# Patient Record
Sex: Female | Born: 1980 | Race: Black or African American | Hispanic: No | Marital: Single | Smoking: Former smoker
Health system: Southern US, Community
[De-identification: ages and names within clinical notes are randomized; demographics above are authoritative.]

## PROBLEM LIST (undated history)

## (undated) DIAGNOSIS — E11610 Type 2 diabetes mellitus with diabetic neuropathic arthropathy: Secondary | ICD-10-CM

## (undated) DIAGNOSIS — I10 Essential (primary) hypertension: Secondary | ICD-10-CM

## (undated) DIAGNOSIS — E785 Hyperlipidemia, unspecified: Secondary | ICD-10-CM

## (undated) DIAGNOSIS — F319 Bipolar disorder, unspecified: Secondary | ICD-10-CM

## (undated) DIAGNOSIS — F1210 Cannabis abuse, uncomplicated: Secondary | ICD-10-CM

## (undated) DIAGNOSIS — E872 Acidosis: Secondary | ICD-10-CM

## (undated) DIAGNOSIS — R112 Nausea with vomiting, unspecified: Secondary | ICD-10-CM

## (undated) DIAGNOSIS — E271 Primary adrenocortical insufficiency: Secondary | ICD-10-CM

## (undated) DIAGNOSIS — Z9889 Other specified postprocedural states: Secondary | ICD-10-CM

## (undated) DIAGNOSIS — E119 Type 2 diabetes mellitus without complications: Secondary | ICD-10-CM

## (undated) DIAGNOSIS — K3184 Gastroparesis: Secondary | ICD-10-CM

## (undated) DIAGNOSIS — E8729 Other acidosis: Secondary | ICD-10-CM

## (undated) HISTORY — DX: Essential (primary) hypertension: I10

## (undated) HISTORY — DX: Type 2 diabetes mellitus with diabetic neuropathic arthropathy: E11.610

## (undated) HISTORY — PX: ABDOMINAL HYSTERECTOMY: SHX81

## (undated) HISTORY — PX: ABCESS DRAINAGE: SHX399

## (undated) HISTORY — DX: Hyperlipidemia, unspecified: E78.5

---
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
## 2005-07-19 ENCOUNTER — Emergency Department: Payer: Self-pay | Admitting: Emergency Medicine

## 2009-08-20 DIAGNOSIS — F319 Bipolar disorder, unspecified: Secondary | ICD-10-CM

## 2009-08-20 DIAGNOSIS — E271 Primary adrenocortical insufficiency: Secondary | ICD-10-CM

## 2009-08-20 HISTORY — DX: Primary adrenocortical insufficiency: E27.1

## 2009-08-20 HISTORY — DX: Bipolar disorder, unspecified: F31.9

## 2010-12-01 ENCOUNTER — Inpatient Hospital Stay: Payer: Self-pay | Admitting: Internal Medicine

## 2010-12-06 LAB — PATHOLOGY REPORT

## 2012-08-18 ENCOUNTER — Ambulatory Visit: Payer: Self-pay | Admitting: Emergency Medicine

## 2012-08-18 LAB — URINALYSIS, COMPLETE
Bilirubin,UR: NEGATIVE
Blood: NEGATIVE
Leukocyte Esterase: NEGATIVE
Nitrite: NEGATIVE
Specific Gravity: 1.02 (ref 1.003–1.030)

## 2013-02-22 ENCOUNTER — Ambulatory Visit: Payer: Self-pay

## 2015-01-07 ENCOUNTER — Other Ambulatory Visit: Payer: Self-pay | Admitting: Family Medicine

## 2015-01-31 ENCOUNTER — Other Ambulatory Visit: Payer: Self-pay | Admitting: Family Medicine

## 2015-01-31 ENCOUNTER — Ambulatory Visit
Admission: RE | Admit: 2015-01-31 | Discharge: 2015-01-31 | Disposition: A | Discharge status: Home / Self Care | Payer: Medicare Other | Source: Ambulatory Visit | Attending: Family Medicine | Admitting: Family Medicine

## 2015-01-31 DIAGNOSIS — M7989 Other specified soft tissue disorders: Secondary | ICD-10-CM | POA: Diagnosis present

## 2015-01-31 DIAGNOSIS — R600 Localized edema: Secondary | ICD-10-CM | POA: Insufficient documentation

## 2015-01-31 DIAGNOSIS — R609 Edema, unspecified: Secondary | ICD-10-CM

## 2015-01-31 DIAGNOSIS — R52 Pain, unspecified: Secondary | ICD-10-CM

## 2015-01-31 DIAGNOSIS — M79604 Pain in right leg: Secondary | ICD-10-CM | POA: Diagnosis present

## 2015-02-04 ENCOUNTER — Encounter: Payer: Medicare Other | Admitting: *Deleted

## 2015-03-15 ENCOUNTER — Inpatient Hospital Stay: Payer: Medicare Other

## 2015-03-15 ENCOUNTER — Inpatient Hospital Stay
Admission: EM | Admit: 2015-03-15 | Discharge: 2015-03-18 | DRG: 638 | Disposition: A | Discharge status: Home / Self Care | Payer: Medicare Other | Attending: Internal Medicine | Admitting: Internal Medicine

## 2015-03-15 DIAGNOSIS — F319 Bipolar disorder, unspecified: Secondary | ICD-10-CM | POA: Diagnosis present

## 2015-03-15 DIAGNOSIS — F1210 Cannabis abuse, uncomplicated: Secondary | ICD-10-CM | POA: Diagnosis present

## 2015-03-15 DIAGNOSIS — K3184 Gastroparesis: Secondary | ICD-10-CM | POA: Diagnosis present

## 2015-03-15 DIAGNOSIS — Z79899 Other long term (current) drug therapy: Secondary | ICD-10-CM | POA: Diagnosis not present

## 2015-03-15 DIAGNOSIS — B9620 Unspecified Escherichia coli [E. coli] as the cause of diseases classified elsewhere: Secondary | ICD-10-CM | POA: Diagnosis present

## 2015-03-15 DIAGNOSIS — Z452 Encounter for adjustment and management of vascular access device: Secondary | ICD-10-CM

## 2015-03-15 DIAGNOSIS — E1169 Type 2 diabetes mellitus with other specified complication: Secondary | ICD-10-CM

## 2015-03-15 DIAGNOSIS — E1310 Other specified diabetes mellitus with ketoacidosis without coma: Secondary | ICD-10-CM | POA: Diagnosis present

## 2015-03-15 DIAGNOSIS — N390 Urinary tract infection, site not specified: Secondary | ICD-10-CM | POA: Diagnosis present

## 2015-03-15 DIAGNOSIS — E1110 Type 2 diabetes mellitus with ketoacidosis without coma: Secondary | ICD-10-CM | POA: Diagnosis present

## 2015-03-15 HISTORY — DX: Bipolar disorder, unspecified: F31.9

## 2015-03-15 HISTORY — DX: Gastroparesis: K31.84

## 2015-03-15 HISTORY — DX: Type 2 diabetes mellitus without complications: E11.9

## 2015-03-15 HISTORY — DX: Cannabis abuse, uncomplicated: F12.10

## 2015-03-15 LAB — COMPREHENSIVE METABOLIC PANEL
ALT: 15 U/L (ref 14–54)
AST: 19 U/L (ref 15–41)
Albumin: 3.6 g/dL (ref 3.5–5.0)
Alkaline Phosphatase: 82 U/L (ref 38–126)
Anion gap: 17 — ABNORMAL HIGH (ref 5–15)
BUN: 12 mg/dL (ref 6–20)
CO2: 20 mmol/L — ABNORMAL LOW (ref 22–32)
CREATININE: 0.88 mg/dL (ref 0.44–1.00)
Calcium: 9 mg/dL (ref 8.9–10.3)
Chloride: 98 mmol/L — ABNORMAL LOW (ref 101–111)
GFR calc Af Amer: >60 mL/min (ref >60)
GFR calc non Af Amer: >60 mL/min (ref >60)
GLUCOSE: 377 mg/dL — AB (ref 65–99)
Potassium: 4.1 mmol/L (ref 3.5–5.1)
Sodium: 135 mmol/L (ref 135–145)
Total Bilirubin: 0.8 mg/dL (ref 0.3–1.2)
Total Protein: 7.9 g/dL (ref 6.5–8.1)

## 2015-03-15 LAB — BASIC METABOLIC PANEL
ANION GAP: 19 — AB (ref 5–15)
ANION GAP: 11 (ref 5–15)
BUN: 12 mg/dL (ref 6–20)
BUN: 12 mg/dL (ref 6–20)
CHLORIDE: 100 mmol/L — AB (ref 101–111)
CO2: 17 mmol/L — AB (ref 22–32)
CO2: 21 mmol/L — ABNORMAL LOW (ref 22–32)
Calcium: 9 mg/dL (ref 8.9–10.3)
Calcium: 8.8 mg/dL — ABNORMAL LOW (ref 8.9–10.3)
Chloride: 108 mmol/L (ref 101–111)
Creatinine, Ser: 0.87 mg/dL (ref 0.44–1.00)
Creatinine, Ser: 0.89 mg/dL (ref 0.44–1.00)
GFR calc Af Amer: >60 mL/min (ref >60)
GFR calc non Af Amer: >60 mL/min (ref >60)
GLUCOSE: 386 mg/dL — AB (ref 65–99)
Glucose, Bld: 242 mg/dL — ABNORMAL HIGH (ref 65–99)
POTASSIUM: 4.2 mmol/L (ref 3.5–5.1)
Potassium: 4.3 mmol/L (ref 3.5–5.1)
SODIUM: 136 mmol/L (ref 135–145)
Sodium: 140 mmol/L (ref 135–145)

## 2015-03-15 LAB — BLOOD GAS, VENOUS
Acid-base deficit: 6.2 mmol/L — ABNORMAL HIGH (ref 0.0–2.0)
Bicarbonate: 19 mEq/L — ABNORMAL LOW (ref 21.0–28.0)
FIO2: 0.21
Patient temperature: 37
pCO2, Ven: 36 mmHg — ABNORMAL LOW (ref 44.0–60.0)
pH, Ven: 7.33 (ref 7.320–7.430)

## 2015-03-15 LAB — URINALYSIS COMPLETE WITH MICROSCOPIC (ARMC ONLY)
Bacteria, UA: NONE SEEN
Bilirubin Urine: NEGATIVE
Glucose, UA: >500 mg/dL — AB
Nitrite: NEGATIVE
Protein, ur: 30 mg/dL — AB
Specific Gravity, Urine: 1.017 (ref 1.005–1.030)
pH: 7 (ref 5.0–8.0)

## 2015-03-15 LAB — CBC
HEMATOCRIT: 38.1 % (ref 35.0–47.0)
Hemoglobin: 12.5 g/dL (ref 12.0–16.0)
MCH: 28.9 pg (ref 26.0–34.0)
MCHC: 32.8 g/dL (ref 32.0–36.0)
MCV: 88.1 fL (ref 80.0–100.0)
Platelets: 293 10*3/uL (ref 150–440)
RBC: 4.33 MIL/uL (ref 3.80–5.20)
RDW: 12.9 % (ref 11.5–14.5)
WBC: 18.9 10*3/uL — ABNORMAL HIGH (ref 3.6–11.0)

## 2015-03-15 LAB — URINE DRUG SCREEN, QUALITATIVE (ARMC ONLY)
AMPHETAMINES, UR SCREEN: NOT DETECTED
BARBITURATES, UR SCREEN: NOT DETECTED
Benzodiazepine, Ur Scrn: NOT DETECTED
Cannabinoid 50 Ng, Ur ~~LOC~~: POSITIVE — AB
Cocaine Metabolite,Ur ~~LOC~~: NOT DETECTED
MDMA (Ecstasy)Ur Screen: NOT DETECTED
METHADONE SCREEN, URINE: NOT DETECTED
Opiate, Ur Screen: NOT DETECTED
PHENCYCLIDINE (PCP) UR S: NOT DETECTED
Tricyclic, Ur Screen: NOT DETECTED

## 2015-03-15 LAB — GLUCOSE, CAPILLARY
GLUCOSE-CAPILLARY: 197 mg/dL — AB (ref 65–99)
GLUCOSE-CAPILLARY: 320 mg/dL — AB (ref 65–99)
Glucose-Capillary: 348 mg/dL — ABNORMAL HIGH (ref 65–99)
Glucose-Capillary: 373 mg/dL — ABNORMAL HIGH (ref 65–99)
Glucose-Capillary: 340 mg/dL — ABNORMAL HIGH (ref 65–99)
Glucose-Capillary: 261 mg/dL — ABNORMAL HIGH (ref 65–99)
Glucose-Capillary: 236 mg/dL — ABNORMAL HIGH (ref 65–99)
Glucose-Capillary: 215 mg/dL — ABNORMAL HIGH (ref 65–99)

## 2015-03-15 LAB — LACTIC ACID, PLASMA
Lactic Acid, Venous: 1.6 mmol/L (ref 0.5–2.0)
Lactic Acid, Venous: 2.1 mmol/L (ref 0.5–2.0)

## 2015-03-15 LAB — LIPASE, BLOOD: Lipase: 15 U/L — ABNORMAL LOW (ref 22–51)

## 2015-03-15 LAB — PREGNANCY, URINE: Preg Test, Ur: NEGATIVE

## 2015-03-15 MED ORDER — SODIUM CHLORIDE 0.9 % IJ SOLN
3.0000 mL | Freq: Two times a day (BID) | INTRAMUSCULAR | Status: DC
  Administered 2015-03-15 – 2015-03-17 (×6): 3 mL via INTRAVENOUS

## 2015-03-15 MED ORDER — SODIUM CHLORIDE 0.9 % IV SOLN
1000.0000 mL | Freq: Once | INTRAVENOUS | Status: AC
  Administered 2015-03-15: 1000 mL via INTRAVENOUS

## 2015-03-15 MED ORDER — LISINOPRIL 5 MG PO TABS
5.0000 mg | ORAL_TABLET | Freq: Every day | ORAL | Status: DC
Start: 2015-03-16 — End: 2015-03-18
  Administered 2015-03-17: 5 mg via ORAL
  Filled 2015-03-15: qty 1

## 2015-03-15 MED ORDER — DEXTROSE 5 % IV SOLN
1.0000 g | INTRAVENOUS | Status: DC
  Administered 2015-03-16 – 2015-03-18 (×3): 1 g via INTRAVENOUS
  Filled 2015-03-15 (×5): qty 10

## 2015-03-15 MED ORDER — CIPROFLOXACIN HCL 500 MG PO TABS: 500.0000 mg | ORAL_TABLET | Freq: Two times a day (BID) | ORAL | Status: DC

## 2015-03-15 MED ORDER — OXYCODONE HCL 5 MG PO TABS
5.0000 mg | ORAL_TABLET | ORAL | Status: DC | PRN
  Administered 2015-03-16: 5 mg via ORAL
  Filled 2015-03-15: qty 1

## 2015-03-15 MED ORDER — SODIUM CHLORIDE 0.9 % IV BOLUS (SEPSIS)
1000.0000 mL | INTRAVENOUS | Status: AC
  Administered 2015-03-15: 1000 mL via INTRAVENOUS

## 2015-03-15 MED ORDER — SODIUM CHLORIDE 0.9 % IV BOLUS (SEPSIS)
1000.0000 mL | Freq: Once | INTRAVENOUS | Status: AC
  Administered 2015-03-15: 1000 mL via INTRAVENOUS

## 2015-03-15 MED ORDER — DEXTROSE-NACL 5-0.45 % IV SOLN
INTRAVENOUS | Status: DC
  Administered 2015-03-15: 22:00:00 via INTRAVENOUS

## 2015-03-15 MED ORDER — ACETAMINOPHEN 325 MG PO TABS
650.0000 mg | ORAL_TABLET | Freq: Four times a day (QID) | ORAL | Status: DC | PRN
Start: 2015-03-15 — End: 2015-03-18

## 2015-03-15 MED ORDER — LORAZEPAM 2 MG/ML IJ SOLN
0.5000 mg | Freq: Once | INTRAMUSCULAR | Status: AC
  Administered 2015-03-15: 0.5 mg via INTRAVENOUS
  Filled 2015-03-15: qty 1

## 2015-03-15 MED ORDER — METOCLOPRAMIDE HCL 5 MG/ML IJ SOLN
10.0000 mg | Freq: Once | INTRAMUSCULAR | Status: AC
  Administered 2015-03-15 (×2): 10 mg via INTRAVENOUS
  Filled 2015-03-15: qty 2

## 2015-03-15 MED ORDER — METOCLOPRAMIDE HCL 5 MG/ML IJ SOLN
INTRAMUSCULAR | Status: AC
  Administered 2015-03-15: 10 mg via INTRAVENOUS
  Filled 2015-03-15: qty 2

## 2015-03-15 MED ORDER — ACETAMINOPHEN 650 MG RE SUPP
650.0000 mg | Freq: Four times a day (QID) | RECTAL | Status: DC | PRN
Start: 2015-03-15 — End: 2015-03-18

## 2015-03-15 MED ORDER — METOCLOPRAMIDE HCL 5 MG/ML IJ SOLN: 10.0000 mg | Freq: Once | INTRAMUSCULAR | Status: AC

## 2015-03-15 MED ORDER — MORPHINE SULFATE 2 MG/ML IJ SOLN
2.0000 mg | INTRAMUSCULAR | Status: DC | PRN
  Administered 2015-03-15 – 2015-03-17 (×9): 2 mg via INTRAVENOUS
  Filled 2015-03-15 (×9): qty 1

## 2015-03-15 MED ORDER — METOCLOPRAMIDE HCL 5 MG/ML IJ SOLN
10.0000 mg | Freq: Four times a day (QID) | INTRAMUSCULAR | Status: DC | PRN
  Administered 2015-03-15 – 2015-03-17 (×4): 10 mg via INTRAVENOUS
  Filled 2015-03-15 (×4): qty 2

## 2015-03-15 MED ORDER — CEFTRIAXONE SODIUM 1 G IJ SOLR
1.0000 g | Freq: Once | INTRAMUSCULAR | Status: AC
  Administered 2015-03-15: 1 g via INTRAVENOUS

## 2015-03-15 MED ORDER — SODIUM CHLORIDE 0.9 % IV SOLN
INTRAVENOUS | Status: DC
  Administered 2015-03-15: 17:00:00 via INTRAVENOUS

## 2015-03-15 MED ORDER — SODIUM CHLORIDE 0.9 % IV SOLN
INTRAVENOUS | Status: DC
  Administered 2015-03-15: 18:00:00 via INTRAVENOUS

## 2015-03-15 MED ORDER — SODIUM CHLORIDE 0.9 % IV SOLN: INTRAVENOUS | Status: DC

## 2015-03-15 MED ORDER — ENOXAPARIN SODIUM 40 MG/0.4ML ~~LOC~~ SOLN
40.0000 mg | Freq: Two times a day (BID) | SUBCUTANEOUS | Status: DC
  Administered 2015-03-15 – 2015-03-18 (×5): 40 mg via SUBCUTANEOUS
  Filled 2015-03-15 (×5): qty 0.4

## 2015-03-15 MED ORDER — NORELGESTROMIN-ETH ESTRADIOL 150-35 MCG/24HR TD PTWK: 1.0000 | MEDICATED_PATCH | TRANSDERMAL | Status: DC

## 2015-03-15 MED ORDER — POTASSIUM CHLORIDE 10 MEQ/100ML IV SOLN
10.0000 meq | INTRAVENOUS | Status: AC
  Administered 2015-03-15 (×3): 10 meq via INTRAVENOUS
  Filled 2015-03-15 (×4): qty 100

## 2015-03-15 MED ORDER — SODIUM CHLORIDE 0.9 % IV SOLN
INTRAVENOUS | Status: DC
  Administered 2015-03-15: 17:00:00 via INTRAVENOUS
  Administered 2015-03-16: 5.1 [IU]/h via INTRAVENOUS
  Filled 2015-03-15: qty 2.5

## 2015-03-15 MED ORDER — ONDANSETRON 4 MG PO TBDP: 4.0000 mg | ORAL_TABLET | Freq: Three times a day (TID) | ORAL | Status: DC | PRN

## 2015-03-15 MED ORDER — DEXTROSE 5 % IV SOLN
INTRAVENOUS | Status: AC
  Administered 2015-03-15: 1 g via INTRAVENOUS
  Filled 2015-03-15: qty 10

## 2015-03-15 NOTE — Discharge Instructions (Signed)

## 2015-03-15 NOTE — Progress Notes (Signed)
Central line cleared for use at per Dr. Juliann Pulse.

## 2015-03-15 NOTE — Progress Notes (Signed)
Patient with limited IV access.  Dr. Elpidio Anis notified.

## 2015-03-15 NOTE — H&P (Signed)
[facility] - [place] at [facility]   PATIENT NAME: Laura Escobar    MR#:  [id number]  DATE OF BIRTH:  [birth year]  DATE OF ADMISSION:  03/15/2015  PRIMARY CARE PHYSICIAN: Berneice Gandy, MD   REQUESTING/REFERRING PHYSICIAN: Dr. Cyril Loosen  CHIEF COMPLAINT:   Chief Complaint  Patient presents with  . Abdominal Pain    Nausea/vomiting/cramps. Patient states she "started metformin on friday and it set off my gastroparesis"    HISTORY OF PRESENT ILLNESS:  Melainie Krinsky  is a 34 y.o. female with a known history of diabetes mellitus type 2 requiring insulin, gastroparesis, bipolar disorder presents to the emergency room with complaints of nausea, vomiting, abdominal pain since Thursday. Patient mentions that this feels very similar to an year prior when she had gastroparesis attack and was admitted to [facility]. She has done well since then. She stopped taking her insulin since Thursday as she has been unable to eat. Blood sugars have been running high. In the emergency room she has been found to have a UTI, DKA and is being admitted to the hospital. She normally takes 25 units of NPH 2 times a day. And 6 units of regular insulin with meals.  PAST MEDICAL HISTORY:   Past Medical History  Diagnosis Date  . Gastroparesis   . Diabetes mellitus without complication   . Bipolar 1 disorder   . Marijuana abuse     PAST SURGICAL HISTORY:  History reviewed. No pertinent past surgical history.  SOCIAL HISTORY:   History  Substance Use Topics  . Smoking status: Not on file  . Smokeless tobacco: Not on file  . Alcohol Use: Not on file    FAMILY HISTORY:   Family History  Problem Relation Age of Onset  . Diabetes Mother     DRUG ALLERGIES:  No Known Allergies  REVIEW OF SYSTEMS:   Review of Systems  Constitutional: Positive for malaise/fatigue. Negative for fever, chills and weight loss.  HENT: Negative for hearing loss and nosebleeds.    Eyes: Negative for blurred vision, double vision and pain.  Respiratory: Negative for cough, hemoptysis, sputum production, shortness of breath and wheezing.   Cardiovascular: Negative for chest pain, palpitations, orthopnea and leg swelling.  Gastrointestinal: Positive for nausea, vomiting and abdominal pain. Negative for diarrhea and constipation.  Genitourinary: Negative for dysuria and hematuria.  Musculoskeletal: Negative for myalgias, back pain and falls.  Skin: Negative for rash.  Neurological: Positive for weakness. Negative for dizziness, tremors, sensory change, speech change, focal weakness, seizures and headaches.  Endo/Heme/Allergies: Does not bruise/bleed easily.  Psychiatric/Behavioral: Positive for depression. Negative for memory loss. The patient is nervous/anxious.     MEDICATIONS AT HOME:   Prior to Admission medications   Medication Sig Start Date End Date Taking? Authorizing Provider  canagliflozin (INVOKANA) 100 MG TABS tablet Take 100 mg by mouth daily.   Yes Historical Provider, MD  furosemide (LASIX) 40 MG tablet Take 60 mg by mouth daily.   Yes Historical Provider, MD  glimepiride (AMARYL) 1 MG tablet Take 1 mg by mouth daily with breakfast.   Yes Historical Provider, MD  insulin NPH Human (HUMULIN N,NOVOLIN N) 100 UNIT/ML injection Inject 27 Units into the skin daily before breakfast.   Yes Historical Provider, MD  insulin NPH Human (HUMULIN N,NOVOLIN N) 100 UNIT/ML injection Inject 25 Units into the skin every evening.   Yes Historical Provider, MD  insulin regular (NOVOLIN R,HUMULIN R) 100 units/mL injection Inject 6 Units into the  skin 2 (two) times daily before a meal. Inject 6 units 30 minutes before lunch and dinner. May adjust as needed depending on blood sugar.   Yes Historical Provider, MD  lisinopril (PRINIVIL,ZESTRIL) 5 MG tablet Take 5 mg by mouth daily.   Yes Historical Provider, MD  norelgestromin-ethinyl estradiol (ORTHO EVRA) 150-35 MCG/24HR  transdermal patch Place 1 patch onto the skin once a week.   Yes Historical Provider, MD  ciprofloxacin (CIPRO) 500 MG tablet Take 1 tablet (500 mg total) by mouth 2 (two) times daily. 03/15/15   Jene Every, MD  ondansetron (ZOFRAN ODT) 4 MG disintegrating tablet Take 1 tablet (4 mg total) by mouth every 8 (eight) hours as needed for nausea or vomiting. 03/15/15   Jene Every, MD      VITAL SIGNS:  Blood pressure 183/97, pulse 98, temperature 97.9 F (36.6 C), temperature source Oral, resp. rate 26, height 5\' 7"  (1.702 m), weight 139.708 kg (308 lb), last menstrual period 02/28/2015, SpO2 98 %.  PHYSICAL EXAMINATION:  Physical Exam  GENERAL:  33 y.o.-year-old patient lying in the bed with no acute distress.  EYES: Pupils equal, round, reactive to light and accommodation. No scleral icterus. Extraocular muscles intact.  HEENT: Head atraumatic, normocephalic. Oropharynx and nasopharynx clear. No oropharyngeal erythema, Dry oral mucosa  NECK:  Supple, no jugular venous distention. No thyroid enlargement, no tenderness.  LUNGS: Normal breath sounds bilaterally, no wheezing, rales, rhonchi. No use of accessory muscles of respiration.  CARDIOVASCULAR: S1, S2 normal. No murmurs, rubs, or gallops. Tachycardia. ABDOMEN: Soft, nondistended. Bowel sounds present. No organomegaly or mass. Diffuse tenderness. EXTREMITIES: No pedal edema, cyanosis, or clubbing. + 2 pedal & radial pulses b/l.   NEUROLOGIC: Cranial nerves II through XII are intact. No focal Motor or sensory deficits appreciated b/l PSYCHIATRIC: The patient is alert and oriented x 3. Good affect.  SKIN: No obvious rash, lesion, or ulcer.   LABORATORY PANEL:   CBC  Recent Labs Lab 03/15/15 1230  WBC 18.9*  HGB 12.5  HCT 38.1  PLT 293   ------------------------------------------------------------------------------------------------------------------  Chemistries   Recent Labs Lab 03/15/15 1230  NA 135  K 4.1  CL 98*   CO2 20*  GLUCOSE 377*  BUN 12  CREATININE 0.88  CALCIUM 9.0  AST 19  ALT 15  ALKPHOS 82  BILITOT 0.8   ------------------------------------------------------------------------------------------------------------------  Cardiac Enzymes No results for input(s): TROPONINI in the last 168 hours. ------------------------------------------------------------------------------------------------------------------  RADIOLOGY:  No results found.   IMPRESSION AND PLAN:   * Diabetic ketoacidosis Due to patient missing her insulin for 4 days now. Bolus normal saline. Continue maintenance normal saline. Start on IV insulin with every hour Accu-Cheks. Admit to stepdown unit. We'll transition to D5 normal saline once blood sugars are less than 250. Check BMP every 4 hours. We will transition to her home dose of insulin once her DKA has resolved. Nothing by mouth. Counseled to not stop her insulin in the future to prevent DKA.  * UTI with sepsis Start IV ceftriaxone. We wait for urine cultures. Likely cause of onset of the DKA. Patient receiving IV fluid boluses. Check lactic acid.  * Gastroparesis Start IV Reglan.  * DVT prophylaxis with Lovenox.  All the records are reviewed and case discussed with ED provider. Management plans discussed with the patient, family and they are in agreement.  CODE STATUS: FULL  TOTAL CC TIME TAKING CARE OF THIS PATIENT: 40 minutes.    Milagros Loll R M.D on 03/15/2015 at 3:29 PM  Between 7am to 6pm - Pager - [phone]  After 6pm go to [email or web address] - password [email or web address] [facility]  [facility]  Office  [phone]  CC: Primary care physician; Berneice Gandy, MD

## 2015-03-15 NOTE — Op Note (Signed)
Preoperative diagnosis: DKA Postoperative diagnosis: DKA  Procedure performed: Ultrasound guided right internal jugular vein central line placement, posterior approach Anesthesia: none Complications: None  Indication for surgery:  Ms. Grieshop is a pleasant 34 yo F who presented with DKA.  I was consulted for central line placement.   Details of procedure:  Informed consent was obtained.  Right neck was prepped and draped.  Timeout was performed.  An ultrasound was used to visualize the right IJ vein.  It was easily collapsable due to patient volume status and I was able to isolate it using a posterior approach with the access needle.  The wire was then passed through the needle without difficulty.  The tract was dilated up and the central venous catheter was placed over the wire.  The wire was withdrawn.  All 3 ports drew and flushed easily.  The IV was sutured in place and a sterile dressing was placed over it.  There were no immediate complications.  Needle, sponge and instrument count was correct at the end of the procedure.  Awaiting chest xray to confirm placement without complication.

## 2015-03-15 NOTE — ED Provider Notes (Signed)
[facility] Emergency Department Provider Note  ____________________________________________  Time seen: On arrival, via EMS  I have reviewed the triage vital signs and the nursing notes.   HISTORY  Chief Complaint Abdominal Pain    HPI Laura Escobar is a 34 y.o. female who presents with crampy abdominal pain diffusely and nausea and vomiting. Patient was sent from her PCPs office via EMS for evaluation. Patient wears a history of gastroparesis and she says this feels similar. She complains of pain is moderate and the nausea and vomiting as the most severe. She denies diarrhea. No rigors or chills, no fever. As episode feels similar to past episodes. She reports Reglan helps with her nausea typically.     Past Medical History  Diagnosis Date  . Gastroparesis   . Diabetes mellitus without complication   . Bipolar 1 disorder     There are no active problems to display for this patient.   No past surgical history on file.  Current Outpatient Rx  Name  Route  Sig  Dispense  Refill  . canagliflozin (INVOKANA) 100 MG TABS tablet   Oral   Take 100 mg by mouth daily.         . furosemide (LASIX) 40 MG tablet   Oral   Take 60 mg by mouth daily.         Marland Kitchen glimepiride (AMARYL) 1 MG tablet   Oral   Take 1 mg by mouth daily with breakfast.         . insulin NPH Human (HUMULIN N,NOVOLIN N) 100 UNIT/ML injection   Subcutaneous   Inject 27 Units into the skin daily before breakfast.         . insulin NPH Human (HUMULIN N,NOVOLIN N) 100 UNIT/ML injection   Subcutaneous   Inject 25 Units into the skin every evening.         . insulin regular (NOVOLIN R,HUMULIN R) 100 units/mL injection   Subcutaneous   Inject 6 Units into the skin 2 (two) times daily before a meal. Inject 6 units 30 minutes before lunch and dinner. May adjust as needed depending on blood sugar.         . lisinopril (PRINIVIL,ZESTRIL) 5 MG tablet   Oral   Take 5 mg by  mouth daily.         . norelgestromin-ethinyl estradiol (ORTHO EVRA) 150-35 MCG/24HR transdermal patch   Transdermal   Place 1 patch onto the skin once a week.         . ciprofloxacin (CIPRO) 500 MG tablet   Oral   Take 1 tablet (500 mg total) by mouth 2 (two) times daily.   14 tablet   0   . ondansetron (ZOFRAN ODT) 4 MG disintegrating tablet   Oral   Take 1 tablet (4 mg total) by mouth every 8 (eight) hours as needed for nausea or vomiting.   20 tablet   0     Allergies Review of patient's allergies indicates no known allergies.  No family history on file.  Social History History  Substance Use Topics  . Smoking status: Not on file  . Smokeless tobacco: Not on file  . Alcohol Use: Not on file    Review of Systems  Constitutional: Negative for fever. Eyes: Negative for visual changes. ENT: Negative for sore throat Cardiovascular: Negative for chest pain. Respiratory: Negative for shortness of breath. Gastrointestinal: As above Genitourinary: Negative for dysuria. Musculoskeletal: Negative for back pain. Skin: Negative for  rash. Neurological: Negative for headaches or focal weakness Psychiatric: Mild anxiety  10-point ROS otherwise negative.  ____________________________________________   PHYSICAL EXAM:  VITAL SIGNS: ED Triage Vitals  Enc Vitals Group     BP 03/15/15 0941 149/90 mmHg     Pulse Rate 03/15/15 0941 90     Resp --      Temp 03/15/15 0941 97.9 F (36.6 C)     Temp Source 03/15/15 0941 Oral     SpO2 03/15/15 0941 98 %     Weight 03/15/15 0941 308 lb (139.708 kg)     Height 03/15/15 0941 5\' 7"  (1.702 m)     Head Cir --      Peak Flow --      Pain Score 03/15/15 0946 10     Pain Loc --      Pain Edu? --      Excl. in GC? --      Constitutional: Alert and oriented. Well appearing and in no distress. Eyes: Conjunctivae are normal.  ENT   Head: Normocephalic and atraumatic.   Mouth/Throat: Mucous membranes are  moist. Cardiovascular: Normal rate, regular rhythm. Normal and symmetric distal pulses are present in all extremities. No murmurs, rubs, or gallops. Respiratory: Normal respiratory effort without tachypnea nor retractions. Breath sounds are clear and equal bilaterally.  Gastrointestinal: Soft and non-tender in all quadrants. No distention. There is no CVA tenderness. Genitourinary: deferred Musculoskeletal: Nontender with normal range of motion in all extremities. No lower extremity tenderness nor edema. Neurologic:  Normal speech and language. No gross focal neurologic deficits are appreciated. Skin:  Skin is warm, dry and intact. No rash noted.   ____________________________________________    LABS (pertinent positives/negatives)  Labs Reviewed  CBC - Abnormal; Notable for the following:    WBC 18.9 (*)    All other components within normal limits  COMPREHENSIVE METABOLIC PANEL - Abnormal; Notable for the following:    Chloride 98 (*)    CO2 20 (*)    Glucose, Bld 377 (*)    Anion gap 17 (*)    All other components within normal limits  LIPASE, BLOOD - Abnormal; Notable for the following:    Lipase 15 (*)    All other components within normal limits  URINALYSIS COMPLETEWITH MICROSCOPIC ([facility] ONLY) - Abnormal; Notable for the following:    Color, Urine YELLOW (*)    APPearance CLOUDY (*)    Glucose, UA >500 (*)    Ketones, ur 1+ (*)    Hgb urine dipstick 2+ (*)    Protein, ur 30 (*)    Leukocytes, UA 3+ (*)    Squamous Epithelial / LPF 6-30 (*)    All other components within normal limits  PREGNANCY, URINE    ____________________________________________   EKG  None  ____________________________________________    RADIOLOGY I have personally reviewed any xrays that were ordered on this patient: None  ____________________________________________   PROCEDURES  Procedure(s) performed: none  Critical Care performed: yes CRITICAL CARE Performed by: Jene Every   Total critical care time: 30  Critical care time was exclusive of separately billable procedures and treating other patients.  Critical care was necessary to treat or prevent imminent or life-threatening deterioration.  Critical care was time spent personally by me on the following activities: development of treatment plan with patient and/or surrogate as well as nursing, discussions with consultants, evaluation of patient's response to treatment, examination of patient, obtaining history from patient or surrogate, ordering and performing treatments  and interventions, ordering and review of laboratory studies, ordering and review of radiographic studies, pulse oximetry and re-evaluation of patient's condition.   ____________________________________________   INITIAL IMPRESSION / ASSESSMENT AND PLAN / ED COURSE  Pertinent labs & imaging results that were available during my care of the patient were reviewed by me and considered in my medical decision making (see chart for details).  Patient sent from primary care office with some lethargy, nausea and vomiting with elevated blood glucose. He has ketones in her urine as well as evidence of urinary tract infection and elevated anion gap and turning for DKA. We will give normal saline 2 L in the emergency department IV Reglan and admit the patient for further workup.   ____________________________________________   FINAL CLINICAL IMPRESSION(S) / ED DIAGNOSES  Final diagnoses:  UTI (lower urinary tract infection)   diabetic ketoacidosis   Jene Every, MD 03/15/15 1457

## 2015-03-15 NOTE — ED Notes (Signed)
Went to [facility] and they called EMS. Blood sugar at office 390.

## 2015-03-15 NOTE — Progress Notes (Signed)
Dr. Juliann Pulse at bedside to place central line.

## 2015-03-16 LAB — BASIC METABOLIC PANEL
ANION GAP: 7 (ref 5–15)
Anion gap: 9 (ref 5–15)
BUN: 12 mg/dL (ref 6–20)
BUN: 13 mg/dL (ref 6–20)
CALCIUM: 8.4 mg/dL — AB (ref 8.9–10.3)
CHLORIDE: 106 mmol/L (ref 101–111)
CO2: 25 mmol/L (ref 22–32)
CO2: 24 mmol/L (ref 22–32)
CREATININE: 0.71 mg/dL (ref 0.44–1.00)
CREATININE: 0.73 mg/dL (ref 0.44–1.00)
Calcium: 8.2 mg/dL — ABNORMAL LOW (ref 8.9–10.3)
Chloride: 106 mmol/L (ref 101–111)
GFR calc Af Amer: >60 mL/min (ref >60)
GFR calc non Af Amer: >60 mL/min (ref >60)
Glucose, Bld: 175 mg/dL — ABNORMAL HIGH (ref 65–99)
Glucose, Bld: 104 mg/dL — ABNORMAL HIGH (ref 65–99)
POTASSIUM: 3.5 mmol/L (ref 3.5–5.1)
Potassium: 3.9 mmol/L (ref 3.5–5.1)
SODIUM: 138 mmol/L (ref 135–145)
Sodium: 139 mmol/L (ref 135–145)

## 2015-03-16 LAB — GLUCOSE, CAPILLARY
GLUCOSE-CAPILLARY: 150 mg/dL — AB (ref 65–99)
GLUCOSE-CAPILLARY: 153 mg/dL — AB (ref 65–99)
GLUCOSE-CAPILLARY: 157 mg/dL — AB (ref 65–99)
GLUCOSE-CAPILLARY: 168 mg/dL — AB (ref 65–99)
GLUCOSE-CAPILLARY: 181 mg/dL — AB (ref 65–99)
Glucose-Capillary: 95 mg/dL (ref 65–99)
Glucose-Capillary: 102 mg/dL — ABNORMAL HIGH (ref 65–99)
Glucose-Capillary: 157 mg/dL — ABNORMAL HIGH (ref 65–99)
Glucose-Capillary: 160 mg/dL — ABNORMAL HIGH (ref 65–99)
Glucose-Capillary: 178 mg/dL — ABNORMAL HIGH (ref 65–99)
Glucose-Capillary: 229 mg/dL — ABNORMAL HIGH (ref 65–99)

## 2015-03-16 LAB — MRSA PCR SCREENING: MRSA by PCR: POSITIVE — AB

## 2015-03-16 LAB — LIPASE, BLOOD: Lipase: 17 U/L — ABNORMAL LOW (ref 22–51)

## 2015-03-16 MED ORDER — CHLORHEXIDINE GLUCONATE CLOTH 2 % EX PADS
6.0000 | MEDICATED_PAD | Freq: Every day | CUTANEOUS | Status: DC
  Administered 2015-03-16 – 2015-03-18 (×3): 6 via TOPICAL

## 2015-03-16 MED ORDER — INSULIN GLARGINE 100 UNIT/ML ~~LOC~~ SOLN
52.0000 [IU] | Freq: Every day | SUBCUTANEOUS | Status: DC
  Administered 2015-03-16 – 2015-03-17 (×2): 52 [IU] via SUBCUTANEOUS
  Filled 2015-03-16 (×3): qty 0.52

## 2015-03-16 MED ORDER — MUPIROCIN 2 % EX OINT
1.0000 "application " | TOPICAL_OINTMENT | Freq: Two times a day (BID) | CUTANEOUS | Status: DC
  Administered 2015-03-16 – 2015-03-18 (×4): 1 via NASAL
  Filled 2015-03-16: qty 22

## 2015-03-16 MED ORDER — ONDANSETRON HCL 4 MG/2ML IJ SOLN
4.0000 mg | Freq: Four times a day (QID) | INTRAMUSCULAR | Status: DC | PRN
  Administered 2015-03-16 – 2015-03-18 (×3): 4 mg via INTRAVENOUS
  Filled 2015-03-16 (×3): qty 2

## 2015-03-16 MED ORDER — ERYTHROMYCIN BASE 250 MG PO TABS
250.0000 mg | ORAL_TABLET | Freq: Two times a day (BID) | ORAL | Status: DC
  Administered 2015-03-16 – 2015-03-18 (×4): 250 mg via ORAL
  Filled 2015-03-16 (×5): qty 1

## 2015-03-16 MED ORDER — SODIUM CHLORIDE 0.9 % IV SOLN
INTRAVENOUS | Status: DC
  Administered 2015-03-16 – 2015-03-17 (×2): via INTRAVENOUS

## 2015-03-16 MED ORDER — INSULIN ASPART 100 UNIT/ML ~~LOC~~ SOLN
0.0000 [IU] | Freq: Every day | SUBCUTANEOUS | Status: DC
  Administered 2015-03-16: 2 [IU] via SUBCUTANEOUS

## 2015-03-16 MED ORDER — INSULIN ASPART 100 UNIT/ML ~~LOC~~ SOLN
0.0000 [IU] | Freq: Three times a day (TID) | SUBCUTANEOUS | Status: DC
  Administered 2015-03-16: 3 [IU] via SUBCUTANEOUS
  Administered 2015-03-16: 5 [IU] via SUBCUTANEOUS
  Administered 2015-03-17: 17:00:00 3 [IU] via SUBCUTANEOUS
  Administered 2015-03-17 (×2): 5 [IU] via SUBCUTANEOUS
  Administered 2015-03-18: 3 [IU] via SUBCUTANEOUS
  Filled 2015-03-16: qty 5
  Filled 2015-03-16: qty 3
  Filled 2015-03-16: qty 5
  Filled 2015-03-16: qty 3
  Filled 2015-03-16: qty 5
  Filled 2015-03-16: qty 3
  Filled 2015-03-16: qty 5

## 2015-03-16 MED ORDER — PROMETHAZINE HCL 25 MG/ML IJ SOLN
25.0000 mg | Freq: Four times a day (QID) | INTRAMUSCULAR | Status: DC | PRN
  Administered 2015-03-16 – 2015-03-17 (×5): 25 mg via INTRAVENOUS
  Filled 2015-03-16 (×5): qty 1

## 2015-03-16 MED ORDER — LIVING WELL WITH DIABETES BOOK
Freq: Once | Status: AC
  Administered 2015-03-16: 10:00:00
  Filled 2015-03-16: qty 1

## 2015-03-16 NOTE — Progress Notes (Signed)
Patient is arousable to voice, oriented, sleeping between care. Converted to SQ insulin this morning. Reporting abdominal pain improved with PRN medication. Nausea addressed with PRN medication improved for short intervals throughout shift, MD aware. Patient resting quietly between care. Orders to transfer out of ICU, awaiting bed assignment.

## 2015-03-16 NOTE — Care Management (Signed)
Potential transfer to acute level of care. Changed insulin to SQ today.

## 2015-03-16 NOTE — Progress Notes (Signed)
[facility] - [place] at [facility]   PATIENT NAME: Laura Escobar    MR#:  [id number]  DATE OF BIRTH:  [birth year]  SUBJECTIVE:  CHIEF COMPLAINT:   Chief Complaint  Patient presents with  . Abdominal Pain    Nausea/vomiting/cramps. Patient states she "started metformin on friday and it set off my gastroparesis"   Still have nausea and abdominal pain.  REVIEW OF SYSTEMS:  CONSTITUTIONAL: No fever, fatigue or weakness.  EYES: No blurred or double vision.  EARS, NOSE, AND THROAT: No tinnitus or ear pain.  RESPIRATORY: No cough, shortness of breath, wheezing or hemoptysis.  CARDIOVASCULAR: No chest pain, orthopnea, edema.  GASTROINTESTINAL: positive for nausea, No vomiting, diarrhea , positive for abdominal pain.  GENITOURINARY: No dysuria, hematuria.  ENDOCRINE: No polyuria, nocturia,  HEMATOLOGY: No anemia, easy bruising or bleeding SKIN: No rash or lesion. MUSCULOSKELETAL: No joint pain or arthritis.   NEUROLOGIC: No tingling, numbness, weakness.  PSYCHIATRY: No anxiety or depression.   ROS  DRUG ALLERGIES:  No Known Allergies  VITALS:  Blood pressure 150/91, pulse 110, temperature 98.2 F (36.8 C), temperature source Oral, resp. rate 12, height  (1.702 m), weight 61.372 kg (135 lb 4.8 oz), last menstrual period 02/28/2015, SpO2 100 %.  PHYSICAL EXAMINATION:  GENERAL:  34 y.o.-year-old patient lying in the bed with no acute distress.  EYES: Pupils equal, round, reactive to light and accommodation. No scleral icterus. Extraocular muscles intact.  HEENT: Head atraumatic, normocephalic. Oropharynx and nasopharynx clear.  NECK:  Supple, no jugular venous distention. No thyroid enlargement, no tenderness.  LUNGS: Normal breath sounds bilaterally, no wheezing, rales,rhonchi or crepitation. No use of accessory muscles of respiration.  CARDIOVASCULAR: S1, S2 normal. No murmurs, rubs, or gallops.  ABDOMEN: Soft, nontender, nondistended. Bowel sounds  present. No organomegaly or mass.  EXTREMITIES: No pedal edema, cyanosis, or clubbing.  NEUROLOGIC: Cranial nerves II through XII are intact. Muscle strength 5/5 in all extremities. Sensation intact. Gait not checked.  PSYCHIATRIC: The patient is alert and oriented x 3.  SKIN: No obvious rash, lesion, or ulcer.   Physical Exam LABORATORY PANEL:   CBC  Recent Labs Lab 03/15/15 1230  WBC 18.9*  HGB 12.5  HCT 38.1  PLT 293   ------------------------------------------------------------------------------------------------------------------  Chemistries   Recent Labs Lab 03/15/15 1230  03/16/15 0431  NA 135  < > 139  K 4.1  < > 3.5  CL 98*  < > 106  CO2 20*  < > 24  GLUCOSE 377*  < > 104*  BUN 12  < > 13  CREATININE 0.88  < > 0.73  CALCIUM 9.0  < > 8.4*  AST 19  --   --   ALT 15  --   --   ALKPHOS 82  --   --   BILITOT 0.8  --   --   < > = values in this interval not displayed. ------------------------------------------------------------------------------------------------------------------  Cardiac Enzymes No results for input(s): TROPONINI in the last 168 hours. ------------------------------------------------------------------------------------------------------------------  RADIOLOGY:  Dg Chest Port 1 View  03/15/2015   CLINICAL DATA:  Central venous catheter placement.  EXAM: PORTABLE CHEST - 1 VIEW  COMPARISON:  None.  FINDINGS: The cardiomediastinal silhouette is unremarkable.  A right IJ central venous catheter is noted with tip overlying the superior cavoatrial junction.  There is no evidence of focal airspace disease, pulmonary edema, suspicious pulmonary nodule/mass, pleural effusion, or pneumothorax. No acute bony abnormalities are identified.  IMPRESSION: Right IJ central  venous catheter with tip overlying the superior cavoatrial junction. No evidence of pneumothorax.  No evidence of active cardiopulmonary disease.   Electronically Signed   By: Harmon Pier M.D.    On: 03/15/2015 18:15    ASSESSMENT AND PLAN:   * DKA   Resolved now with Insulin drip, given [place] insulin.   She still can not eat due to pain and nausea.  * Gastroperesis   IV reglan, Phenergan as needed.  Started on Erythromycin for it.   Check Lipase level also.  * UTI  rocephin IV.     All the records are reviewed and case discussed with Care Management/Social Workerr. Management plans discussed with the patient, family and they are in agreement.  CODE STATUS: full  TOTAL TIME TAKING CARE OF THIS PATIENT: 35 minutes.     POSSIBLE D/C IN 1-2 DAYS, DEPENDING ON CLINICAL CONDITION.   Altamese Dilling M.D on 03/16/2015   Between 7am to 6pm - Pager - [phone]  After 6pm go to [email or web address] - password [email or web address] [facility]  [facility]  Office  [phone]  CC: Primary care physician; Berneice Gandy, MD

## 2015-03-16 NOTE — Progress Notes (Signed)
Anion Gap and CO2 WNL.  FSBS stable.  Informed MD.  Rcvd orders for Pharm consult.

## 2015-03-16 NOTE — Progress Notes (Signed)
Noted consult for diabetes coordinator. Went to speak with patient regarding diabetes and glycemic control. Patient is lying in bed completely covered up over her head. Patient did not wake to verbal cue and had to be lightly touched on the shoulder to get her awake at which time she verbally responded but did not make an effort to uncover her head. Asked patient if I could uncover her face and she stated "Yes". Patient kept eyes closed during most of the conversation and she verbally responded minimally to questions asked.   In reviewing the chart, noted in Care Everywhere tab that patient had an initial consult on 03/10/15 with Dr. Kathlene November (Endocrinologist) as she was referred by Dr. Ether Griffins. According to Dr. Ollen Gross note dated 03/10/15 patient has had Type 2 diabetes for at least 16 years. A1C was 10.6% on 03/10/15. Dr. Renae Fickle made the following diabetic medication changes:  -Start Metformin  (1 pill) at bedtime. After 3 days, start taking 1 pill in the morning and 1 pill at night. 7 days after that, continue 1 pill in the morning and increase to 2 pills at night. After 7 more days, increase to 2 pills in the morning and 2 pills at night. Stay at this dose (  twice a day).  -Do not take metformin if you get severely dehydrated and are unable to keep any fluids down. Take it precaution to avoid lactic acidosis or acid buildup in your blood which can be lifethreatening. You may resume metformin once your are able to hydrate normally -Start invokana  pill before breakfast every day. This medication helps lower blood glucose by limiting the kidney to transfer glucose from the urine back into the blood. Common side effects are increased urination, lowering in blood pressure, yeast infection and modest rise in LDL cholesterol. Please drink at least 6-8 cups of noncaffeinated, non sugary beverage preferably water to avoid dehydration. Please call me if you have burning, pain while urination or  develop a rash or itching at the site of urination since they are all signs of yeast infection. I will send prescription of anti-yeast medication namely diflucan to your pharmacy.  -Stop Humulin N and instead: I am starting you on disposable 24 hour insulin pump called the vgo pump. It gives you 1.2units of insulin per hour automatically for 24 hours. You need to change it every 24 hours even if there is left over insulin since it automatically stops delivering insulin after 24 hours In addition, I want you to do 3 for small meals, 4 meals for medium meals, and 5clicks for large meals. Add an additional click for a blood sugar over 200. Remember this is not an automatic feature. 1 click delivers 2 units of insulin. To get the most out of this pump, I highly recommend you check you sugars before each meal. This will help you decide if you need any additional insulin if your sugar is above over which is over 150. You goal glucose before meals should be 80-140 and no more than 150  Your goal glucose 2 hours after meals should be less than 180 You bedtime sugar should be 100-150 Please notify me if you have any sugars less than 80.  Once you get this VGO pump, call our office and we will train you on how to use this. -Check your blood sugar before each meal   Until insulin pump is started: -Continue NPH 25u at night -Do NOT take NPH in the morning -Take 6u of  Novolin R for small meals, 8u for medium meals,and 10u for large meals -Check your blood sugar as much as possible. Check especially at lunch, dinner, and before bed so we can see what your blood sugar does throughout the day. Return to the clinic on August 4th at 11:00 to see Dr. Renae Fickle. Please bring your meter and your VGO  Patient states that she took both the Metformin and Invokana as instructed on Thursday and then she developed nausea and vomiting on Friday. Patient reports that she only took one dose of both the Metformin and Invokana and  has not taken any since becoming sick on Friday. Patient reports that she continued to take her evening dose of NPH 25 units as instructed and she reports that she did not take any NPH in the mornings as instructed by Dr. Renae Fickle. Patient states that she did not take any regular insulin since she was not able to eat. Patient verified plan to start on V-Go insulin pump but she has not receive the pump yet. Patient verified that she has a follow up appointment on August 4th to see Dr. Renae Fickle. Patient reported that she has not checked her glucose at home since Friday. Will plan to follow back up with patient on Thursday when she is more alert and engaged in conversation.  At this time glucose is stable since being transitioned off IV insulin to Levemir 52 units daily, Novolog 0-15 units TID with meals, and Novolog 0-5 units HS. Would recommend changing CBGs and Novolog correction to Q4H since patient states that she is still nauseated and not able to eat. Will continue to follow and make further recommendations as more data is collected.  Thanks, Orlando Penner, RN, MSN, CCRN, CDE Diabetes Coordinator Inpatient Diabetes Program [phone] (Team Pager from 8am to 5pm) [phone] ([place] office) [phone] [facility] office) [phone] [facility] office)

## 2015-03-16 NOTE — Progress Notes (Signed)
Pharmacy consult Insulin drip transition to subcutaneous. Last insulin drip rate ~7 units/hr. Home dose of basal insulin 52 units NPH daily. Patient had stopped taking her home insulin due to decreased PO intake prior to this admission. Per Uptodate for DKA, will start back on home dose basal insulin of 52 units with Lantus(formulary) instead of NPH. Will start on moderate sliding scale while in house.

## 2015-03-16 NOTE — Progress Notes (Signed)
Spoke with Dr Elisabeth Pigeon concerning diabetes consult recommendation to switch patient to q4hour sliding scale coverage. No new orders at this time.

## 2015-03-17 LAB — BASIC METABOLIC PANEL
Anion gap: 8 (ref 5–15)
BUN: 8 mg/dL (ref 6–20)
CO2: 25 mmol/L (ref 22–32)
Calcium: 8.2 mg/dL — ABNORMAL LOW (ref 8.9–10.3)
Chloride: 104 mmol/L (ref 101–111)
Creatinine, Ser: 0.67 mg/dL (ref 0.44–1.00)
GFR calc Af Amer: >60 mL/min (ref >60)
GFR calc non Af Amer: >60 mL/min (ref >60)
Glucose, Bld: 245 mg/dL — ABNORMAL HIGH (ref 65–99)
POTASSIUM: 3.9 mmol/L (ref 3.5–5.1)
SODIUM: 137 mmol/L (ref 135–145)

## 2015-03-17 LAB — CBC
HCT: 33.9 % — ABNORMAL LOW (ref 35.0–47.0)
Hemoglobin: 11 g/dL — ABNORMAL LOW (ref 12.0–16.0)
MCH: 28.3 pg (ref 26.0–34.0)
MCHC: 32.5 g/dL (ref 32.0–36.0)
MCV: 87.2 fL (ref 80.0–100.0)
PLATELETS: 267 10*3/uL (ref 150–440)
RBC: 3.89 MIL/uL (ref 3.80–5.20)
RDW: 12.9 % (ref 11.5–14.5)
WBC: 11.1 10*3/uL — ABNORMAL HIGH (ref 3.6–11.0)

## 2015-03-17 LAB — GLUCOSE, CAPILLARY
GLUCOSE-CAPILLARY: 244 mg/dL — AB (ref 65–99)
GLUCOSE-CAPILLARY: 211 mg/dL — AB (ref 65–99)
GLUCOSE-CAPILLARY: 152 mg/dL — AB (ref 65–99)
Glucose-Capillary: 224 mg/dL — ABNORMAL HIGH (ref 65–99)
Glucose-Capillary: 157 mg/dL — ABNORMAL HIGH (ref 65–99)

## 2015-03-17 MED ORDER — INSULIN GLARGINE 100 UNIT/ML ~~LOC~~ SOLN
8.0000 [IU] | Freq: Once | SUBCUTANEOUS | Status: AC
  Administered 2015-03-17: 14:00:00 8 [IU] via SUBCUTANEOUS
  Filled 2015-03-17: qty 0.08

## 2015-03-17 MED ORDER — INSULIN GLARGINE 100 UNIT/ML ~~LOC~~ SOLN
60.0000 [IU] | Freq: Every day | SUBCUTANEOUS | Status: DC
  Administered 2015-03-18: 60 [IU] via SUBCUTANEOUS
  Filled 2015-03-17 (×2): qty 0.6

## 2015-03-17 NOTE — Progress Notes (Signed)
Report called to nurse on oncology. Prepare for transfer.

## 2015-03-17 NOTE — Progress Notes (Signed)
[facility] [place] [facility]   PATIENT NAME: Laura Escobar    MR#:  [id number]  DATE OF BIRTH:  [birth year]  SUBJECTIVE:  CHIEF COMPLAINT:   Chief Complaint  Patient presents with  . Abdominal Pain    Nausea/vomiting/cramps. Patient states she "started metformin on friday and it set off my gastroparesis"   Still have nausea and abdominal pain. Little better than yesterday.  REVIEW OF SYSTEMS:  CONSTITUTIONAL: No fever, fatigue or weakness.  EYES: No blurred or double vision.  EARS, NOSE, AND THROAT: No tinnitus or ear pain.  RESPIRATORY: No cough, shortness of breath, wheezing or hemoptysis.  CARDIOVASCULAR: No chest pain, orthopnea, edema.  GASTROINTESTINAL: positive for nausea, No vomiting, diarrhea , positive for abdominal pain.  GENITOURINARY: No dysuria, hematuria.  ENDOCRINE: No polyuria, nocturia,  HEMATOLOGY: No anemia, easy bruising or bleeding SKIN: No rash or lesion. MUSCULOSKELETAL: No joint pain or arthritis.   NEUROLOGIC: No tingling, numbness, weakness.  PSYCHIATRY: No anxiety or depression.   ROS  DRUG ALLERGIES:  No Known Allergies  VITALS:  Blood pressure 147/73, pulse 86, temperature 99.1 F (37.3 C), temperature source Oral, resp. rate 20, height  (1.702 m), weight 135.1 kg (297 lb 13.5 oz), last menstrual period 02/28/2015, SpO2 100 %.  PHYSICAL EXAMINATION:  GENERAL:  34 y.o.-year-old patient lying in the bed with no acute distress.  EYES: Pupils equal, round, reactive to light and accommodation. No scleral icterus. Extraocular muscles intact.  HEENT: Head atraumatic, normocephalic. Oropharynx and nasopharynx clear.  NECK:  Supple, no jugular venous distention. No thyroid enlargement, no tenderness.  LUNGS: Normal breath sounds bilaterally, no wheezing, rales,rhonchi or crepitation. No use of accessory muscles of respiration.  CARDIOVASCULAR: S1, S2 normal. No murmurs, rubs, or gallops.  ABDOMEN: Soft,  nontender, nondistended. Bowel sounds present. No organomegaly or mass.  EXTREMITIES: No pedal edema, cyanosis, or clubbing.  NEUROLOGIC: Cranial nerves II through XII are intact. Muscle strength 5/5 in all extremities. Sensation intact. Gait not checked.  PSYCHIATRIC: The patient is alert and oriented x 3.  SKIN: No obvious rash, lesion, or ulcer.   Physical Exam LABORATORY PANEL:   CBC  Recent Labs Lab 03/17/15 0406  WBC 11.1*  HGB 11.0*  HCT 33.9*  PLT 267   ------------------------------------------------------------------------------------------------------------------  Chemistries   Recent Labs Lab 03/15/15 1230  03/17/15 0406  NA 135  < > 137  K 4.1  < > 3.9  CL 98*  < > 104  CO2 20*  < > 25  GLUCOSE 377*  < > 245*  BUN 12  < > 8  CREATININE 0.88  < > 0.67  CALCIUM 9.0  < > 8.2*  AST 19  --   --   ALT 15  --   --   ALKPHOS 82  --   --   BILITOT 0.8  --   --   < > = values in this interval not displayed. ------------------------------------------------------------------------------------------------------------------  Cardiac Enzymes No results for input(s): TROPONINI in the last 168 hours. ------------------------------------------------------------------------------------------------------------------  RADIOLOGY:  No results found.  ASSESSMENT AND PLAN:   * DKA   Resolved now with Insulin drip, given [place] insulin.   Restarted on basal insulin.  * Diabetic Gastroperesis   IV reglan, Phenergan as needed.  Started on Erythromycin for it.   Checked Lipase level normal.  * UTI  rocephin IV.   All the records are reviewed and case discussed with Care Management/Social Workerr. Management plans discussed with the  patient, family and they are in agreement.  CODE STATUS: full  TOTAL TIME TAKING CARE OF THIS PATIENT: 35 minutes.    POSSIBLE D/C IN 1-2 DAYS, DEPENDING ON CLINICAL CONDITION.   Altamese Dilling M.D on 03/17/2015   Between 7am  to 6pm - Pager - [phone]  After 6pm go to [email or web address] - password [email or web address] [facility]  [facility]  Office  [phone]  CC: Primary care physician; Berneice Gandy, MD

## 2015-03-17 NOTE — Plan of Care (Signed)
Problem: Phase I Progression Outcomes Goal: CBGs steadily decreasing on IV insulin drip Outcome: Completed/Met Date Met:  03/17/15 Off insulin gtt yesterday Goal: Nausea/vomiting controlled with antiemetics Outcome: Progressing Nausea continues. zofran given. Abdominal pain "better today" Goal: Pain controlled with appropriate interventions Outcome: Progressing Abdominal pain improved.  Mid back ache this morning. Goal: OOB as tolerated unless otherwise ordered Outcome: Progressing Up in room. Tolerates well. Steady gait Goal: Initial discharge plan identified Outcome: Progressing To home with husband and son Goal: Voiding-avoid urinary catheter unless indicated Outcome: Completed/Met Date Met:  03/17/15 Voids without problem  Problem: Phase II Progression Outcomes Goal: Tolerating PO clear liquid diet Outcome: Not Progressing Still poor appetite with [place]/o nausea. Abdominal pain has improved

## 2015-03-17 NOTE — Progress Notes (Addendum)
Inpatient Diabetes Program Recommendations  AACE/ADA: New Consensus Statement on Inpatient Glycemic Control (2013)  Target Ranges:  Prepandial:   less than 140 mg/dL      Peak postprandial:   less than 180 mg/dL (1-2 hours)      Critically ill patients:  140 - 180 mg/dL   Results for Laura Escobar, Laura Escobar (MRN [id number]) as of 03/17/2015 08:02  Ref. Range 03/16/2015 08:01 03/16/2015 09:11 03/16/2015 11:34 03/16/2015 17:04 03/16/2015 21:54  Glucose-Capillary Latest Ref Range: 65-99 mg/dL 409 (H) 811 (H) 914 (H) 229 (H) 224 (H)   Current orders for Inpatient glycemic control: Lantus 52 units daily, Novolog 0-15 units TID with meals, Novolog 0-5 units HS  Inpatient Diabetes Program Recommendations Insulin - Basal: Please consider increasing Lantus to 60 units daily (based on 135 kg x 0.45 units). Insulin - Meal Coverage: Once patient begins eating she will likely need Novolog meal coverage (in addition to Novolog correction). Insulin-Correction: If patient continues not to eat, please change CBGs and Novolog correction to Q4H.  03/17/15 @ 13:08: Spoke with patient and her mother about diabetes and home regimen for diabetes control. Patient is much more alert and engaged in conversation today. However she states that she still feels nauseated. Patient states that she took Metformin Thursday night (July 21) and Friday morning (July 22) and the Invokana Friday morning (July 22) as instructed by Dr. Renae Fickle at [facility] but none since Friday (July 22). Patient reports that she began getting sick on Friday and has continued to have nausea since then. Patient states that she did not take any insulin since Thursday. She reports that she "did not feel good and she did not even stick myself to check my sugar." Discussed need for continuing insulin (especially basal insulin) even during sickness and inability to eat. Explained impact of sickness on glycemic control and encouraged patient to check her glucose more often (about  every 4 hours) when she is sick. Encouraged patient to talk with her Endocrinologist in more detail about sick day rules. Patient reports that she took Metformin when she was younger but had to stop because it kept her feeling nauseated. Discussed Lantus and NPH insulin in more detail. Patient is agreeable to being discharged on Lantus if her glucose can be maintained on it as an inpatient. Informed patient that the discharging doctor will instruct her on which medication to stop and/or start at time of discharge. Patient states that she plans to still start on the V-Go insulin pump in the near future. Patient verbalized understanding of information discussed and she states that she has no further questions at this time related to diabetes.   MD- If patient's glucose is controlled with Lantus and Novolog as an inpatient, would recommend discharging patient on Lantus and Regular (patient has Regular insulin at home) instead of restarting NPH at time of discharge. Would also recommend discontinuing all oral DM medications as an outpatient and just have her use insulin for glycemic control until she can follow up with Dr. Renae Fickle at [facility]. Her next appointment with Dr. Renae Fickle is scheduled for August 4th.    Thanks, Orlando Penner, RN, MSN, CCRN, CDE Diabetes Coordinator Inpatient Diabetes Program [phone] (Team Pager from 8am to 5pm) [phone] ([place] office) [phone] [facility] office) [phone] [facility] office)

## 2015-03-17 NOTE — Plan of Care (Signed)
Problem: Discharge Progression Outcomes Goal: Other Discharge Outcomes/Goals Outcome: Progressing Plan of care progress: -tx from [facility] today -continues fingersticks and SSI coverage -PRN pain meds for abdominal and back pain with improvement -PRN nausea med with relief

## 2015-03-17 NOTE — Care Management Important Message (Signed)
Important Message  Patient Details  Name: LAFREDA CASEBEER MRN: [id number] Date of Birth: [birth year]   Medicare Important Message Given:  Yes-second notification given    Collie Siad, RN 03/17/2015, 10:27 AM

## 2015-03-18 LAB — GLUCOSE, CAPILLARY: Glucose-Capillary: 155 mg/dL — ABNORMAL HIGH (ref 65–99)

## 2015-03-18 MED ORDER — CIPROFLOXACIN HCL 500 MG PO TABS
500.0000 mg | ORAL_TABLET | Freq: Two times a day (BID) | ORAL | Status: DC
Start: 2015-03-18 — End: 2015-04-15

## 2015-03-18 MED ORDER — ERYTHROMYCIN BASE 250 MG PO TABS: 250.0000 mg | ORAL_TABLET | Freq: Two times a day (BID) | ORAL | Status: DC

## 2015-03-18 MED ORDER — OMEPRAZOLE 20 MG PO CPDR: 20.0000 mg | DELAYED_RELEASE_CAPSULE | Freq: Every day | ORAL | Status: DC

## 2015-03-18 MED ORDER — LISINOPRIL 20 MG PO TABS: 20.0000 mg | ORAL_TABLET | Freq: Every day | ORAL | Status: DC

## 2015-03-18 MED ORDER — LISINOPRIL 20 MG PO TABS
20.0000 mg | ORAL_TABLET | Freq: Every day | ORAL | Status: DC
  Administered 2015-03-18: 20 mg via ORAL
  Filled 2015-03-18: qty 1

## 2015-03-18 NOTE — Progress Notes (Signed)
Discharge instructions given and went over with patient at bedside. 3 prescriptions given and in hand. All questions answered. Awaiting family member to transport patient home. Bo Mcclintock, RN

## 2015-03-18 NOTE — Discharge Summary (Signed)
[facility] - [place] at [facility]   PATIENT NAME: Laura Escobar    MR#:  [id number]  DATE OF BIRTH:  1981-06-21  DATE OF ADMISSION:  03/15/2015 ADMITTING PHYSICIAN: Milagros Loll, MD  DATE OF DISCHARGE: 03/18/2015  PRIMARY CARE PHYSICIAN: Berneice Gandy, MD    ADMISSION DIAGNOSIS:  UTI (lower urinary tract infection) [N39.0] Diabetic ketoacidosis without coma associated with type 2 diabetes mellitus [E13.10]  DISCHARGE DIAGNOSIS:  Active Problems:   DKA (diabetic ketoacidoses)   UTI (lower urinary tract infection)   Gastroparesis   SECONDARY DIAGNOSIS:   Past Medical History  Diagnosis Date  . Gastroparesis   . Diabetes mellitus without complication   . Bipolar 1 disorder   . Marijuana abuse     HOSPITAL COURSE:   * DKA  Resolved now with Insulin drip, given [place] insulin.  Restarted on basal insulin.  * Diabetic Gastroperesis  IV reglan, Phenergan as needed. Started on Erythromycin for it.  Checked Lipase level normal.  * UTI rocephin IV.  give cipro on d/c. DISCHARGE CONDITIONS:   Stable.  CONSULTS OBTAINED:     DRUG ALLERGIES:  No Known Allergies  DISCHARGE MEDICATIONS:   Current Discharge Medication List    START taking these medications   Details  ciprofloxacin (CIPRO) 500 MG tablet Take 1 tablet (500 mg total) by mouth 2 (two) times daily. Qty: 14 tablet, Refills: 0    erythromycin (E-MYCIN) 250 MG tablet Take 1 tablet (250 mg total) by mouth 2 (two) times daily with a meal. Qty: 10 tablet, Refills: 0    omeprazole (PRILOSEC) 20 MG capsule Take 1 capsule (20 mg total) by mouth daily. Qty: 30 capsule, Refills: 0    ondansetron (ZOFRAN ODT) 4 MG disintegrating tablet Take 1 tablet (4 mg total) by mouth every 8 (eight) hours as needed for nausea or vomiting. Qty: 20 tablet, Refills: 0      CONTINUE these medications which have CHANGED   Details  lisinopril (PRINIVIL,ZESTRIL) 20 MG tablet Take 1 tablet  (20 mg total) by mouth daily. Qty: 30 tablet, Refills: 0      CONTINUE these medications which have NOT CHANGED   Details  canagliflozin (INVOKANA) 100 MG TABS tablet Take 100 mg by mouth daily.    furosemide (LASIX) 40 MG tablet Take 60 mg by mouth daily.    glimepiride (AMARYL) 1 MG tablet Take 1 mg by mouth daily with breakfast.    !! insulin NPH Human (HUMULIN N,NOVOLIN N) 100 UNIT/ML injection Inject 27 Units into the skin daily before breakfast.    !! insulin NPH Human (HUMULIN N,NOVOLIN N) 100 UNIT/ML injection Inject 25 Units into the skin every evening.    insulin regular (NOVOLIN R,HUMULIN R) 100 units/mL injection Inject 6 Units into the skin 2 (two) times daily before a meal. Inject 6 units 30 minutes before lunch and dinner. May adjust as needed depending on blood sugar.    norelgestromin-ethinyl estradiol (ORTHO EVRA) 150-35 MCG/24HR transdermal patch Place 1 patch onto the skin once a week.     !! - Potential duplicate medications found. Please discuss with provider.       DISCHARGE INSTRUCTIONS:    Follow with PMD in 1 week.  If you experience worsening of your admission symptoms, develop shortness of breath, life threatening emergency, suicidal or homicidal thoughts you must seek medical attention immediately by calling 911 or calling your MD immediately  if symptoms less severe.  You Must read complete instructions/literature along with all  the possible adverse reactions/side effects for all the Medicines you take and that have been prescribed to you. Take any new Medicines after you have completely understood and accept all the possible adverse reactions/side effects.   Please note  You were cared for by a hospitalist during your hospital stay. If you have any questions about your discharge medications or the care you received while you were in the hospital after you are discharged, you can call the unit and asked to speak with the hospitalist on call if the  hospitalist that took care of you is not available. Once you are discharged, your primary care physician will handle any further medical issues. Please note that NO REFILLS for any discharge medications will be authorized once you are discharged, as it is imperative that you return to your primary care physician (or establish a relationship with a primary care physician if you do not have one) for your aftercare needs so that they can reassess your need for medications and monitor your lab values.    Today   CHIEF COMPLAINT:   Chief Complaint  Patient presents with  . Abdominal Pain    Nausea/vomiting/cramps. Patient states she "started metformin on friday and it set off my gastroparesis"    HISTORY OF PRESENT ILLNESS:  Laura Escobar  is a 34 y.o. female with a known history of diabetes mellitus type 2 requiring insulin, gastroparesis, bipolar disorder presents to the emergency room with complaints of nausea, vomiting, abdominal pain since Thursday. Patient mentions that this feels very similar to an year prior when she had gastroparesis attack and was admitted to [facility]. She has done well since then. She stopped taking her insulin since Thursday as she has been unable to eat. Blood sugars have been running high. In the emergency room she has been found to have a UTI, DKA and is being admitted to the hospital. She normally takes 25 units of NPH 2 times a day. And 6 units of regular insulin with meals   VITAL SIGNS:  Blood pressure 166/88, pulse 86, temperature 98.7 F (37.1 C), temperature source Oral, resp. rate 20, height 5\' 7"  (1.702 m), weight 138.574 kg (305 lb 8 oz), last menstrual period 02/28/2015, SpO2 98 %.  I/O:   Intake/Output Summary (Last 24 hours) at 03/18/15 0943 Last data filed at 03/17/15 1000  Gross per 24 hour  Intake    120 ml  Output    300 ml  Net   -180 ml    PHYSICAL EXAMINATION:   GENERAL: 34 y.o.-year-old patient lying in the bed with no  acute distress.  EYES: Pupils equal, round, reactive to light and accommodation. No scleral icterus. Extraocular muscles intact.  HEENT: Head atraumatic, normocephalic. Oropharynx and nasopharynx clear.  NECK: Supple, no jugular venous distention. No thyroid enlargement, no tenderness.  LUNGS: Normal breath sounds bilaterally, no wheezing, rales,rhonchi or crepitation. No use of accessory muscles of respiration.  CARDIOVASCULAR: S1, S2 normal. No murmurs, rubs, or gallops.  ABDOMEN: Soft, nontender, nondistended. Bowel sounds present. No organomegaly or mass.  EXTREMITIES: No pedal edema, cyanosis, or clubbing.  NEUROLOGIC: Cranial nerves II through XII are intact. Muscle strength 5/5 in all extremities. Sensation intact. Gait not checked.  PSYCHIATRIC: The patient is alert and oriented x 3.  SKIN: No obvious rash, lesion, or ulcer.   DATA REVIEW:   CBC  Recent Labs Lab 03/17/15 0406  WBC 11.1*  HGB 11.0*  HCT 33.9*  PLT 267    Chemistries  Recent Labs Lab 03/15/15 1230  03/17/15 0406  NA 135  < > 137  K 4.1  < > 3.9  CL 98*  < > 104  CO2 20*  < > 25  GLUCOSE 377*  < > 245*  BUN 12  < > 8  CREATININE 0.88  < > 0.67  CALCIUM 9.0  < > 8.2*  AST 19  --   --   ALT 15  --   --   ALKPHOS 82  --   --   BILITOT 0.8  --   --   < > = values in this interval not displayed.  Cardiac Enzymes No results for input(s): TROPONINI in the last 168 hours.  Microbiology Results  Results for orders placed or performed during the hospital encounter of 03/15/15  Urine culture     Status: None (Preliminary result)   Collection Time: 03/15/15 12:33 PM  Result Value Ref Range Status   Specimen Description URINE, CLEAN CATCH  Final   Special Requests NONE  Final   Culture >=100,000 COLONIES/mL ESCHERICHIA COLI  Final   Report Status PENDING  Incomplete   Organism ID, Bacteria ESCHERICHIA COLI  Final      Susceptibility   Escherichia coli - MIC*    AMPICILLIN >=32 RESISTANT  Resistant     CEFAZOLIN <=4 SENSITIVE Sensitive     CEFTRIAXONE <=1 SENSITIVE Sensitive     CIPROFLOXACIN <=0.25 SENSITIVE Sensitive     GENTAMICIN <=1 SENSITIVE Sensitive     IMIPENEM <=0.25 SENSITIVE Sensitive     NITROFURANTOIN <=16 SENSITIVE Sensitive     TRIMETH/SULFA <=20 SENSITIVE Sensitive     PIP/TAZO Value in next row Sensitive      SENSITIVE<=4    AMPICILLIN/SULBACTAM Value in next row Resistant      RESISTANT>=32    * >=100,000 COLONIES/mL ESCHERICHIA COLI  MRSA PCR Screening     Status: Abnormal   Collection Time: 03/15/15  4:44 PM  Result Value Ref Range Status   MRSA by PCR POSITIVE (A) NEGATIVE Final    Comment:        The GeneXpert MRSA Assay (FDA approved for NASAL specimens only), is one component of a comprehensive MRSA colonization surveillance program. It is not intended to diagnose MRSA infection nor to guide or monitor treatment for MRSA infections. CRITICAL RESULT CALLED TO, READ BACK BY AND VERIFIED WITH: DALE HOPKINS @ 0506 7.27.16 MPG     RADIOLOGY:  No results found.  EKG:  No orders found for this or any previous visit.    Management plans discussed with the patient, family and they are in agreement.  CODE STATUS:     Code Status Orders        Start     Ordered   03/15/15 1506  Full code   Continuous     03/15/15 1507      TOTAL TIME TAKING CARE OF THIS PATIENT: 35 minutes.    Altamese Dilling M.D on 03/18/2015 at 9:43 AM  Between 7am to 6pm - Pager - [phone]  After 6pm go to [email or web address] - password [email or web address] [facility]  [facility]  Office  [phone]  CC: Primary care physician; Berneice Gandy, MD

## 2015-03-18 NOTE — Plan of Care (Signed)
Problem: Discharge Progression Outcomes Goal: Discharge plan in place and appropriate Individualism Outcome: Progressing Pt likes to be called Laura Escobar Came from CCU Goal: Other Discharge Outcomes/Goals Outcome: Progressing Plan of care progress to goal: Pain - abdominal pain relieved with morphine as needed Hemodynamically stable - VSS Complications - nausea.Marland KitchenMarland Kitchenphenergan requested Diet - fair Activity - up ad lib

## 2015-03-18 NOTE — Progress Notes (Signed)
Patient discharged home with mother via wheelchair by nursing staff. Bo Mcclintock, RN

## 2015-03-19 LAB — URINE CULTURE

## 2015-04-15 ENCOUNTER — Inpatient Hospital Stay
Admission: EM | Admit: 2015-04-15 | Discharge: 2015-04-18 | DRG: 623 | Disposition: A | Discharge status: Home Health | Payer: Medicare Other | Attending: Internal Medicine | Admitting: Internal Medicine

## 2015-04-15 ENCOUNTER — Emergency Department: Payer: Medicare Other

## 2015-04-15 ENCOUNTER — Encounter: Payer: Self-pay | Admitting: Emergency Medicine

## 2015-04-15 DIAGNOSIS — E1165 Type 2 diabetes mellitus with hyperglycemia: Secondary | ICD-10-CM | POA: Diagnosis present

## 2015-04-15 DIAGNOSIS — L8990 Pressure ulcer of unspecified site, unspecified stage: Secondary | ICD-10-CM | POA: Insufficient documentation

## 2015-04-15 DIAGNOSIS — Z6841 Body Mass Index (BMI) 40.0 and over, adult: Secondary | ICD-10-CM | POA: Diagnosis not present

## 2015-04-15 DIAGNOSIS — B9561 Methicillin susceptible Staphylococcus aureus infection as the cause of diseases classified elsewhere: Secondary | ICD-10-CM | POA: Diagnosis present

## 2015-04-15 DIAGNOSIS — F17210 Nicotine dependence, cigarettes, uncomplicated: Secondary | ICD-10-CM | POA: Diagnosis present

## 2015-04-15 DIAGNOSIS — E1140 Type 2 diabetes mellitus with diabetic neuropathy, unspecified: Secondary | ICD-10-CM | POA: Diagnosis present

## 2015-04-15 DIAGNOSIS — I10 Essential (primary) hypertension: Secondary | ICD-10-CM | POA: Diagnosis present

## 2015-04-15 DIAGNOSIS — Z794 Long term (current) use of insulin: Secondary | ICD-10-CM

## 2015-04-15 DIAGNOSIS — L0390 Cellulitis, unspecified: Secondary | ICD-10-CM | POA: Diagnosis present

## 2015-04-15 DIAGNOSIS — L97509 Non-pressure chronic ulcer of other part of unspecified foot with unspecified severity: Secondary | ICD-10-CM

## 2015-04-15 DIAGNOSIS — L03115 Cellulitis of right lower limb: Secondary | ICD-10-CM | POA: Diagnosis present

## 2015-04-15 DIAGNOSIS — E10621 Type 1 diabetes mellitus with foot ulcer: Secondary | ICD-10-CM | POA: Diagnosis present

## 2015-04-15 DIAGNOSIS — M869 Osteomyelitis, unspecified: Secondary | ICD-10-CM | POA: Diagnosis present

## 2015-04-15 DIAGNOSIS — Z9641 Presence of insulin pump (external) (internal): Secondary | ICD-10-CM | POA: Diagnosis present

## 2015-04-15 DIAGNOSIS — L97519 Non-pressure chronic ulcer of other part of right foot with unspecified severity: Secondary | ICD-10-CM | POA: Diagnosis present

## 2015-04-15 DIAGNOSIS — E11621 Type 2 diabetes mellitus with foot ulcer: Secondary | ICD-10-CM | POA: Diagnosis present

## 2015-04-15 DIAGNOSIS — E669 Obesity, unspecified: Secondary | ICD-10-CM | POA: Diagnosis present

## 2015-04-15 DIAGNOSIS — K219 Gastro-esophageal reflux disease without esophagitis: Secondary | ICD-10-CM | POA: Diagnosis present

## 2015-04-15 DIAGNOSIS — M25474 Effusion, right foot: Secondary | ICD-10-CM

## 2015-04-15 DIAGNOSIS — Z833 Family history of diabetes mellitus: Secondary | ICD-10-CM

## 2015-04-15 DIAGNOSIS — I96 Gangrene, not elsewhere classified: Secondary | ICD-10-CM

## 2015-04-15 LAB — BASIC METABOLIC PANEL
ANION GAP: 9 (ref 5–15)
BUN: 9 mg/dL (ref 6–20)
CO2: 25 mmol/L (ref 22–32)
Calcium: 9.1 mg/dL (ref 8.9–10.3)
Chloride: 101 mmol/L (ref 101–111)
Creatinine, Ser: 0.84 mg/dL (ref 0.44–1.00)
GFR calc non Af Amer: >60 mL/min (ref >60)
Glucose, Bld: 365 mg/dL — ABNORMAL HIGH (ref 65–99)
POTASSIUM: 4.1 mmol/L (ref 3.5–5.1)
Sodium: 135 mmol/L (ref 135–145)

## 2015-04-15 LAB — CBC WITH DIFFERENTIAL/PLATELET
BASOS ABS: 0.2 10*3/uL — AB (ref 0–0.1)
BASOS PCT: 1 %
EOS ABS: 0.1 10*3/uL (ref 0–0.7)
EOS PCT: 1 %
HCT: 35.7 % (ref 35.0–47.0)
Hemoglobin: 11.6 g/dL — ABNORMAL LOW (ref 12.0–16.0)
Lymphocytes Relative: 23 %
Lymphs Abs: 3.2 10*3/uL (ref 1.0–3.6)
MCH: 28.5 pg (ref 26.0–34.0)
MCHC: 32.5 g/dL (ref 32.0–36.0)
MCV: 87.4 fL (ref 80.0–100.0)
Monocytes Absolute: 0.7 10*3/uL (ref 0.2–0.9)
Monocytes Relative: 5 %
Neutro Abs: 9.6 10*3/uL — ABNORMAL HIGH (ref 1.4–6.5)
Neutrophils Relative %: 70 %
PLATELETS: 341 10*3/uL (ref 150–440)
RBC: 4.08 MIL/uL (ref 3.80–5.20)
RDW: 13.1 % (ref 11.5–14.5)
WBC: 13.8 10*3/uL — AB (ref 3.6–11.0)

## 2015-04-15 LAB — HCG, QUANTITATIVE, PREGNANCY: HCG, BETA CHAIN, QUANT, S: 1 m[IU]/mL (ref <5)

## 2015-04-15 MED ORDER — INSULIN NPH (HUMAN) (ISOPHANE) 100 UNIT/ML ~~LOC~~ SUSP
25.0000 [IU] | Freq: Two times a day (BID) | SUBCUTANEOUS | Status: DC
Start: 2015-04-15 — End: 2015-04-15

## 2015-04-15 MED ORDER — ACETAMINOPHEN 325 MG PO TABS
650.0000 mg | ORAL_TABLET | Freq: Four times a day (QID) | ORAL | Status: DC | PRN
  Administered 2015-04-16 – 2015-04-17 (×2): 650 mg via ORAL
  Filled 2015-04-15 (×2): qty 2

## 2015-04-15 MED ORDER — ONDANSETRON HCL 4 MG/2ML IJ SOLN: 4.0000 mg | Freq: Four times a day (QID) | INTRAMUSCULAR | Status: DC | PRN

## 2015-04-15 MED ORDER — INSULIN REGULAR HUMAN 100 UNIT/ML IJ SOLN: 6.0000 [IU] | Freq: Two times a day (BID) | INTRAMUSCULAR | Status: DC

## 2015-04-15 MED ORDER — LISINOPRIL 20 MG PO TABS
20.0000 mg | ORAL_TABLET | Freq: Every day | ORAL | Status: DC
  Administered 2015-04-15 – 2015-04-18 (×4): 20 mg via ORAL
  Filled 2015-04-15 (×4): qty 1

## 2015-04-15 MED ORDER — VANCOMYCIN HCL IN DEXTROSE 1-5 GM/200ML-% IV SOLN
1000.0000 mg | Freq: Once | INTRAVENOUS | Status: AC
Start: 2015-04-15 — End: 2015-04-16
  Administered 2015-04-15: 1000 mg via INTRAVENOUS
  Filled 2015-04-15: qty 200

## 2015-04-15 MED ORDER — ENOXAPARIN SODIUM 40 MG/0.4ML ~~LOC~~ SOLN
40.0000 mg | SUBCUTANEOUS | Status: DC
  Administered 2015-04-15 – 2015-04-17 (×3): 40 mg via SUBCUTANEOUS
  Filled 2015-04-15 (×3): qty 0.4

## 2015-04-15 MED ORDER — FUROSEMIDE 20 MG PO TABS
60.0000 mg | ORAL_TABLET | Freq: Every day | ORAL | Status: DC
  Administered 2015-04-16 – 2015-04-18 (×3): 60 mg via ORAL
  Filled 2015-04-15 (×3): qty 1

## 2015-04-15 MED ORDER — ONDANSETRON 4 MG PO TBDP
4.0000 mg | ORAL_TABLET | Freq: Three times a day (TID) | ORAL | Status: DC | PRN
  Filled 2015-04-15: qty 1

## 2015-04-15 MED ORDER — INSULIN DETEMIR 100 UNIT/ML ~~LOC~~ SOLN
27.0000 [IU] | Freq: Every day | SUBCUTANEOUS | Status: DC
  Administered 2015-04-16 – 2015-04-18 (×3): 27 [IU] via SUBCUTANEOUS
  Filled 2015-04-15 (×4): qty 0.27

## 2015-04-15 MED ORDER — INSULIN DETEMIR 100 UNIT/ML ~~LOC~~ SOLN
25.0000 [IU] | Freq: Every day | SUBCUTANEOUS | Status: DC
  Administered 2015-04-16 – 2015-04-17 (×2): 25 [IU] via SUBCUTANEOUS
  Filled 2015-04-15 (×3): qty 0.25

## 2015-04-15 MED ORDER — ACETAMINOPHEN 650 MG RE SUPP: 650.0000 mg | Freq: Four times a day (QID) | RECTAL | Status: DC | PRN

## 2015-04-15 MED ORDER — HYDROCODONE-ACETAMINOPHEN 5-325 MG PO TABS: 1.0000 | ORAL_TABLET | ORAL | Status: DC | PRN

## 2015-04-15 MED ORDER — ONDANSETRON HCL 4 MG PO TABS: 4.0000 mg | ORAL_TABLET | Freq: Four times a day (QID) | ORAL | Status: DC | PRN

## 2015-04-15 MED ORDER — INSULIN ASPART 100 UNIT/ML ~~LOC~~ SOLN
6.0000 [IU] | Freq: Two times a day (BID) | SUBCUTANEOUS | Status: DC
  Administered 2015-04-16 – 2015-04-18 (×5): 6 [IU] via SUBCUTANEOUS
  Filled 2015-04-15 (×5): qty 6

## 2015-04-15 MED ORDER — PANTOPRAZOLE SODIUM 40 MG PO TBEC
40.0000 mg | DELAYED_RELEASE_TABLET | Freq: Every day | ORAL | Status: DC
  Administered 2015-04-16 – 2015-04-18 (×3): 40 mg via ORAL
  Filled 2015-04-15 (×3): qty 1

## 2015-04-15 MED ORDER — VANCOMYCIN HCL IN DEXTROSE 1-5 GM/200ML-% IV SOLN
1000.0000 mg | Freq: Three times a day (TID) | INTRAVENOUS | Status: DC
  Administered 2015-04-16 – 2015-04-18 (×8): 1000 mg via INTRAVENOUS
  Filled 2015-04-15 (×11): qty 200

## 2015-04-15 NOTE — ED Notes (Signed)
Patient to ED with c/o infection to right great toe, has been there about 2 weeks, sent here by Dr. Ether Griffins due to probable need for IVAB.

## 2015-04-15 NOTE — ED Provider Notes (Signed)
[facility] Emergency Department Provider Note  ____________________________________________  Time seen: Approximately 6:01 PM  I have reviewed the triage vital signs and the nursing notes.   HISTORY  Chief Complaint Toe Pain and Wound Infection    HPI Laura Escobar is a 34 y.o. female with history of diabetes, uncontrolled followed by Dr. Renae Fickle, and bipolar disorder who presents with worsening redness, and swelling to the right foot, including a ulcer to the right great toe. She was hospitalized last month for a UTI, DKA and a staph infection to the left leg. She reports complete resolution of her symptoms and went to the [facility] for [place]. She did walk barefoot frequently at the beach. Her right great toenail came off and she continued to monitor her toe. On return from the beach, 2 weeks ago she noted an ulcer on the bottom of her right great toe. She has had very little pain. She also has noticed a blue discoloration to the mid foot, plantar surface. She has had occasional chills but no fever. She denies headaches or nausea and vomiting. He denies shortness of breath, chest pain   Past Medical History  Diagnosis Date  . Gastroparesis   . Diabetes mellitus without complication   . Bipolar 1 disorder   . Marijuana abuse     Patient Active Problem List   Diagnosis Date Noted  . DKA (diabetic ketoacidoses) 03/15/2015  . UTI (lower urinary tract infection) 03/15/2015  . Gastroparesis 03/15/2015    History reviewed. No pertinent past surgical history.  Current Outpatient Rx  Name  Route  Sig  Dispense  Refill  . furosemide (LASIX) 40 MG tablet   Oral   Take 60 mg by mouth daily.         . insulin NPH Human (HUMULIN N,NOVOLIN N) 100 UNIT/ML injection   Subcutaneous   Inject 25-27 Units into the skin 2 (two) times daily. Pt uses 27 units in the morning and 25 units in the evening.         . insulin regular (NOVOLIN R,HUMULIN R) 100 units/mL  injection   Subcutaneous   Inject 6 Units into the skin 2 (two) times daily before a meal.          . lisinopril (PRINIVIL,ZESTRIL) 20 MG tablet   Oral   Take 1 tablet (20 mg total) by mouth daily.   30 tablet   0   . omeprazole (PRILOSEC) 20 MG capsule   Oral   Take 1 capsule (20 mg total) by mouth daily.   30 capsule   0   . ondansetron (ZOFRAN ODT) 4 MG disintegrating tablet   Oral   Take 1 tablet (4 mg total) by mouth every 8 (eight) hours as needed for nausea or vomiting.   20 tablet   0   . ciprofloxacin (CIPRO) 500 MG tablet   Oral   Take 1 tablet (500 mg total) by mouth 2 (two) times daily. Patient not taking: Reported on 04/15/2015   14 tablet   0   . erythromycin (E-MYCIN) 250 MG tablet   Oral   Take 1 tablet (250 mg total) by mouth 2 (two) times daily with a meal. Patient not taking: Reported on 04/15/2015   10 tablet   0     Allergies Review of patient's allergies indicates no known allergies.  Family History  Problem Relation Age of Onset  . Diabetes Mother     Social History Social History  Substance Use  Topics  . Smoking status: Current Every Day Smoker  . Smokeless tobacco: None  . Alcohol Use: No    Review of Systems Constitutional: chills Cardiovascular: Denies chest pain. Respiratory: Denies shortness of breath. Gastrointestinal: No abdominal pain.  No nausea, no vomiting.  No diarrhea. Hx of gastroparesis Genitourinary: Negative for dysuria. Musculoskeletal: Negative for back pain. Skin: Negative for rash. Toe ulcer as above Neurological: Negative for headaches, focal weakness or numbness.  10-point ROS otherwise negative.  ____________________________________________   PHYSICAL EXAM:  VITAL SIGNS: ED Triage Vitals  Enc Vitals Group     BP 04/15/15 1609 140/80 mmHg     Pulse Rate 04/15/15 1609 100     Resp 04/15/15 1609 20     Temp 04/15/15 1609 97.6 F (36.4 C)     Temp Source 04/15/15 1609 Oral     SpO2 04/15/15  1609 96 %     Weight 04/15/15 1609 295 lb (133.811 kg)     Height 04/15/15 1609  (1.702 m)     Head Cir --      Peak Flow --      Pain Score --      Pain Loc --      Pain Edu? --      Excl. in GC? --     Constitutional: Alert and oriented. Well appearing and in no acute distress. Eyes: Conjunctivae are normal. PERRL. EOMI. Head: Atraumatic. Nose: No congestion/rhinnorhea. Mouth/Throat: Mucous membranes are moist.  Oropharynx non-erythematous. Neck: No stridor.   Cardiovascular: Normal rate, regular rhythm. Grossly normal heart sounds.  Good peripheral circulation. Respiratory: Normal respiratory effort.  No retractions. Lungs CTAB. Gastrointestinal: Soft and nontender. No distention. No abdominal bruits. No CVA tenderness. Musculoskeletal: No lower extremity tenderness nor edema.  No joint effusions. Neurologic:  Normal speech and language. Sensation deficit to the right great toe, but also suspicious of deficits to the entire foot, as she isn't sensing pain as I would expect Skin:  Skin is warm, dry and intact. She has a circular ulcer to the right great toe, plantar surface with a white discoloration proximally, as well as swelling of the toe and right foot. Also erythema noted with some blanching. Very minimal tenderness. She has a focal area of swelling on the mid foot, plantar surface possible bulla. Psychiatric: Mood and affect are normal. Speech and behavior are normal.  ____________________________________________   LABS (all labs ordered are listed, but only abnormal results are displayed)  Labs Reviewed  CBC WITH DIFFERENTIAL/PLATELET - Abnormal; Notable for the following:    WBC 13.8 (*)    Hemoglobin 11.6 (*)    Neutro Abs 9.6 (*)    Basophils Absolute 0.2 (*)    All other components within normal limits  BASIC METABOLIC PANEL - Abnormal; Notable for the following:    Glucose, Bld 365 (*)    All other components within normal limits    ____________________________________________  EKG   ____________________________________________  RADIOLOGY  CLINICAL DATA: Right big toe ulcer and infection for 2 weeks.  EXAM: RIGHT GREAT TOE  COMPARISON: None.  FINDINGS: Great toe soft tissue swelling is noted. Gas in the distal soft tissues is present compatible with ulceration/infection.  No radiographic evidence of osteomyelitis is identified.  There is no evidence of fracture or subluxation.  IMPRESSION: Distal great toe soft tissue swelling with gas compatible with ulcerations/infection. No radiographic evidence of osteomyelitis.   Electronically Signed  By: Harmon Pier M.D. ____________________________________________   PROCEDURES  Procedure(s) performed:  None  Critical Care performed: No  ____________________________________________   INITIAL IMPRESSION / ASSESSMENT AND PLAN / ED COURSE  Pertinent labs & imaging results that were available during my care of the patient were reviewed by me and considered in my medical decision making (see chart for details).  34 year old female with diabetes who presents with worsening right great toe ulcer, plantar surface. She has blanching distally, with proximal redness and swelling.  Xray as above.  Also area of concern on the mid foot, plantar surface, for possible abscess. She has a recent hospitalization 03/15/15 for DKA, UTI and MRSA skin infection.  She will be admitted for further evaluation and treatment of her diabetic foot infection. ____________________________________________   FINAL CLINICAL IMPRESSION(S) / ED DIAGNOSES  Final diagnoses:  Gangrene of toe  Cellulitis of foot, right      Ignacia Bayley, PA-C 04/15/15 1854  Loleta Rose, MD 04/15/15 [phone]

## 2015-04-15 NOTE — ED Provider Notes (Signed)
Medical screening examination/treatment/procedure(s) were conducted as a shared visit with non-physician practitioner(s) and myself.  I personally evaluated the patient during the encounter including history and physical exam. I discussed the case with the nonphysician practitioner and reviewed their notes and agree with the following comments:  The patient has hypertension and diabetes, and complains of a worsening wound on the right great toe distal aspect over the past 2 weeks. She also notes that the distal area has become numb over the past week. She's been keeping it wrapped but notes that it just continues to get worse. She saw her primary care doctor who sent her to the ED for IV antibiotics.  Vital signs are stable, but the patient does have a leukocytosis. On exam she does have diffuse swelling of the great toe and foot consistent with a cellulitis of the toe and foot. Additionally she has a large ulceration of the right great toe 3-4 cm with gangrene at the Center. The distal great toe is blanched and insensate. She does have intact sensation around the base of the toe.  Evaluation is consistent with toe gangrene and cellulitis, requiring IV antibiotics and podiatry evaluation for debridement and possible toe amputation.  The patient also reports a concern that she could be pregnant. If her unable to find the results from her primary care visit today, we will recheck a pregnancy test answer that question for her.  Clinical impression is toe gangrene and foot cellulitis.    Sharman Cheek, MD 04/15/15 [phone]

## 2015-04-15 NOTE — H&P (Signed)
[facility] - [place] at [facility]   PATIENT NAME: Laura Escobar    MR#:  [id number]  DATE OF BIRTH:  12/10/1980  DATE OF ADMISSION:  04/15/2015  PRIMARY CARE PHYSICIAN: Berneice Gandy, MD   REQUESTING/REFERRING PHYSICIAN: Dr. Alfonse Flavors  CHIEF COMPLAINT:   Chief Complaint  Patient presents with  . Toe Pain  . Wound Infection    HISTORY OF PRESENT ILLNESS:  Laura Escobar  is a 34 y.o. female with a known history of diabetes, hypertension, gastroparesis, history of bipolar disorder, who presents to the hospital due to right great toe ulcer/cellulitis. Patient says she noticed that her right toe had an open sore after she recently came back from the beach couple days back. He didn't notice a foul smelling discharge from the ulcer and some swelling of her foot and went to see her primary care physician who then referred her to the ER for further evaluation. Patient was suspected to have a right foot/great toe cellulitis and hospitalist services were contacted further treatment and evaluation. Patient denies any fevers, chills, nausea, vomiting or any other associated symptoms presently.  PAST MEDICAL HISTORY:   Past Medical History  Diagnosis Date  . Gastroparesis   . Diabetes mellitus without complication   . Bipolar 1 disorder   . Marijuana abuse     PAST SURGICAL HISTORY:  History reviewed. No pertinent past surgical history.  SOCIAL HISTORY:   Social History  Substance Use Topics  . Smoking status: Current Every Day Smoker  . Smokeless tobacco: Not on file  . Alcohol Use: No    FAMILY HISTORY:   Family History  Problem Relation Age of Onset  . Diabetes Mother     DRUG ALLERGIES:  No Known Allergies  REVIEW OF SYSTEMS:   Review of Systems  Constitutional: Negative for fever and weight loss.  HENT: Negative for congestion, nosebleeds and tinnitus.   Eyes: Negative for blurred vision, double vision and redness.  Respiratory:  Negative for cough, hemoptysis and shortness of breath.   Cardiovascular: Negative for chest pain, orthopnea, leg swelling and PND.  Gastrointestinal: Negative for nausea, vomiting, abdominal pain, diarrhea and melena.  Genitourinary: Negative for dysuria, urgency and hematuria.  Musculoskeletal: Negative for joint pain and falls.  Neurological: Positive for headaches. Negative for dizziness, tingling, sensory change, focal weakness, seizures and weakness.  Endo/Heme/Allergies: Negative for polydipsia. Does not bruise/bleed easily.  Psychiatric/Behavioral: Negative for depression and memory loss. The patient is not nervous/anxious.     MEDICATIONS AT HOME:   Prior to Admission medications   Medication Sig Start Date End Date Taking? Authorizing Provider  furosemide (LASIX) 40 MG tablet Take 60 mg by mouth daily.   Yes Historical Provider, MD  insulin NPH Human (HUMULIN N,NOVOLIN N) 100 UNIT/ML injection Inject 25-27 Units into the skin 2 (two) times daily. Pt uses 27 units in the morning and 25 units in the evening.   Yes Historical Provider, MD  insulin regular (NOVOLIN R,HUMULIN R) 100 units/mL injection Inject 6 Units into the skin 2 (two) times daily before a meal.    Yes Historical Provider, MD  lisinopril (PRINIVIL,ZESTRIL) 20 MG tablet Take 1 tablet (20 mg total) by mouth daily. 03/18/15  Yes Altamese Dilling, MD  omeprazole (PRILOSEC) 20 MG capsule Take 1 capsule (20 mg total) by mouth daily. 03/18/15  Yes Altamese Dilling, MD  ondansetron (ZOFRAN ODT) 4 MG disintegrating tablet Take 1 tablet (4 mg total) by mouth every 8 (eight) hours as needed  for nausea or vomiting. 03/15/15  Yes Jene Every, MD  ciprofloxacin (CIPRO) 500 MG tablet Take 1 tablet (500 mg total) by mouth 2 (two) times daily. Patient not taking: Reported on 04/15/2015 03/15/15   Jene Every, MD  erythromycin (E-MYCIN) 250 MG tablet Take 1 tablet (250 mg total) by mouth 2 (two) times daily with a meal. Patient  not taking: Reported on 04/15/2015 03/18/15   Altamese Dilling, MD      VITAL SIGNS:  Blood pressure 158/97, pulse 93, temperature 98.3 F (36.8 C), temperature source Oral, resp. rate 20, height 5\' 7"  (1.702 m), weight 133.811 kg (295 lb), last menstrual period 03/21/2015, SpO2 100 %.  PHYSICAL EXAMINATION:  Physical Exam  GENERAL:  34 y.o.-year-old patient obese lying in the bed with no acute distress.  EYES: Pupils equal, round, reactive to light and accommodation. No scleral icterus. Extraocular muscles intact.  HEENT: Head atraumatic, normocephalic. Oropharynx and nasopharynx clear. No oropharyngeal erythema, moist oral mucosa  NECK:  Supple, no jugular venous distention. No thyroid enlargement, no tenderness.  LUNGS: Normal breath sounds bilaterally, no wheezing, rales, rhonchi. No use of accessory muscles of respiration.  CARDIOVASCULAR: S1, S2 RRR. No murmurs, rubs, gallops, clicks.  ABDOMEN: Soft, nontender, nondistended. Bowel sounds present. No organomegaly or mass.  EXTREMITIES: Right great toe ulcer with some foul-smelling drainage. Right foot swelling compared to the left and warm to touch. +2 pedal and radial pulses bilaterally. No cyanosis, clubbing. +1-2 pitting edema bilaterally.   NEUROLOGIC: Cranial nerves II through XII are intact. No focal Motor or sensory deficits appreciated b/l PSYCHIATRIC: The patient is alert and oriented x 3. Good affect.  SKIN: No obvious rash, lesion, right great toe ulcer about 3 x 3 cm with foul-smelling drainage.  LABORATORY PANEL:   CBC  Recent Labs Lab 04/15/15 1608  WBC 13.8*  HGB 11.6*  HCT 35.7  PLT 341   ------------------------------------------------------------------------------------------------------------------  Chemistries   Recent Labs Lab 04/15/15 1608  NA 135  K 4.1  CL 101  CO2 25  GLUCOSE 365*  BUN 9  CREATININE 0.84  CALCIUM 9.1    ------------------------------------------------------------------------------------------------------------------  Cardiac Enzymes No results for input(s): TROPONINI in the last 168 hours. ------------------------------------------------------------------------------------------------------------------  RADIOLOGY:  Dg Toe Great Right  04/15/2015   CLINICAL DATA:  Right big toe ulcer and infection for 2 weeks.  EXAM: RIGHT GREAT TOE  COMPARISON:  None.  FINDINGS: Great toe soft tissue swelling is noted. Gas in the distal soft tissues is present compatible with ulceration/infection.  No radiographic evidence of osteomyelitis is identified.  There is no evidence of fracture or subluxation.  IMPRESSION: Distal great toe soft tissue swelling with gas compatible with ulcerations/infection. No radiographic evidence of osteomyelitis.   Electronically Signed   By: Harmon Pier M.D.   On: 04/15/2015 16:51     IMPRESSION AND PLAN:   34 year old female with past medical history of diabetes, hypertension, diabetic neuropathy, GERD who presents to the hospital due to a right great toe ulcer and foul-smelling discharge and noted to have cellulitis.  #1 right foot/great toe cellulitis-x-ray shows no evidence of osteomyelitis. -We'll start patient on IV antibiotics with vancomycin, Zosyn. -Get a podiatry consult. Patient likely will need debridement/incision and drainage, But will defer to podiatry. Clinically patient is afebrile and hemodynamically stable.  #2 diabetes type 2 without complication-continue NPH, regular insulin.  #3 GERD-continue Protonix.  #4 hypertension-continue lisinopril.   All the records are reviewed and case discussed with ED provider. Management plans discussed with  the patient, family and they are in agreement.  CODE STATUS: Full  TOTAL TIME TAKING CARE OF THIS PATIENT: 45 minutes.    Houston Siren M.D on 04/15/2015 at 8:10 PM  Between 7am to 6pm - Pager -  [phone]  After 6pm go to [email or web address] - password [email or web address] [facility]  [place]  Office  [phone]  CC: Primary care physician; Berneice Gandy, MD

## 2015-04-15 NOTE — Progress Notes (Signed)
ANTIBIOTIC CONSULT NOTE - INITIAL  Pharmacy Consult for vancomycin dosing Indication: cellulitis  No Known Allergies  Patient Measurements: Height:  (170.2 cm) Weight: 299 lb 1.6 oz (135.671 kg) IBW/kg (Calculated) : 61.6 Adjusted Body Weight: 91 kg  Vital Signs: Temp: 97.3 F (36.3 C) (08/26 2100) Temp Source: Oral (08/26 2100) BP: 130/80 mmHg (08/26 2100) Pulse Rate: 102 (08/26 2100) Intake/Output from previous day:   Intake/Output from this shift:    Labs:  Recent Labs  04/15/15 1608  WBC 13.8*  HGB 11.6*  PLT 341  CREATININE 0.84   Estimated Creatinine Clearance: 134.6 mL/min (by C-G formula based on Cr of 0.84). No results for input(Escobar): VANCOTROUGH, VANCOPEAK, VANCORANDOM, GENTTROUGH, GENTPEAK, GENTRANDOM, TOBRATROUGH, TOBRAPEAK, TOBRARND, AMIKACINPEAK, AMIKACINTROU, AMIKACIN in the last 72 hours.   Microbiology: No results found for this or any previous visit (from the past 720 hour(Escobar)).  Medical History: Past Medical History  Diagnosis Date  . Gastroparesis   . Diabetes mellitus without complication   . Bipolar 1 disorder   . Marijuana abuse     Medications:   Assessment: No osteomyelitis on inaging  Goal of Therapy:  Vancomycin trough level 10-15 mcg/ml  Plan:  TBW 135.7kg  IBW 61.6kg  DW 91kg Vd 64L kei 0.96 hr-1  t1/2 7 hours 1 gram q 8 hours ordered with stacked dosing. Level before 5th dose.  Laura Escobar 04/15/2015,10:43 PM

## 2015-04-16 ENCOUNTER — Inpatient Hospital Stay: Payer: Medicare Other

## 2015-04-16 DIAGNOSIS — L8990 Pressure ulcer of unspecified site, unspecified stage: Secondary | ICD-10-CM | POA: Insufficient documentation

## 2015-04-16 LAB — BASIC METABOLIC PANEL
ANION GAP: 8 (ref 5–15)
BUN: 9 mg/dL (ref 6–20)
CALCIUM: 8.7 mg/dL — AB (ref 8.9–10.3)
CO2: 25 mmol/L (ref 22–32)
Chloride: 106 mmol/L (ref 101–111)
Creatinine, Ser: 0.72 mg/dL (ref 0.44–1.00)
Glucose, Bld: 162 mg/dL — ABNORMAL HIGH (ref 65–99)
Potassium: 3.3 mmol/L — ABNORMAL LOW (ref 3.5–5.1)
Sodium: 139 mmol/L (ref 135–145)

## 2015-04-16 LAB — CBC
HCT: 33 % — ABNORMAL LOW (ref 35.0–47.0)
HEMOGLOBIN: 10.8 g/dL — AB (ref 12.0–16.0)
MCH: 28.6 pg (ref 26.0–34.0)
MCHC: 32.8 g/dL (ref 32.0–36.0)
MCV: 87.1 fL (ref 80.0–100.0)
Platelets: 321 10*3/uL (ref 150–440)
RBC: 3.79 MIL/uL — AB (ref 3.80–5.20)
RDW: 13.2 % (ref 11.5–14.5)
WBC: 12.4 10*3/uL — ABNORMAL HIGH (ref 3.6–11.0)

## 2015-04-16 MED ORDER — POTASSIUM CHLORIDE 20 MEQ PO PACK
40.0000 meq | PACK | Freq: Once | ORAL | Status: AC
  Administered 2015-04-16: 40 meq via ORAL
  Filled 2015-04-16: qty 2

## 2015-04-16 NOTE — Consult Note (Signed)
ORTHOPAEDIC CONSULTATION  REQUESTING PHYSICIAN: Ramonita Lab, MD  Chief Complaint: Right great toe ulcer  HPI: Laura Escobar is a 34 y.o. female who complains of  ulcer to her right great toe. 2 weeks ago started noticing swelling to her right great toe. She had been at the beach. As recently seen by her primary care physician and referred to the ER for diabetic foot infection. Denies any fever or chills  Past Medical History  Diagnosis Date  . Gastroparesis   . Diabetes mellitus without complication   . Bipolar 1 disorder   . Marijuana abuse    History reviewed. No pertinent past surgical history. Social History   Social History  . Marital Status: Single    Spouse Name: N/A  . Number of Children: N/A  . Years of Education: N/A   Social History Main Topics  . Smoking status: Current Every Day Smoker  . Smokeless tobacco: None  . Alcohol Use: No  . Drug Use: Yes    Special: Marijuana  . Sexual Activity: Not Asked   Other Topics Concern  . None   Social History Narrative   Family History  Problem Relation Age of Onset  . Diabetes Mother    No Known Allergies Prior to Admission medications   Medication Sig Start Date End Date Taking? Authorizing Provider  furosemide (LASIX) 40 MG tablet Take 60 mg by mouth daily.   Yes Historical Provider, MD  insulin NPH Human (HUMULIN N,NOVOLIN N) 100 UNIT/ML injection Inject 25-27 Units into the skin 2 (two) times daily. Pt uses 27 units in the morning and 25 units in the evening.   Yes Historical Provider, MD  insulin regular (NOVOLIN R,HUMULIN R) 100 units/mL injection Inject 6 Units into the skin 2 (two) times daily before a meal.    Yes Historical Provider, MD  lisinopril (PRINIVIL,ZESTRIL) 20 MG tablet Take 1 tablet (20 mg total) by mouth daily. 03/18/15  Yes Altamese Dilling, MD  omeprazole (PRILOSEC) 20 MG capsule Take 1 capsule (20 mg total) by mouth daily. 03/18/15  Yes Altamese Dilling, MD  ondansetron (ZOFRAN  ODT) 4 MG disintegrating tablet Take 1 tablet (4 mg total) by mouth every 8 (eight) hours as needed for nausea or vomiting. 03/15/15  Yes Jene Every, MD  ciprofloxacin (CIPRO) 500 MG tablet Take 1 tablet (500 mg total) by mouth 2 (two) times daily. Patient not taking: Reported on 04/15/2015 03/15/15   Jene Every, MD  erythromycin (E-MYCIN) 250 MG tablet Take 1 tablet (250 mg total) by mouth 2 (two) times daily with a meal. Patient not taking: Reported on 04/15/2015 03/18/15   Altamese Dilling, MD   Dg Toe Great Right  04/15/2015   CLINICAL DATA:  Right big toe ulcer and infection for 2 weeks.  EXAM: RIGHT GREAT TOE  COMPARISON:  None.  FINDINGS: Great toe soft tissue swelling is noted. Gas in the distal soft tissues is present compatible with ulceration/infection.  No radiographic evidence of osteomyelitis is identified.  There is no evidence of fracture or subluxation.  IMPRESSION: Distal great toe soft tissue swelling with gas compatible with ulcerations/infection. No radiographic evidence of osteomyelitis.   Electronically Signed   By: Harmon Pier M.D.   On: 04/15/2015 16:51    Positive ROS: All other systems have been reviewed and were otherwise negative with the exception of those mentioned in the HPI and as above.  12 point ROS was performed.  CBC Latest Ref Rng 04/16/2015 04/15/2015 03/17/2015  WBC 3.6 -  11.0 K/uL 12.4(H) 13.8(H) 11.1(H)  Hemoglobin 12.0 - 16.0 g/dL 10.8(L) 11.6(L) 11.0(L)  Hematocrit 35.0 - 47.0 % 33.0(L) 35.7 33.9(L)  Platelets 150 - 440 K/uL 321 341 267     Physical Exam: General: Alert and oriented.  No apparent distress.  Vascular:  Left foot:Dorsalis Pedis:  diminished Posterior Tibial:  diminished secondary to edema  Right foot: Dorsalis Pedis:  diminished Posterior Tibial:  diminished secondary to edema  Neuro:absent protective sensation  Derm: On the distal aspect of the right great toe there is a noted full-thickness ulceration. The ulcer measures  approximately 1.5 cm in diameter. Mixed fibrotic tissue in the central aspect. Debridement with a 15 blade. Drainage was noted from the very is to aspect of the ulceration. There was a small central area that probed to the distal tuft of the great toe. Also noted on the right midfoot was a hemorrhagic blister. I de-roofed this and healthy normal skin was noted plantar to the area.  Ortho/MS: Edema is noted bilaterally with worsening edema to the right foot. No marketed midfoot instability at this time. Mild warmth to the right foot compared to the left foot. There is noted worsening flattening of the right foot compared to the left foot as well.   Assessment: Diabetic foot ulceration concerning for osteomyelitis right great toe  Edema right foot concerning for early Charcot changes.  Plan: I debrided the ulceration excisionally with a 15 blade down to subcutaneous tissue on the right great toe. A wound culture was taken from the ulcerative site and sent for aerobic and anaerobic culture. I also ordered an x-ray of the foot as there is noted edema to the right foot and I want to evaluate for possible early Charcot changes. An MRI was also ordered to evaluate the distal aspect of the right great toe for osteomyelitis. I'm highly suspicious the distal tuft of the great toe is infected and may need to be removed. Discussed this with the patient in detail. I will follow up tomorrow for further evaluation.  Irean Hong, DPM Cell [phone]   04/16/2015 8:56 AM

## 2015-04-16 NOTE — Progress Notes (Signed)
[facility] - [place] at [facility]   PATIENT NAME: Laura Escobar    MR#:  [id number]  DATE OF BIRTH:  Jan 14, 1981  SUBJECTIVE:  CHIEF COMPLAINT:  Patient had wound debridement of the right great toe by podiatry and resting comfortably. Patient is worried about osteomyelitis  REVIEW OF SYSTEMS:  CONSTITUTIONAL: No fever, fatigue or weakness.  EYES: No blurred or double vision.  EARS, NOSE, AND THROAT: No tinnitus or ear pain.  RESPIRATORY: No cough, shortness of breath, wheezing or hemoptysis.  CARDIOVASCULAR: No chest pain, orthopnea, edema.  GASTROINTESTINAL: No nausea, vomiting, diarrhea or abdominal pain.  GENITOURINARY: No dysuria, hematuria.  ENDOCRINE: No polyuria, nocturia,  HEMATOLOGY: No anemia, easy bruising or bleeding SKIN: No rash or lesion. MUSCULOSKELETAL: No joint pain or arthritis.  Right great toe ulcer NEUROLOGIC: No tingling, numbness, weakness.  PSYCHIATRY: No anxiety or depression.   DRUG ALLERGIES:  No Known Allergies  VITALS:  Blood pressure 122/65, pulse 74, temperature 98.6 F (37 C), temperature source Oral, resp. rate 18, height 5\' 7"  (1.702 m), weight 135.671 kg (299 lb 1.6 oz), last menstrual period 03/21/2015, SpO2 98 %.  PHYSICAL EXAMINATION:  GENERAL:  34 y.o.-year-old patient lying in the bed with no acute distress.  EYES: Pupils equal, round, reactive to light and accommodation. No scleral icterus. Extraocular muscles intact.  HEENT: Head atraumatic, normocephalic. Oropharynx and nasopharynx clear.  NECK:  Supple, no jugular venous distention. No thyroid enlargement, no tenderness.  LUNGS: Normal breath sounds bilaterally, no wheezing, rales,rhonchi or crepitation. No use of accessory muscles of respiration.  CARDIOVASCULAR: S1, S2 normal. No murmurs, rubs, or gallops.  ABDOMEN: Soft, nontender, nondistended. Bowel sounds present. No organomegaly or mass.  EXTREMITIES: Status post or debridement of right great toe,  in clean dressing. No pedal edema, cyanosis, or clubbing.  NEUROLOGIC: Cranial nerves II through XII are intact. Muscle strength 5/5 in all extremities. Sensation intact. Gait not checked.  PSYCHIATRIC: The patient is alert and oriented x 3.  SKIN: No obvious rash, lesion, or ulcer.    LABORATORY PANEL:   CBC  Recent Labs Lab 04/16/15 0437  WBC 12.4*  HGB 10.8*  HCT 33.0*  PLT 321   ------------------------------------------------------------------------------------------------------------------  Chemistries   Recent Labs Lab 04/16/15 0437  NA 139  K 3.3*  CL 106  CO2 25  GLUCOSE 162*  BUN 9  CREATININE 0.72  CALCIUM 8.7*   ------------------------------------------------------------------------------------------------------------------  Cardiac Enzymes No results for input(s): TROPONINI in the last 168 hours. ------------------------------------------------------------------------------------------------------------------  RADIOLOGY:  Mr Foot Right Wo Contrast  04/16/2015   CLINICAL DATA:  Ulcer/open wound over great toe for 2 weeks. Possible laceration at the beach. No previous relevant surgery. Evaluate for osteomyelitis. History of diabetes. Initial encounter.  EXAM: MRI OF THE RIGHT FOREFOOT WITHOUT CONTRAST  TECHNIQUE: Multiplanar, multisequence MR imaging was performed. No intravenous contrast was administered.  COMPARISON:  Radiographs 04/15/2015 and 04/16/2015.  FINDINGS: The great toe remains swollen with soft tissue irregularity medially. The soft tissue emphysema and small foreign body demonstrated on radiographs are not well visualized. There is dorsal subcutaneous edema extending throughout the forefoot. No focal fluid collections are demonstrated. On the axial images, there is possible skin irregularity over the plantar aspect of the midfoot which could reflect another small ulcer.  There is prominent T2 hyperintensity throughout the distal phalanx of the  great toe worrisome for early osteomyelitis. No definite cortical destruction identified on the T1 weighted images. There is no definite abnormal signal within the proximal  phalanx.  The additional toes appear normal. There are midfoot degenerative changes with osteophytes and lateral subluxation of the fourth and fifth metatarsals suspicious for a neuropathic joint. There is no bone destruction in this area.  IMPRESSION: 1. Inflammatory changes within the great toe with associated soft tissue emphysema as correlated with recent radiographs. Abnormal T2 signal within the distal phalanx of the great toe is worrisome for early osteomyelitis. 2. Dorsal subcutaneous edema consistent with cellulitis. No evidence of focal abscess. 3. Possible additional area of skin ulceration in the plantar aspect of the midfoot. Correlate clinically. 4. 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   Electronically Signed   By: Carey Bullocks M.D.   On: 04/16/2015 10:16   Dg Foot Complete Right  04/16/2015   CLINICAL DATA:  34 year old female with history of diabetic ulcer for the past 2 weeks on the right great toe.  EXAM: RIGHT FOOT COMPLETE - 3+ VIEW  COMPARISON:  04/15/2015.  FINDINGS: Soft tissue irregularity at the medial aspect of the tip of the great toe again noted, compatible with the reported ulcer. There is some high density material within the soft tissues, presumably debris. The underlying bone appears grossly intact, without definite destructive lesion to strongly suggest underlying osteomyelitis. No acute displaced fracture, subluxation or dislocation.  IMPRESSION: 1. Soft tissue irregularity in the distal aspect of the right great toe, compatible with the reported diabetic ulcer. No underlying bony abnormality to strongly suggest osteomyelitis at this time.   Electronically Signed   By: Trudie Reed M.D.   On: 04/16/2015 10:38   Dg Toe Great Right  04/15/2015   CLINICAL DATA:  Right big toe ulcer and  infection for 2 weeks.  EXAM: RIGHT GREAT TOE  COMPARISON:  None.  FINDINGS: Great toe soft tissue swelling is noted. Gas in the distal soft tissues is present compatible with ulceration/infection.  No radiographic evidence of osteomyelitis is identified.  There is no evidence of fracture or subluxation.  IMPRESSION: Distal great toe soft tissue swelling with gas compatible with ulcerations/infection. No radiographic evidence of osteomyelitis.   Electronically Signed   By: Harmon Pier M.D.   On: 04/15/2015 16:51    EKG:  No orders found for this or any previous visit.  ASSESSMENT AND PLAN:  34 year old female with past medical history of diabetes, hypertension, diabetic neuropathy, GERD who presents to the hospital due to a right great toe ulcer and foul-smelling discharge and noted to have cellulitis.  #1 right foot/great toe cellulitis with early stages of osteomyelitis per MRI- -We'll continue the patient on IV antibiotics with vancomycin, Zosyn. -Appreciate podiatry consult. Patient probably need a PICC line and 6 weeks of IV antibiotics as MRI is positive for early stages of osteomyelitis, will discuss with podiatry  Patient is status post debridement/incision and drainage postop day #0  Clinically patient is afebrile and hemodynamically stable.  #2 diabetes type 2 without complication-continue NPH, regular insulin. Check hemoglobin A1c  #3 GERD-continue Protonix.  #4 hypertension-continue lisinopril.  #5 obesity-lifestyle changes are advised  All the records are reviewed and case discussed with Care Management/Social Workerr. Management plans discussed with the patient, family and they are in agreement.  CODE STATUS: Full code  TOTAL TIME TAKING CARE OF THIS PATIENT: 35 minutes.   POSSIBLE D/C IN 2 DAYS, DEPENDING ON CLINICAL CONDITION.   Ramonita Lab M.D on 04/16/2015 at 2:35 PM  Between 7am to 6pm - Pager - [phone] After 6pm go to [email or web address] - password [email or web address]  [facility]  [place] [facility]  Office  [phone]  CC: Primary care physician; Berneice Gandy, MD

## 2015-04-17 LAB — VANCOMYCIN, TROUGH: VANCOMYCIN TR: 13 ug/mL (ref 10–20)

## 2015-04-17 LAB — GLUCOSE, CAPILLARY: GLUCOSE-CAPILLARY: 279 mg/dL — AB (ref 65–99)

## 2015-04-17 MED ORDER — INSULIN ASPART 100 UNIT/ML ~~LOC~~ SOLN
0.0000 [IU] | Freq: Three times a day (TID) | SUBCUTANEOUS | Status: DC
Start: 2015-04-17 — End: 2015-04-18
  Administered 2015-04-18: 3 [IU] via SUBCUTANEOUS
  Administered 2015-04-18: 7 [IU] via SUBCUTANEOUS
  Filled 2015-04-17: qty 3
  Filled 2015-04-17: qty 7

## 2015-04-17 MED ORDER — FLUOXETINE HCL 10 MG PO CAPS
10.0000 mg | ORAL_CAPSULE | Freq: Every day | ORAL | Status: DC
  Administered 2015-04-17 – 2015-04-18 (×2): 10 mg via ORAL
  Filled 2015-04-17 (×2): qty 1

## 2015-04-17 MED ORDER — COLLAGENASE 250 UNIT/GM EX OINT
TOPICAL_OINTMENT | Freq: Every day | CUTANEOUS | Status: DC
  Administered 2015-04-17 – 2015-04-18 (×2): via TOPICAL
  Filled 2015-04-17: qty 30

## 2015-04-17 NOTE — Progress Notes (Addendum)
[facility] - [place] at [facility]   PATIENT NAME: Laura Escobar    MR#:  [id number]  DATE OF BIRTH:  12-26-1980  SUBJECTIVE:  CHIEF COMPLAINT:  Patient had wound debridement of the right great toe by podiatry and resting comfortably. Her foot pain is manageable, denies any complaints  REVIEW OF SYSTEMS:  CONSTITUTIONAL: No fever, fatigue or weakness.  EYES: No blurred or double vision.  EARS, NOSE, AND THROAT: No tinnitus or ear pain.  RESPIRATORY: No cough, shortness of breath, wheezing or hemoptysis.  CARDIOVASCULAR: No chest pain, orthopnea, edema.  GASTROINTESTINAL: No nausea, vomiting, diarrhea or abdominal pain.  GENITOURINARY: No dysuria, hematuria.  ENDOCRINE: No polyuria, nocturia,  HEMATOLOGY: No anemia, easy bruising or bleeding SKIN: No rash or lesion. MUSCULOSKELETAL: No joint pain or arthritis.  Right great toe ulcer NEUROLOGIC: No tingling, numbness, weakness.  PSYCHIATRY: No anxiety or depression.   DRUG ALLERGIES:  No Known Allergies  VITALS:  Blood pressure 124/71, pulse 73, temperature 97.5 F (36.4 C), temperature source Oral, resp. rate 18, height 5\' 7"  (1.702 m), weight 135.671 kg (299 lb 1.6 oz), last menstrual period 03/21/2015, SpO2 98 %.  PHYSICAL EXAMINATION:  GENERAL:  34 y.o.-year-old patient lying in the bed with no acute distress.  EYES: Pupils equal, round, reactive to light and accommodation. No scleral icterus. Extraocular muscles intact.  HEENT: Head atraumatic, normocephalic. Oropharynx and nasopharynx clear.  NECK:  Supple, no jugular venous distention. No thyroid enlargement, no tenderness.  LUNGS: Normal breath sounds bilaterally, no wheezing, rales,rhonchi or crepitation. No use of accessory muscles of respiration.  CARDIOVASCULAR: S1, S2 normal. No murmurs, rubs, or gallops.  ABDOMEN: Soft, nontender, nondistended. Bowel sounds present. No organomegaly or mass.  EXTREMITIES: Status post or debridement of  right great toe, in clean dressing. No pedal edema, cyanosis, or clubbing.  NEUROLOGIC: Cranial nerves II through XII are intact. Muscle strength 5/5 in all extremities. Sensation intact. Gait not checked.  PSYCHIATRIC: The patient is alert and oriented x 3.  SKIN: No obvious rash, lesion, or ulcer.    LABORATORY PANEL:   CBC  Recent Labs Lab 04/16/15 0437  WBC 12.4*  HGB 10.8*  HCT 33.0*  PLT 321   ------------------------------------------------------------------------------------------------------------------  Chemistries   Recent Labs Lab 04/16/15 0437  NA 139  K 3.3*  CL 106  CO2 25  GLUCOSE 162*  BUN 9  CREATININE 0.72  CALCIUM 8.7*   ------------------------------------------------------------------------------------------------------------------  Cardiac Enzymes No results for input(s): TROPONINI in the last 168 hours. ------------------------------------------------------------------------------------------------------------------  RADIOLOGY:  Mr Foot Right Wo Contrast  04/16/2015   CLINICAL DATA:  Ulcer/open wound over great toe for 2 weeks[place] No previous relevant surgery. Evaluate for osteomyelitis. History of diabetes. Initial encounter.  EXAM: MRI OF THE RIGHT FOREFOOT WITHOUT CONTRAST  TECHNIQUE: Multiplanar, multisequence MR imaging was performed. No intravenous contrast was administered.  COMPARISON:  Radiographs 04/15/2015 and 04/16/2015.  FINDINGS: The great toe remains swollen with soft tissue irregularity medially. The soft tissue emphysema and small foreign body demonstrated on radiographs are not well visualized. There is dorsal subcutaneous edema extending throughout the forefoot. No focal fluid collections are demonstrated. On the axial images, there is possible skin irregularity over the plantar aspect of the midfoot which could reflect another small ulcer.  There is prominent T2 hyperintensity throughout the distal  phalanx of the great toe worrisome for early osteomyelitis. No definite cortical destruction identified on the T1 weighted images. There is no definite abnormal signal  within the proximal phalanx.  The additional toes appear normal. There are midfoot degenerative changes with osteophytes and lateral subluxation of the fourth and fifth metatarsals suspicious for a neuropathic joint. There is no bone destruction in this area.  IMPRESSION: 1. Inflammatory changes within the great toe with associated soft tissue emphysema as correlated with recent radiographs. Abnormal T2 signal within the distal phalanx of the great toe is worrisome for early osteomyelitis. 2. Dorsal subcutaneous edema consistent with cellulitis. No evidence of focal abscess. 3. Possible additional area of skin ulceration in the plantar aspect of the midfoot. Correlate clinically. 4. 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   Electronically Signed   By: Carey Bullocks M.D.   On: 04/16/2015 10:16   Dg Foot Complete Right  04/16/2015   CLINICAL DATA:  34 year old female with history of diabetic ulcer for the past 2 weeks on the right great toe.  EXAM: RIGHT FOOT COMPLETE - 3+ VIEW  COMPARISON:  04/15/2015.  FINDINGS: Soft tissue irregularity at the medial aspect of the tip of the great toe again noted, compatible with the reported ulcer. There is some high density material within the soft tissues, presumably debris. The underlying bone appears grossly intact, without definite destructive lesion to strongly suggest underlying osteomyelitis. No acute displaced fracture, subluxation or dislocation.  IMPRESSION: 1. Soft tissue irregularity in the distal aspect of the right great toe, compatible with the reported diabetic ulcer. No underlying bony abnormality to strongly suggest osteomyelitis at this time.   Electronically Signed   By: Trudie Reed M.D.   On: 04/16/2015 10:38   Dg Toe Great Right  04/15/2015   CLINICAL DATA:  Right big  toe ulcer and infection for 2 weeks.  EXAM: RIGHT GREAT TOE  COMPARISON:  None.  FINDINGS: Great toe soft tissue swelling is noted. Gas in the distal soft tissues is present compatible with ulceration/infection.  No radiographic evidence of osteomyelitis is identified.  There is no evidence of fracture or subluxation.  IMPRESSION: Distal great toe soft tissue swelling with gas compatible with ulcerations/infection. No radiographic evidence of osteomyelitis.   Electronically Signed   By: Harmon Pier M.D.   On: 04/15/2015 16:51    EKG:  No orders found for this or any previous visit.  ASSESSMENT AND PLAN:  34 year old female with past medical history of diabetes, hypertension, diabetic neuropathy, GERD who presents to the hospital due to a right great toe ulcer and foul-smelling discharge and noted to have cellulitis.  #1 right foot/great toe cellulitis with early stages of osteomyelitis per MRI and charcot's rt midfoot -We'll continue the patient on IV antibiotics with vancomycin, Zosyn. -Appreciate podiatry consult. We'll get a PICC line for 6 weeks of IV antibiotics as MRI is positive for early stages of osteomyelitis. Needs outpatient wound care with Santyl dressing. Patient needs hyperbaric oxygen treatment -ID consult is pending regarding antibiotics. Wound culture with the heavy growth of hemolytic organism  Patient is status post debridement/incision and drainage postop day # 1  Clinically patient is afebrile and hemodynamically stable.  #2 diabetes type 2 without complication-continue NPH, regular insulin. Provide insulin sliding scale Check hemoglobin A1c  #3 GERD-continue Protonix.  #4 hypertension-continue lisinopril.  #5 obesity-lifestyle changes are advised  All the records are reviewed and case discussed with Care Management/Social Workerr. Management plans discussed with the patient, family and they are in agreement.  CODE STATUS: Full code  TOTAL TIME TAKING CARE OF THIS  PATIENT: 35 minutes.   POSSIBLE D/C  IN 2 DAYS, DEPENDING ON CLINICAL CONDITION.   Ramonita Lab M.D on 04/17/2015 at 1:36 PM  Between 7am to 6pm - Pager - [phone] After 6pm go to [email or web address] - password [email or web address] [facility]  [facility]  Office  [phone]  CC: Primary care physician; Berneice Gandy, MD

## 2015-04-17 NOTE — Evaluation (Signed)
Physical Therapy Evaluation Patient Details Name: Laura Escobar MRN: [id number] DOB: [birth year] Today's Date: 04/17/2015   History of Present Illness  Pt with R foot wounds  Clinical Impression  Pt is eager to work with PT.  She does well with crutches, but fatigues very quickly with minimal distance and struggles to maintain full NWBing most of the time.  She would likely benefit from a knee scooter.    Follow Up Recommendations Home health PT (per progress?)    Equipment Recommendations   (knee scooter)    Recommendations for Other Services       Precautions / Restrictions Precautions Precautions: Fall Restrictions Weight Bearing Restrictions: Yes RLE Weight Bearing: Non weight bearing      Mobility  Bed Mobility Overal bed mobility: Independent                Transfers Overall transfer level: Modified independent Equipment used: Crutches                Ambulation/Gait Ambulation/Gait assistance: Min guard Ambulation Distance (Feet): 30 Feet Assistive device: Crutches       General Gait Details: Pt fatigues very quickly with crutches and trying to maintain NWBing.  She does at times put weight through her R heel and is more steady and less fatigued with this.    Stairs            Wheelchair Mobility    Modified Rankin (Stroke Patients Only)       Balance                                             Pertinent Vitals/Pain Pain Assessment: No/denies pain    Home Living Family/patient expects to be discharged to:: Private residence Living Arrangements: Children;Spouse/significant other Available Help at Discharge: Family Type of Home: House Home Access: Stairs to enter Entrance Stairs-Rails: Can reach both Entrance Stairs-Number of Steps: 5          Prior Function Level of Independence: Independent         Comments: able to be active and do all that she needed     Hand Dominance         Extremity/Trunk Assessment   Upper Extremity Assessment: Overall WFL for tasks assessed           Lower Extremity Assessment: Overall WFL for tasks assessed         Communication   Communication: No difficulties  Cognition Arousal/Alertness: Awake/alert Behavior During Therapy: WFL for tasks assessed/performed Overall Cognitive Status: Within Functional Limits for tasks assessed                      General Comments      Exercises        Assessment/Plan    PT Assessment Patient needs continued PT services  PT Diagnosis Difficulty walking   PT Problem List Decreased strength;Decreased balance;Decreased mobility;Decreased activity tolerance;Decreased safety awareness;Decreased knowledge of use of DME  PT Treatment Interventions Gait training;Functional mobility training;Therapeutic exercise;Therapeutic activities;Stair training;DME instruction;Neuromuscular re-education   PT Goals (Current goals can be found in the Care Plan section) Acute Rehab PT Goals Patient Stated Goal: "I just want to get back home" PT Goal Formulation: With patient Time For Goal Achievement: 05/01/15 Potential to Achieve Goals: Good    Frequency Min 2X/week   Barriers to discharge  Co-evaluation               End of Session Equipment Utilized During Treatment: Gait belt Activity Tolerance: Patient tolerated treatment well Patient left: in chair           Time: 1242-1259 PT Time Calculation (min) (ACUTE ONLY): 17 min   Charges:   PT Evaluation $Initial PT Evaluation Tier I: 1 Procedure     PT G Codes:       Loran Senters, PT, DPT [phone]  Malachi Pro 04/17/2015, 2:40 PM

## 2015-04-17 NOTE — Progress Notes (Signed)
Daily Progress Note   Subjective  - * No surgery found *  Follow-up of right great toe infection and swelling to right foot. MRI performed yesterday. X-rays also performed of the foot. She's doing well without complaints today.  Objective Filed Vitals:   04/16/15 1506 04/16/15 1957 04/17/15 0409 04/17/15 0745  BP: 133/84 123/75 116/69 124/71  Pulse: 90 93 84 73  Temp: 98.4 F (36.9 C) 98.1 F (36.7 C) 97.8 F (36.6 C) 97.5 F (36.4 C)  TempSrc: Oral Oral Oral Oral  Resp: 18 18 18 18   Height:      Weight:      SpO2: 98% 100% 100% 98%    Physical Exam: Dressing intact with drainage to the distal aspect of the right great toe ulceration.  Laboratory CBC    Component Value Date/Time   WBC 12.4* 04/16/2015 0437   HGB 10.8* 04/16/2015 0437   HCT 33.0* 04/16/2015 0437   PLT 321 04/16/2015 0437    BMET    Component Value Date/Time   NA 139 04/16/2015 0437   K 3.3* 04/16/2015 0437   CL 106 04/16/2015 0437   CO2 25 04/16/2015 0437   GLUCOSE 162* 04/16/2015 0437   BUN 9 04/16/2015 0437   CREATININE 0.72 04/16/2015 0437   CALCIUM 8.7* 04/16/2015 0437   GFRNONAA >60 04/16/2015 0437   GFRAA >60 04/16/2015 0437   MRI: 1. Inflammatory changes within the great toe with associated soft tissue emphysema as correlated with recent radiographs. Abnormal T2 signal within the distal phalanx of the great toe is worrisome for early osteomyelitis. 2. Dorsal subcutaneous edema consistent with cellulitis. No evidence of focal abscess. 3. Possible additional area of skin ulceration in the plantar aspect of the midfoot. Correlate clinically. 4. 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   Assessment/Planning: Osteomyelitis right great toe Charcot right mid foot Diabetes with neuropathy   MRI was concerning for early osteomyelitis of the distal phalanx of the right great toe. No obvious erosive changes were noted. We discussed options including continued conservative  care including local wound care, 6 weeks IV antibiotics, and possible hyperbaric oxygen therapy. We discussed also distal great toe" as well. She wished to proceed with local wound care for now. I recommend infectious disease consult for 6 weeks IV antibiotics and monitoring. We'll begin Santyl dressing changes daily. We'll recommend wound care follow-up for hyperbaric oxygen therapy with osteomyelitis in Escobar diabetic patient. She understands she is still at high risk of possible limitation of the great toe with failure of local wound care and conservative treatment.  MRI also showed that the changes throughout the midfoot with dislocation of the fourth and fifth met cuboid region. I discussed the importance of absolute nonweightbearing and we will need to begin immobilization with Escobar boot. For now will apply some compression with nonweightbearing. I will see her in the outpatient clinic week for immobilization.  Laura Escobar  04/17/2015, 8:38 AM

## 2015-04-17 NOTE — Progress Notes (Signed)
ANTIBIOTIC CONSULT NOTE - INITIAL  Pharmacy Consult for vancomycin dosing Indication: cellulitis  No Known Allergies  Patient Measurements: Height:  (170.2 cm) Weight: 299 lb 1.6 oz (135.671 kg) IBW/kg (Calculated) : 61.6 Adjusted Body Weight: 91 kg  Vital Signs: Temp: 97.8 F (36.6 C) (08/28 0409) Temp Source: Oral (08/28 0409) BP: 116/69 mmHg (08/28 0409) Pulse Rate: 84 (08/28 0409) Intake/Output from previous day: 08/27 0701 - 08/28 0700 In: 320 [P.O.:120; IV Piggyback:200] Out: 0  Intake/Output from this shift: Total I/O In: 320 [P.O.:120; IV Piggyback:200] Out: 0   Labs:  Recent Labs  04/15/15 1608 04/16/15 0437  WBC 13.8* 12.4*  HGB 11.6* 10.8*  PLT 341 321  CREATININE 0.84 0.72   Estimated Creatinine Clearance: 141.3 mL/min (by C-G formula based on Cr of 0.72).  Recent Labs  04/17/15 0437  [facility] 13     Microbiology: No results found for this or any previous visit (from the past 720 hour(s)).  Medical History: Past Medical History  Diagnosis Date  . Gastroparesis   . Diabetes mellitus without complication   . Bipolar 1 disorder   . Marijuana abuse     Medications:   Assessment: No osteomyelitis on inaging  Goal of Therapy:  Vancomycin trough level 10-15 mcg/ml  Plan:  TBW 135.7kg  IBW 61.6kg  DW 91kg Vd 64L kei 0.96 hr-1  t1/2 7 hours 1 gram q 8 hours ordered with stacked dosing. Level before 5th dose.  8/28 AM vanc level 13. Continue current regimen.  Jayten Gabbard S 04/17/2015,6:10 AM

## 2015-04-18 ENCOUNTER — Inpatient Hospital Stay: Payer: Medicare Other

## 2015-04-18 LAB — CBC
HCT: 34.2 % — ABNORMAL LOW (ref 35.0–47.0)
Hemoglobin: 11.2 g/dL — ABNORMAL LOW (ref 12.0–16.0)
MCH: 28.7 pg (ref 26.0–34.0)
MCHC: 32.9 g/dL (ref 32.0–36.0)
MCV: 87.1 fL (ref 80.0–100.0)
PLATELETS: 324 10*3/uL (ref 150–440)
RBC: 3.93 MIL/uL (ref 3.80–5.20)
RDW: 13.1 % (ref 11.5–14.5)
WBC: 11.3 10*3/uL — AB (ref 3.6–11.0)

## 2015-04-18 LAB — BASIC METABOLIC PANEL
Anion gap: 9 (ref 5–15)
BUN: 10 mg/dL (ref 6–20)
CALCIUM: 8.9 mg/dL (ref 8.9–10.3)
CHLORIDE: 98 mmol/L — AB (ref 101–111)
CO2: 27 mmol/L (ref 22–32)
CREATININE: 0.8 mg/dL (ref 0.44–1.00)
GFR calc Af Amer: >60 mL/min (ref >60)
GFR calc non Af Amer: >60 mL/min (ref >60)
Glucose, Bld: 317 mg/dL — ABNORMAL HIGH (ref 65–99)
Potassium: 3.8 mmol/L (ref 3.5–5.1)
SODIUM: 134 mmol/L — AB (ref 135–145)

## 2015-04-18 LAB — HEMOGLOBIN A1C: HEMOGLOBIN A1C: 11 % — AB (ref 4.0–6.0)

## 2015-04-18 LAB — GLUCOSE, CAPILLARY
GLUCOSE-CAPILLARY: 242 mg/dL — AB (ref 65–99)
Glucose-Capillary: 320 mg/dL — ABNORMAL HIGH (ref 65–99)

## 2015-04-18 MED ORDER — HYDROCODONE-ACETAMINOPHEN 5-325 MG PO TABS: 1.0000 | ORAL_TABLET | Freq: Four times a day (QID) | ORAL | Status: DC | PRN

## 2015-04-18 MED ORDER — DEXTROSE 5 % IV SOLN: 2.0000 g | INTRAVENOUS | Status: AC

## 2015-04-18 MED ORDER — FLUOXETINE HCL 10 MG PO CAPS: 10.0000 mg | ORAL_CAPSULE | Freq: Every day | ORAL | Status: DC

## 2015-04-18 MED ORDER — DEXTROSE 5 % IV SOLN
2.0000 g | INTRAVENOUS | Status: DC
  Administered 2015-04-18: 2 g via INTRAVENOUS
  Filled 2015-04-18 (×2): qty 2

## 2015-04-18 MED ORDER — VANCOMYCIN HCL 10 G IV SOLR: 1500.0000 mg | Freq: Two times a day (BID) | INTRAVENOUS | Status: AC

## 2015-04-18 MED ORDER — SACCHAROMYCES BOULARDII 250 MG PO CAPS: 250.0000 mg | ORAL_CAPSULE | Freq: Two times a day (BID) | ORAL | Status: DC

## 2015-04-18 MED ORDER — METRONIDAZOLE 500 MG PO TABS: 500.0000 mg | ORAL_TABLET | Freq: Three times a day (TID) | ORAL | Status: DC

## 2015-04-18 MED ORDER — SODIUM CHLORIDE 0.9 % IJ SOLN
10.0000 mL | Freq: Two times a day (BID) | INTRAMUSCULAR | Status: DC
  Administered 2015-04-18 (×2): 10 mL via INTRAVENOUS

## 2015-04-18 MED ORDER — COLLAGENASE 250 UNIT/GM EX OINT: TOPICAL_OINTMENT | Freq: Every day | CUTANEOUS | Status: DC

## 2015-04-18 MED ORDER — ACETAMINOPHEN 325 MG PO TABS: 650.0000 mg | ORAL_TABLET | Freq: Four times a day (QID) | ORAL | Status: DC | PRN

## 2015-04-18 NOTE — Progress Notes (Signed)
Pt has a picc line inserted earlier this shift for long term IV ABX tx. Tolerated insertion, plan for discharge today.

## 2015-04-18 NOTE — Discharge Summary (Signed)
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 Gastroparesis   . Diabetes mellitus without complication   . Bipolar 1 disorder   . Marijuana abuse     HOSPITAL COURSE:  34 year old female with past medical history of diabetes, hypertension, diabetic neuropathy, GERD who presents to the hospital due to a right great toe ulcer and foul-smelling discharge and noted to have cellulitis.  #1 right foot/great toe cellulitis with early stages of osteomyelitis per MRI and charcot's rt midfoot  Clinically patient is afebrile and hemodynamically stable. Patient is status post debridement/incision and drainage by Podiatry , they've recommended the patient to f/u with them in 3 days for boot placement - Given IV antibiotics  vancomycin, Zosyn during the hospital course -Appreciate podiatry consult. Placed PICC line for 6 weeks of IV antibiotics as MRI is positive for early stages of osteomyelitis. Needs outpatient wound care with Santyl dressing. Patient needs hyperbaric oxygen treatment OP - Wound culture with the heavy growth of hemolytic organism and staph- ID recommended iv Vancomycin, rocephin and falgyl for 6 weeks  #2 diabetes type 2 without complication-continue NPH, regular insulin. Provide insulin sliding scale  #3 GERD-continue Protonix.  #4 hypertension-continue lisinopril.  #5 obesity-lifestyle changes are advised  DISCHARGE CONDITIONS:   satisfactory  CONSULTS OBTAINED:  Treatment Team:  Gwyneth Revels, DPM Clydie Braun, MD   PROCEDURES Incision and draingae and debridment   DRUG ALLERGIES:  No Known Allergies  DISCHARGE MEDICATIONS:   Current Discharge Medication List    START taking these medications   Details  acetaminophen (TYLENOL) 325 MG tablet Take 2 tablets (650 mg total) by mouth every 6 (six) hours as needed for mild pain (or Fever >/= 101).    cefTRIAXone 2 g in dextrose 5 % 50 mL Inject 2 g into the vein daily. Qty: 42 Dose, Refills: 0    collagenase (SANTYL) ointment Apply topically daily. Qty: 90 g, Refills: 0    FLUoxetine (PROZAC) 10 MG capsule Take 1 capsule (10 mg total) by mouth daily. Qty: 30 capsule, Refills: 0    HYDROcodone-acetaminophen (NORCO/VICODIN) 5-325 MG per tablet Take 1-2 tablets by mouth every 6 (six) hours as needed for moderate pain or severe pain. Qty: 30 tablet, Refills: 0    metroNIDAZOLE (FLAGYL) 500 MG tablet Take 1 tablet (500 mg total) by mouth 3 (three) times daily. Qty: 126 tablet, Refills: 0    saccharomyces boulardii (FLORASTOR) 250 MG capsule Take 1 capsule (250 mg total) by mouth 2 (two) times daily. Qty: 90 capsule, Refills: 0      CONTINUE these medications which have NOT CHANGED   Details  furosemide (LASIX) 40 MG tablet Take 60 mg by mouth daily.    insulin NPH Human (HUMULIN N,NOVOLIN N) 100 UNIT/ML injection Inject 25-27 Units into the skin 2 (two) times daily. Pt uses 27 units in the morning and 25 units in the evening.    insulin regular (NOVOLIN R,HUMULIN R) 100 units/mL injection Inject 6 Units into the skin 2 (two) times daily  before a meal.     lisinopril (PRINIVIL,ZESTRIL) 20 MG tablet Take 1 tablet (20 mg total) by mouth daily. Qty: 30 tablet, Refills: 0    omeprazole (PRILOSEC) 20 MG capsule Take 1 capsule (20 mg total) by mouth daily. Qty: 30 capsule, Refills: 0    ondansetron (ZOFRAN ODT) 4 MG disintegrating tablet Take 1 tablet (4 mg total) by mouth every 8 (eight) hours as needed for  nausea or vomiting. Qty: 20 tablet, Refills: 0      STOP taking these medications     ciprofloxacin (CIPRO) 500 MG tablet      erythromycin (E-MYCIN) 250 MG tablet          DISCHARGE INSTRUCTIONS:  Diet - diabetic , low fat Activity - per PT ,non wt bearing on rt foot Home health , with daily santyl dressing changes F/u with podiatry in 3 days  F/u with ID in 2 weeks    DIET:  ADA ,low fat  DISCHARGE CONDITION:  satisfactory  ACTIVITY:  Activity - per PT ,non wt bearing on rt foot  OXYGEN:  Home Oxygen: no Oxygen Delivery: none  DISCHARGE LOCATION:  Home with home health  If you experience worsening of your admission symptoms, develop shortness of breath, life threatening emergency, suicidal or homicidal thoughts you must seek medical attention immediately by calling 911 or calling your MD immediately  if symptoms less severe.  You Must read complete instructions/literature along with all the possible adverse reactions/side effects for all the Medicines you take and that have been prescribed to you. Take any new Medicines after you have completely understood and accpet all the possible adverse reactions/side effects.   Please note  You were cared for by a hospitalist during your hospital stay. If you have any questions about your discharge medications or the care you received while you were in the hospital after you are discharged, you can call the unit and asked to speak with the hospitalist on call if the hospitalist that took care of you is not available. Once you are discharged, your primary care physician will handle any further medical issues. Please note that NO REFILLS for any discharge medications will be authorized once you are discharged, as it is imperative that you return to your primary care physician (or establish a relationship with a primary care physician if you do not have one) for your aftercare needs so that they can reassess your need for medications  and monitor your lab values.     Today  Chief Complaint  Patient presents with  . Toe Pain  . Wound Infection   Pt is feeling kay with no other complaints. Rt foot pain is well controlled with current pain meds  ROS:  CONSTITUTIONAL: Denies fevers, chills. Denies any fatigue, weakness.  EYES: Denies blurry vision, double vision, eye pain. EARS, NOSE, THROAT: Denies tinnitus, ear pain, hearing loss. RESPIRATORY: Denies cough, wheeze, shortness of breath.  CARDIOVASCULAR: Denies chest pain, palpitations, edema.  GASTROINTESTINAL: Denies nausea, vomiting, diarrhea, abdominal pain. Denies bright red blood per rectum. GENITOURINARY: Denies dysuria, hematuria. ENDOCRINE: Denies nocturia or thyroid problems. HEMATOLOGIC AND LYMPHATIC: Denies easy bruising or bleeding. SKIN: Denies rash or lesion. MUSCULOSKELETAL: Denies pain in neck, back, shoulder, knees, hips or arthritic symptoms. Rt foot with clean dressing.  NEUROLOGIC: Denies paralysis, paresthesias.  PSYCHIATRIC: Denies anxiety or depressive symptoms.   VITAL SIGNS:  Blood pressure 122/78, pulse 82, temperature 97.3 F (36.3 C), temperature source Oral, resp. rate 17, height 5\' 7"  (1.702  m), weight 135.671 kg (299 lb 1.6 oz), last menstrual period 03/21/2015, SpO2 99 %.  I/O:   Intake/Output Summary (Last 24 hours) at 04/18/15 0945 Last data filed at 04/17/15 2300  Gross per 24 hour  Intake    200 ml  Output      0 ml  Net    200 ml    PHYSICAL EXAMINATION:  GENERAL:  34 y.o.-year-old patient lying in the bed with no acute distress.  EYES: Pupils equal, round, reactive to light and accommodation. No scleral icterus. Extraocular muscles intact.  HEENT: Head atraumatic, normocephalic. Oropharynx and nasopharynx clear.  NECK:  Supple, no jugular venous distention. No thyroid enlargement, no tenderness.  LUNGS: Normal breath sounds bilaterally, no wheezing, rales,rhonchi or crepitation. No use of accessory muscles of  respiration.  CARDIOVASCULAR: S1, S2 normal. No murmurs, rubs, or gallops.  ABDOMEN: Soft, non-tender, non-distended. Bowel sounds present. No organomegaly or mass.  EXTREMITIES: No pedal edema, cyanosis, or clubbing. RLE with clean dressing NEUROLOGIC: Cranial nerves II through XII are intact. Muscle strength 5/5 in all extremities. Sensation intact. Gait not checked.  PSYCHIATRIC: The patient is alert and oriented x 3.  SKIN: No obvious rash, lesion, or ulcer.   DATA REVIEW:   CBC  Recent Labs Lab 04/18/15 0421  WBC 11.3*  HGB 11.2*  HCT 34.2*  PLT 324    Chemistries   Recent Labs Lab 04/18/15 0421  NA 134*  K 3.8  CL 98*  CO2 27  GLUCOSE 317*  BUN 10  CREATININE 0.80  CALCIUM 8.9    Cardiac Enzymes No results for input(s): TROPONINI in the last 168 hours.  Microbiology Results  Results for orders placed or performed during the hospital encounter of 04/15/15  Wound culture     Status: None (Preliminary result)   Collection Time: 04/16/15  9:08 AM  Result Value Ref Range Status   Specimen Description ULCER  Final   Special Requests Immunocompromised  Final   Gram Stain PENDING  Incomplete   Culture   Final    HEAVY GROWTH GROUP B STREP(S.AGALACTIAE)ISOLATED HOLDING FOR ADDITIONAL POSSIBLE PATHOGEN Virtually 100% of S. agalactiae (Group B) strains are susceptible to Penicillin.  For Penicillin-allergic patients, Erythromycin (85-95% sensitive) and Clindamycin (80% sensitive) are drugs of choice. Contact microbiology lab to request sensitivities if  needed within 7 days.    Report Status PENDING  Incomplete    RADIOLOGY:  Mr Foot Right Wo Contrast  04/16/2015   CLINICAL DATA:  Ulcer/open wound over great toe for 2 weeks[place] No previous relevant surgery. Evaluate for osteomyelitis. History of diabetes. Initial encounter.  EXAM: MRI OF THE RIGHT FOREFOOT WITHOUT CONTRAST  TECHNIQUE: Multiplanar, multisequence MR imaging was  performed. No intravenous contrast was administered.  COMPARISON:  Radiographs 04/15/2015 and 04/16/2015.  FINDINGS: The great toe remains swollen with soft tissue irregularity medially. The soft tissue emphysema and small foreign body demonstrated on radiographs are not well visualized. There is dorsal subcutaneous edema extending throughout the forefoot. No focal fluid collections are demonstrated. On the axial images, there is possible skin irregularity over the plantar aspect of the midfoot which could reflect another small ulcer.  There is prominent T2 hyperintensity throughout the distal phalanx of the great toe worrisome for early osteomyelitis. No definite cortical destruction identified on the T1 weighted images. There is no definite abnormal signal within the proximal phalanx.  The additional toes appear normal. There are midfoot degenerative changes with osteophytes and lateral  subluxation of the fourth and fifth metatarsals suspicious for a neuropathic joint. There is no bone destruction in this area.  IMPRESSION: 1. Inflammatory changes within the great toe with associated soft tissue emphysema as correlated with recent radiographs. Abnormal T2 signal within the distal phalanx of the great toe is worrisome for early osteomyelitis. 2. Dorsal subcutaneous edema consistent with cellulitis. No evidence of focal abscess. 3. Possible additional area of skin ulceration in the plantar aspect of the midfoot. Correlate clinically. 4. 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   Electronically Signed   By: Carey Bullocks M.D.   On: 04/16/2015 10:16   Dg Chest Port 1 View  04/18/2015   CLINICAL DATA:  Check right PICC placement.  Initial encounter.  EXAM: PORTABLE CHEST - 1 VIEW  COMPARISON:  Chest radiograph performed 03/15/2015  FINDINGS: The patient's right PICC is noted ending about the cavoatrial junction.  The lungs appear clear bilaterally. No focal consolidation, pleural effusion or  pneumothorax is seen.  The cardiomediastinal silhouette remains normal in size. No acute osseous abnormalities are identified the  IMPRESSION: Right PICC noted ending about the cavoatrial junction. Lungs clear bilaterally.   Electronically Signed   By: Roanna Raider M.D.   On: 04/18/2015 00:54   Dg Foot Complete Right  04/16/2015   CLINICAL DATA:  34 year old female with history of diabetic ulcer for the past 2 weeks on the right great toe.  EXAM: RIGHT FOOT COMPLETE - 3+ VIEW  COMPARISON:  04/15/2015.  FINDINGS: Soft tissue irregularity at the medial aspect of the tip of the great toe again noted, compatible with the reported ulcer. There is some high density material within the soft tissues, presumably debris. The underlying bone appears grossly intact, without definite destructive lesion to strongly suggest underlying osteomyelitis. No acute displaced fracture, subluxation or dislocation.  IMPRESSION: 1. Soft tissue irregularity in the distal aspect of the right great toe, compatible with the reported diabetic ulcer. No underlying bony abnormality to strongly suggest osteomyelitis at this time.   Electronically Signed   By: Trudie Reed M.D.   On: 04/16/2015 10:38   Dg Toe Great Right  04/15/2015   CLINICAL DATA:  Right big toe ulcer and infection for 2 weeks.  EXAM: RIGHT GREAT TOE  COMPARISON:  None.  FINDINGS: Great toe soft tissue swelling is noted. Gas in the distal soft tissues is present compatible with ulceration/infection.  No radiographic evidence of osteomyelitis is identified.  There is no evidence of fracture or subluxation.  IMPRESSION: Distal great toe soft tissue swelling with gas compatible with ulcerations/infection. No radiographic evidence of osteomyelitis.   Electronically Signed   By: Harmon Pier M.D.   On: 04/15/2015 16:51    EKG:  No orders found for this or any previous visit.    Management plans discussed with the patient, family and they are in agreement.  CODE  STATUS:     Code Status Orders        Start     Ordered   04/15/15 2113  Full code   Continuous     04/15/15 2112      TOTAL TIME TAKING CARE OF THIS PATIENT: 45 minutes.    @[facility] @  on 04/18/2015 at 9:45 AM  Between 7am to 6pm - Pager - [phone]  After 6pm go to [email or web address] - password [email or web address]  [facility]  Office  [phone]  CC: Primary care physician; Berneice Gandy, MD

## 2015-04-18 NOTE — Care Management Important Message (Signed)
Important Message  Patient Details  Name: Laura Escobar MRN: [id number] Date of Birth: [birth year]   Medicare Important Message Given:  Yes-second notification given    [name] 04/18/2015, 9:49 AM

## 2015-04-18 NOTE — Progress Notes (Signed)
Physical Therapy Treatment Patient Details Name: Laura Escobar MRN: [id number] DOB: November 03, 1980 Today's Date: 04/18/2015    History of Present Illness Pt admitted to the hospital for treatment of great toe ulcer/ cellulitis of the right foot     PT Comments    Pt worked on stair navigation today in order for her to be able to enter her home. She was instructed to have her husband help her with the stairs and she seemed to understand the method that we taught her for getting up/down. She still shows fair UE strength and endurance with crutches, but has deficits and needs reinforcement to safely use the crutches. She will continue to benefit from skilled PT in order to address these deficits and return her home safely.   Follow Up Recommendations  Home health PT     Equipment Recommendations  Crutches (knee scooter; gait belt)    Recommendations for Other Services       Precautions / Restrictions Precautions Precautions: Fall Restrictions Weight Bearing Restrictions: Yes RLE Weight Bearing: Non weight bearing    Mobility  Bed Mobility Overal bed mobility: Independent             General bed mobility comments: Pt needs no assistance  Transfers Overall transfer level: Needs assistance Equipment used: Crutches Transfers: Sit to/from Stand Sit to Stand: Min guard         General transfer comment: Pt requires CGA for safety, but demonstrates good use of crutches to her side and functional strength with getting into standing  Ambulation/Gait Ambulation/Gait assistance: Min guard Ambulation Distance (Feet): 25 Feet Assistive device: Crutches       General Gait Details: Pt fatigues very quickly with crutches and trying to maintain NWBing.  She does at times put weight through her R heel and is more steady and less fatigued with this.   (Watch impact of L heel )   Stairs Stairs: Yes Stairs assistance: Min guard Stair Management: Two rails (crab walk on hands and  L foot with back facing stairs) Number of Stairs: 4 General stair comments: Pt needs cueing for hand placement for crab walk up her own stairs. Pt is able to perform this movement with relative ease, but does need assist with rising out of the position to get back into standing once at the top  (Does not have UE strength to do traditional method )  Wheelchair Mobility    Modified Rankin (Stroke Patients Only)       Balance Overall balance assessment: No apparent balance deficits (not formally assessed)                                  Cognition Arousal/Alertness: Awake/alert Behavior During Therapy: WFL for tasks assessed/performed Overall Cognitive Status: Within Functional Limits for tasks assessed                      Exercises      General Comments        Pertinent Vitals/Pain Pain Assessment: No/denies pain    Home Living                      Prior Function            PT Goals (current goals can now be found in the care plan section) Acute Rehab PT Goals PT Goal Formulation: With patient Time For Goal Achievement: 05/01/15 Potential  to Achieve Goals: Good Progress towards PT goals: Progressing toward goals    Frequency  Min 2X/week    PT Plan Current plan remains appropriate    Co-evaluation             End of Session Equipment Utilized During Treatment: Gait belt Activity Tolerance: Patient tolerated treatment well Patient left: in bed;with bed alarm set;with call bell/phone within reach     Time: 1004-1035 PT Time Calculation (min) (ACUTE ONLY): 31 min  Charges:                       G CodesBenna Dunks 04-29-15, 1:04 PM  Benna Dunks, SPT. [phone]

## 2015-04-18 NOTE — Progress Notes (Signed)
Patient discharged to home with [facility] to manage outpatient abx, wound care.  Discharge instructions & medication instructions given. Brother here to take her home.  Nurse retrieved home meds from pharmacy and gave to patient. VSS at time of discharge.

## 2015-04-18 NOTE — Care Management Note (Addendum)
Case Management Note  Patient Details  Name: Laura Escobar MRN: [id number] Date of Birth: September 12, 1980  Subjective/Objective:                  Met with patient who anticipates discharge to home today with her boyfriend and her son. She is not concerned about administering IV antibiotics or learning to change dressings. No labs orders for home per Dr. Margaretmary Eddy but patient will need to follow up with Dr. Ola Spurr for labs. She requested knee walker to help with ambulation; PT pending. Patient contact [phone].  Action/Plan: List of home health agencies left with patient. She would like to use [facility]. Knee board not covered under her insurance-80$/month (patient declined). Referral made to [facility] with [place]. Patient to receive IV dose of Rocepin prior to discharge (0945).  Expected Discharge Date:  04/16/15               Expected Discharge Plan:     In-House Referral:     Discharge planning Services  CM Consult  Post Acute Care Choice:  Home Health, Durable Medical Equipment Choice offered to:  Patient  DME Arranged:    DME Agency:     HH Arranged:  RN, PT, NA [place] Agency:  [place]  Status of Service:  In process, will continue to follow  Medicare Important Message Given:  Yes-second notification given Date Medicare IM Given:    Medicare IM give by:    Date Additional Medicare IM Given:    Additional Medicare Important Message give by:     If discussed at [place] of Stay Meetings, dates discussed:    Additional Comments:  Marshell Garfinkel, RN 04/18/2015, 10:07 AM

## 2015-04-18 NOTE — Consult Note (Signed)
[facility] Infectious Disease     Reason for Consult: Great toe osteomyelitis,    Referring Physician:Gouru Date of Admission:  04/15/2015   Active Problems:   Cellulitis   Pressure ulcer   HPI: Laura Escobar is a 34 y.o. female with poorly controlled DM for > 10 year, recently started on insulin pump by Dr Eddie Dibbles admitted with an ulcer on R great toe with progressive redness, and swelling.  She was admitted and had wbc 13.8, and MRI evidence of great toe emphysema and early osteomyelitis. Dr Vickki Muff performed bedside debridement 8/27.  Cultures are growing Group B strep and staph aureus (sensitivities pending).   She has picc line placed.    Past Medical History  Diagnosis Date  . Gastroparesis   . Diabetes mellitus without complication   . Bipolar 1 disorder   . Marijuana abuse    History reviewed. No pertinent past surgical history. Social History  Substance Use Topics  . Smoking status: Current Every Day Smoker  . Smokeless tobacco: None  . Alcohol Use: No   Family History  Problem Relation Age of Onset  . Diabetes Mother     Allergies: No Known Allergies  Current antibiotics: Antibiotics Given (last 72 hours)    Date/Time Action Medication Dose Rate   04/15/15 2324 Given   vancomycin (VANCOCIN) IVPB 1000 mg/200 mL premix 1,000 mg 200 mL/hr   04/16/15 0539 Given   vancomycin (VANCOCIN) IVPB 1000 mg/200 mL premix 1,000 mg 200 mL/hr   04/16/15 1132 Given   vancomycin (VANCOCIN) IVPB 1000 mg/200 mL premix 1,000 mg 200 mL/hr   04/16/15 2148 Given   vancomycin (VANCOCIN) IVPB 1000 mg/200 mL premix 1,000 mg 200 mL/hr   04/17/15 0454 Given   vancomycin (VANCOCIN) IVPB 1000 mg/200 mL premix 1,000 mg 200 mL/hr   04/17/15 1308 Given   vancomycin (VANCOCIN) IVPB 1000 mg/200 mL premix 1,000 mg 200 mL/hr   04/17/15 2122 Given   vancomycin (VANCOCIN) IVPB 1000 mg/200 mL premix 1,000 mg 200 mL/hr   04/18/15 0521 Given   vancomycin (VANCOCIN) IVPB 1000 mg/200 mL premix  1,000 mg 200 mL/hr   04/18/15 1114 Given   cefTRIAXone (ROCEPHIN) 2 g in dextrose 5 % 50 mL IVPB 2 g 100 mL/hr      MEDICATIONS: . cefTRIAXone (ROCEPHIN)  IV  2 g Intravenous Q24H  . collagenase   Topical Daily  . enoxaparin (LOVENOX) injection  40 mg Subcutaneous Q24H  . FLUoxetine  10 mg Oral Daily  . furosemide  60 mg Oral Daily  . insulin aspart  0-9 Units Subcutaneous TID WC  . insulin aspart  6 Units Subcutaneous BID AC  . insulin detemir  25 Units Subcutaneous QPC supper  . insulin detemir  27 Units Subcutaneous QAC breakfast  . lisinopril  20 mg Oral Daily  . pantoprazole  40 mg Oral QAC breakfast  . sodium chloride  10 mL Intravenous Q12H  . vancomycin  1,000 mg Intravenous Q8H    Review of Systems - 11 systems reviewed and negative per HPI   OBJECTIVE: Temp:  [97.3 F (36.3 C)-98.6 F (37 C)] 97.3 F (36.3 C) (08/29 0805) Pulse Rate:  [78-91] 82 (08/29 0805) Resp:  [17-18] 17 (08/29 0805) BP: (115-153)/(67-81) 122/78 mmHg (08/29 0805) SpO2:  [99 %-100 %] 99 % (08/29 0441) Physical Exam  Constitutional:  oriented to person, place, and time. appears well-developed and well-nourished. No distress. Morbidly obese HENT: [place]/AT, PERRLA, no scleral icterus Mouth/Throat: Oropharynx is clear and  moist. No oropharyngeal exudate.  Cardiovascular: Normal rate, regular rhythm and normal heart sounds. Exam reveals no gallop and no friction rub.  PICC RUE Pulmonary/Chest: Effort normal and breath sounds normal. No respiratory distress.  has no wheezes.  Neck = supple, no nuchal rigidity Abdominal: Soft. Bowel sounds are normal.  exhibits no distension. There is no tenderness.  Lymphadenopathy: no cervical adenopathy. No axillary adenopathy Neurological: alert and oriented to person, place, and time.  Skin: R great toe with open ulcer, some exudate at base, no exposed bone.  Ext 2+ edema RLE, trace LLE Psychiatric: a normal mood and affect.  behavior is normal.     LABS: Results for orders placed or performed during the hospital encounter of 04/15/15 (from the past 48 hour(s))  Vancomycin, trough     Status: None   Collection Time: 04/17/15  4:37 AM  Result Value Ref Range   Vancomycin Tr 13 10 - 20 ug/mL  Glucose, capillary     Status: Abnormal   Collection Time: 04/17/15  9:14 PM  Result Value Ref Range   Glucose-Capillary 279 (H) 65 - 99 mg/dL   Comment 1 Notify RN   Basic metabolic panel     Status: Abnormal   Collection Time: 04/18/15  4:21 AM  Result Value Ref Range   Sodium 134 (L) 135 - 145 mmol/L   Potassium 3.8 3.5 - 5.1 mmol/L   Chloride 98 (L) 101 - 111 mmol/L   CO2 27 22 - 32 mmol/L   Glucose, Bld 317 (H) 65 - 99 mg/dL   BUN 10 6 - 20 mg/dL   Creatinine, Ser 0.80 0.44 - 1.00 mg/dL   Calcium 8.9 8.9 - 10.3 mg/dL   GFR calc non Af Amer >60 >60 mL/min   GFR calc Af Amer >60 >60 mL/min    Comment: (NOTE) The eGFR has been calculated using the CKD EPI equation. This calculation has not been validated in all clinical situations. eGFR's persistently <60 mL/min signify possible Chronic Kidney Disease.    Anion gap 9 5 - 15  CBC     Status: Abnormal   Collection Time: 04/18/15  4:21 AM  Result Value Ref Range   WBC 11.3 (H) 3.6 - 11.0 K/uL   RBC 3.93 3.80 - 5.20 MIL/uL   Hemoglobin 11.2 (L) 12.0 - 16.0 g/dL   HCT 34.2 (L) 35.0 - 47.0 %   MCV 87.1 80.0 - 100.0 fL   MCH 28.7 26.0 - 34.0 pg   MCHC 32.9 32.0 - 36.0 g/dL   RDW 13.1 11.5 - 14.5 %   Platelets 324 150 - 440 K/uL  Glucose, capillary     Status: Abnormal   Collection Time: 04/18/15 11:43 AM  Result Value Ref Range   Glucose-Capillary 242 (H) 65 - 99 mg/dL   Comment 1 Notify RN    No components found for: ESR, C REACTIVE PROTEIN MICRO: Recent Results (from the past 720 hour(s))  Wound culture     Status: None (Preliminary result)   Collection Time: 04/16/15  9:08 AM  Result Value Ref Range Status   Specimen Description ULCER  Final   Special Requests  Immunocompromised  Final   Gram Stain PENDING  Incomplete   Culture   Final    HEAVY GROWTH GROUP B STREP(S.AGALACTIAE)ISOLATED MODERATE GROWTH STAPHYLOCOCCUS AUREUS SUSCEPTIBILITIES TO FOLLOW Virtually 100% of S. agalactiae (Group B) strains are susceptible to Penicillin.  For Penicillin-allergic patients, Erythromycin (85-95% sensitive) and Clindamycin (80% sensitive) are drugs of choice.  Contact microbiology lab to request sensitivities if  needed within 7 days.    Report Status PENDING  Incomplete    IMAGING: Mr Foot Right Wo Contrast  04/16/2015   CLINICAL DATA:  Ulcer/open wound over great toe for 2 weeks[place] No previous relevant surgery. Evaluate for osteomyelitis. History of diabetes. Initial encounter.  EXAM: MRI OF THE RIGHT FOREFOOT WITHOUT CONTRAST  TECHNIQUE: Multiplanar, multisequence MR imaging was performed. No intravenous contrast was administered.  COMPARISON:  Radiographs 04/15/2015 and 04/16/2015.  FINDINGS: The great toe remains swollen with soft tissue irregularity medially. The soft tissue emphysema and small foreign body demonstrated on radiographs are not well visualized. There is dorsal subcutaneous edema extending throughout the forefoot. No focal fluid collections are demonstrated. On the axial images, there is possible skin irregularity over the plantar aspect of the midfoot which could reflect another small ulcer.  There is prominent T2 hyperintensity throughout the distal phalanx of the great toe worrisome for early osteomyelitis. No definite cortical destruction identified on the T1 weighted images. There is no definite abnormal signal within the proximal phalanx.  The additional toes appear normal. There are midfoot degenerative changes with osteophytes and lateral subluxation of the fourth and fifth metatarsals suspicious for a neuropathic joint. There is no bone destruction in this area.  IMPRESSION: 1. Inflammatory changes within the  great toe with associated soft tissue emphysema as correlated with recent radiographs. Abnormal T2 signal within the distal phalanx of the great toe is worrisome for early osteomyelitis. 2. Dorsal subcutaneous edema consistent with cellulitis. No evidence of focal abscess. 3. Possible additional area of skin ulceration in the plantar aspect of the midfoot. Correlate clinically. 4. 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   Electronically Signed   By: Richardean Sale M.D.   On: 04/16/2015 10:16   Dg Chest Port 1 View  04/18/2015   CLINICAL DATA:  Check right PICC placement.  Initial encounter.  EXAM: PORTABLE CHEST - 1 VIEW  COMPARISON:  Chest radiograph performed 03/15/2015  FINDINGS: The patient's right PICC is noted ending about the cavoatrial junction.  The lungs appear clear bilaterally. No focal consolidation, pleural effusion or pneumothorax is seen.  The cardiomediastinal silhouette remains normal in size. No acute osseous abnormalities are identified the  IMPRESSION: Right PICC noted ending about the cavoatrial junction. Lungs clear bilaterally.   Electronically Signed   By: Garald Balding M.D.   On: 04/18/2015 00:54   Dg Foot Complete Right  04/16/2015   CLINICAL DATA:  34 year old female with history of diabetic ulcer for the past 2 weeks on the right great toe.  EXAM: RIGHT FOOT COMPLETE - 3+ VIEW  COMPARISON:  04/15/2015.  FINDINGS: Soft tissue irregularity at the medial aspect of the tip of the great toe again noted, compatible with the reported ulcer. There is some high density material within the soft tissues, presumably debris. The underlying bone appears grossly intact, without definite destructive lesion to strongly suggest underlying osteomyelitis. No acute displaced fracture, subluxation or dislocation.  IMPRESSION: 1. Soft tissue irregularity in the distal aspect of the right great toe, compatible with the reported diabetic ulcer. No underlying bony abnormality to strongly  suggest osteomyelitis at this time.   Electronically Signed   By: Vinnie Langton M.D.   On: 04/16/2015 10:38   Dg Toe Great Right  04/15/2015   CLINICAL DATA:  Right big toe ulcer and infection for 2 weeks.  EXAM: RIGHT GREAT TOE  COMPARISON:  None.  FINDINGS: Great toe soft tissue swelling is noted. Gas in the distal soft tissues is present compatible with ulceration/infection.  No radiographic evidence of osteomyelitis is identified.  There is no evidence of fracture or subluxation.  IMPRESSION: Distal great toe soft tissue swelling with gas compatible with ulcerations/infection. No radiographic evidence of osteomyelitis.   Electronically Signed   By: Margarette Canada M.D.   On: 04/15/2015 16:51    Assessment:   NIMSI MALES is a 34 y.o. female with poorly controlled DM for > 10 year, recently started on insulin pump by Dr Eddie Dibbles admitted with an ulcer on R great toe with progressive redness, and swelling.  She was admitted and had wbc 13.8, and MRI evidence of great toe emphysema and early osteomyelitis. Dr Vickki Muff performed bedside debridement 8/27.  Cultures are growing Group B strep and staph aureus (sensitivities pending).   She has picc line placed.   Recommendations Discussed with patient - will need at least 4 week IV abx -  Vanco and ceftriaxone for now, flagyl for anaerobes given the evidence of empyematous infection I will follow up on culture results and see in 2 weeks time in clinic Being referred to wound center- may be a HBO2 candidate Thank you very much for allowing me to participate in the care of this patient. Please call with questions.   Cheral Marker. Ola Spurr, MD

## 2015-04-18 NOTE — Discharge Instructions (Signed)
Diet - diabetic , low fat Activity - per PT ,non wt bearing on rt foot Home health , with daily santyl dressing changes, and check vancomycin trough on Wednesday, August 31 morning F/u with podiatry in 3 days  F/u with ID in 2 weeks , to consider repeating labs

## 2015-04-18 NOTE — Progress Notes (Addendum)
Infectious Disease Long Term IV Antibiotic Orders  Diagnosis Osteomyelitis, toe Culture results Results for orders placed or performed during the hospital encounter of 04/15/15  Wound culture     Status: None (Preliminary result)   Collection Time: 04/16/15  9:08 AM  Result Value Ref Range Status   Specimen Description ULCER  Final   Special Requests Immunocompromised  Final   Gram Stain PENDING  Incomplete   Culture   Final    HEAVY GROWTH GROUP B STREP(S.AGALACTIAE)ISOLATED MODERATE GROWTH STAPHYLOCOCCUS AUREUS SUSCEPTIBILITIES TO FOLLOW Virtually 100% of S. agalactiae (Group B) strains are susceptible to Penicillin.  For Penicillin-allergic patients, Erythromycin (85-95% sensitive) and Clindamycin (80% sensitive) are drugs of choice. Contact microbiology lab to request sensitivities if  needed within 7 days.    Report Status PENDING  Incomplete     Allergies: No Known Allergies  Discharge antibiotics Vancomycin 1500 mg q 12 hours - vanco trough goal 15-20 - check first trough Wednesday 8/31 am dose  Ceftriaxone 2 grams every       24 hours Oral flagyl 500 q 8 hrs PICC Care per protocol Labs weekly while on IV antibiotics (circle)      CBC w diff   Comprehensive met panel  Stop date  05/16/15  Follow up clinic date TBD FAX weekly labs to [phone]  Leonel Ramsay, MD

## 2015-04-21 LAB — WOUND CULTURE

## 2015-04-25 ENCOUNTER — Other Ambulatory Visit
Admission: RE | Admit: 2015-04-25 | Discharge: 2015-04-25 | Disposition: A | Discharge status: Home / Self Care | Payer: Medicare Other | Attending: Infectious Diseases | Admitting: Infectious Diseases

## 2015-04-25 DIAGNOSIS — E119 Type 2 diabetes mellitus without complications: Secondary | ICD-10-CM | POA: Insufficient documentation

## 2015-04-25 DIAGNOSIS — Z139 Encounter for screening, unspecified: Secondary | ICD-10-CM | POA: Diagnosis present

## 2015-04-25 LAB — COMPREHENSIVE METABOLIC PANEL
ALT: 14 U/L (ref 14–54)
ANION GAP: 7 (ref 5–15)
AST: 13 U/L — ABNORMAL LOW (ref 15–41)
Albumin: 3.3 g/dL — ABNORMAL LOW (ref 3.5–5.0)
Alkaline Phosphatase: 77 U/L (ref 38–126)
BUN: 12 mg/dL (ref 6–20)
CALCIUM: 8.9 mg/dL (ref 8.9–10.3)
CHLORIDE: 97 mmol/L — AB (ref 101–111)
CO2: 30 mmol/L (ref 22–32)
CREATININE: 0.71 mg/dL (ref 0.44–1.00)
Glucose, Bld: 275 mg/dL — ABNORMAL HIGH (ref 65–99)
Potassium: 4.1 mmol/L (ref 3.5–5.1)
SODIUM: 134 mmol/L — AB (ref 135–145)
Total Bilirubin: 0.3 mg/dL (ref 0.3–1.2)
Total Protein: 7.7 g/dL (ref 6.5–8.1)

## 2015-04-25 LAB — CBC WITH DIFFERENTIAL/PLATELET
Basophils Absolute: 0 10*3/uL (ref 0–0.1)
Basophils Relative: 0 %
EOS ABS: 0.1 10*3/uL (ref 0–0.7)
EOS PCT: 1 %
HCT: 38.1 % (ref 35.0–47.0)
Hemoglobin: 12.3 g/dL (ref 12.0–16.0)
LYMPHS ABS: 3.5 10*3/uL (ref 1.0–3.6)
LYMPHS PCT: 32 %
MCH: 28.4 pg (ref 26.0–34.0)
MCHC: 32.2 g/dL (ref 32.0–36.0)
MCV: 88.1 fL (ref 80.0–100.0)
MONO ABS: 0.6 10*3/uL (ref 0.2–0.9)
MONOS PCT: 5 %
Neutro Abs: 6.5 10*3/uL (ref 1.4–6.5)
Neutrophils Relative %: 62 %
PLATELETS: 319 10*3/uL (ref 150–440)
RBC: 4.33 MIL/uL (ref 3.80–5.20)
RDW: 13.3 % (ref 11.5–14.5)
WBC: 10.7 10*3/uL (ref 3.6–11.0)

## 2015-04-25 LAB — VANCOMYCIN, TROUGH: VANCOMYCIN TR: 9 ug/mL — AB (ref 10–20)

## 2015-05-19 ENCOUNTER — Encounter
Admission: RE | Admit: 2015-05-19 | Discharge: 2015-05-19 | Disposition: A | Discharge status: Home / Self Care | Payer: Medicare Other | Source: Ambulatory Visit | Attending: Podiatry | Admitting: Podiatry

## 2015-05-19 DIAGNOSIS — Z01818 Encounter for other preprocedural examination: Secondary | ICD-10-CM | POA: Diagnosis not present

## 2015-05-19 HISTORY — DX: Primary adrenocortical insufficiency: E27.1

## 2015-05-19 HISTORY — DX: Other specified postprocedural states: Z98.890

## 2015-05-19 HISTORY — DX: Nausea with vomiting, unspecified: R11.2

## 2015-05-19 LAB — SURGICAL PCR SCREEN
MRSA, PCR: NEGATIVE
Staphylococcus aureus: NEGATIVE

## 2015-05-19 NOTE — Pre-Procedure Instructions (Signed)
Spoke with Vernona Rieger at Dr. Alycia Patten office, pt is being referred to [facility] cardiology for cardiac clearance.  Surgery has been canceled for tomorrow and will be rescheduled after clearance has been completed.

## 2015-05-19 NOTE — Pre-Procedure Instructions (Signed)
Medical clearance sent to Dr.s Alberteen Spindle & Fowler's offices regarding EKG done at PAT visit today. Spoke with Lequita Halt at Dr. Dory Larsen office.

## 2015-05-19 NOTE — Patient Instructions (Signed)
  Your procedure is scheduled on: Tomorrow Sept 30, 2016 Report to Same Day Surgery. To find out your arrival time please call [phone] between 1PM - 3PM on today.  Remember: Instructions that are not followed completely may result in serious medical risk, up to and including death, or upon the discretion of your surgeon and anesthesiologist your surgery may need to be rescheduled.    _x___ 1. Do not eat food or drink liquids after midnight. No gum chewing or hard candies.     ____ 2. No Alcohol for 24 hours before or after surgery.   ____ 3. Bring all medications with you on the day of surgery if instructed.    __x__ 4. Notify your doctor if there is any change in your medical condition     (cold, fever, infections).     Do not wear jewelry, make-up, hairpins, clips or nail polish.  Do not wear lotions, powders, or perfumes. You may wear deodorant.  Do not shave 48 hours prior to surgery. Men may shave face and neck.  Do not bring valuables to the hospital.    [facility] is not responsible for any belongings or valuables.               Contacts, dentures or bridgework may not be worn into surgery.  Leave your suitcase in the car. After surgery it may be brought to your room.  For patients admitted to the hospital, discharge time is determined by your treatment team.   Patients discharged the day of surgery will not be allowed to drive home.    Please read over the following fact sheets that you were given:   [facility] Preparing for Surgery  _x__ Take these medicines the morning of surgery with A SIP OF WATER:    1. FLUoxetine (PROZAC)  2. gabapentin (NEURONTIN)  3. lisinopril (PRINIVIL,ZESTRIL).   4. Magnesium  5. omeprazole (PRILOSEC)    ____ Fleet Enema (as directed)   __x_ Use CHG Soap as directed  ____ Use inhalers on the day of surgery  ____ Stop metformin 2 days prior to surgery    __x__ Take 1/2 of usual insulin 14 units of NPH at supper tonight and  none on the morning of surgery.   ____ Stop Coumadin/Plavix/aspirin on does not apply.  ____ Stop Anti-inflammatories on does not apply. Can take Tylenol for pain.   ____ Stop supplements until after surgery.    ____ Bring C-Pap to the hospital.

## 2015-05-19 NOTE — OR Nursing (Signed)
As instructed by Dr Henrene Hawking request for clearance called and faxed to Dr Kyla Balzarine with [facility]. Also called and faxed to Dr Dossie Arbour with [facility]

## 2015-05-20 ENCOUNTER — Ambulatory Visit: Admission: RE | Admit: 2015-05-20 | Payer: Medicare Other | Source: Ambulatory Visit

## 2015-05-26 NOTE — OR Nursing (Signed)
Not cleared by Dr Darrold Junker. For chemical stress test 06/01/15

## 2015-05-27 ENCOUNTER — Ambulatory Visit: Admission: RE | Admit: 2015-05-27 | Payer: Medicare Other | Source: Ambulatory Visit | Admitting: Podiatry

## 2015-05-27 ENCOUNTER — Encounter: Admission: RE | Payer: Self-pay | Source: Ambulatory Visit

## 2015-05-27 SURGERY — AMPUTATION, TOE
Anesthesia: Choice | Laterality: Right

## 2015-06-07 NOTE — OR Nursing (Signed)
Patient has been difficult to contact as her phone has no voice mail box.  Discussed the surgery with Dr. Earney MalletFowlers nurse and he wants her to be here for surgery tomorrow on 06/08/15 as her foot/toe is in very bad shape.  Cardiac clearance was obtained from Dr. Darrold JunkerParaschos for this patient.  When I was finally able to speak with the patient, she was confused and frustrated as she was scheduled to return to the cardiologist tomorrow for the results of her work up.  She states that no one mentioned she was to have surgery tomorrow and will be unable to make this happen.  I pushed the concern from the surgeon that she needs to have this surgery done as soon as possible to eliminate the worsening of any infection in the bone.  I asked her to contact the office directly and discuss this with them.  I later contacted the surgeons office to see if she had contacted them and they said "YES".  Since the patient had other commitments for tomorrow and Thursday, the case would be cancelled for 06/08/15.

## 2015-06-08 ENCOUNTER — Encounter: Admission: RE | Payer: Self-pay | Source: Ambulatory Visit

## 2015-06-08 ENCOUNTER — Ambulatory Visit: Admission: RE | Admit: 2015-06-08 | Payer: Medicare Other | Source: Ambulatory Visit | Admitting: Podiatry

## 2015-06-08 SURGERY — AMPUTATION, TOE
Anesthesia: Choice | Laterality: Right

## 2015-06-11 ENCOUNTER — Encounter: Payer: Self-pay | Admitting: *Deleted

## 2015-06-11 ENCOUNTER — Emergency Department
Admission: EM | Admit: 2015-06-11 | Discharge: 2015-06-11 | Discharge status: Against Medical Advice | Payer: Medicare Other | Attending: Emergency Medicine | Admitting: Emergency Medicine

## 2015-06-11 DIAGNOSIS — Z794 Long term (current) use of insulin: Secondary | ICD-10-CM | POA: Diagnosis not present

## 2015-06-11 DIAGNOSIS — E119 Type 2 diabetes mellitus without complications: Secondary | ICD-10-CM | POA: Insufficient documentation

## 2015-06-11 DIAGNOSIS — Z79899 Other long term (current) drug therapy: Secondary | ICD-10-CM | POA: Insufficient documentation

## 2015-06-11 DIAGNOSIS — L97519 Non-pressure chronic ulcer of other part of right foot with unspecified severity: Secondary | ICD-10-CM | POA: Diagnosis not present

## 2015-06-11 DIAGNOSIS — Z720 Tobacco use: Secondary | ICD-10-CM | POA: Insufficient documentation

## 2015-06-11 DIAGNOSIS — L03115 Cellulitis of right lower limb: Secondary | ICD-10-CM

## 2015-06-11 DIAGNOSIS — Z452 Encounter for adjustment and management of vascular access device: Secondary | ICD-10-CM | POA: Diagnosis present

## 2015-06-11 MED ORDER — VANCOMYCIN HCL IN DEXTROSE 1-5 GM/200ML-% IV SOLN
1000.0000 mg | Freq: Once | INTRAVENOUS | Status: DC
Start: 2015-06-11 — End: 2015-06-11

## 2015-06-11 MED ORDER — ACETAMINOPHEN 500 MG PO TABS
1000.0000 mg | ORAL_TABLET | Freq: Once | ORAL | Status: AC
  Administered 2015-06-11: 1000 mg via ORAL
  Filled 2015-06-11: qty 2

## 2015-06-11 MED ORDER — DEXTROSE 5 % IV SOLN: 1.0000 g | Freq: Once | INTRAVENOUS | Status: DC

## 2015-06-11 NOTE — ED Notes (Addendum)
Pt arrived to ED reporting PICC line came out of right arm last night after pt pulled on tape around site. Pt reports bleeding in controlled at this time. No pain reported to the area.

## 2015-06-11 NOTE — ED Notes (Signed)
Pt states she will not be staying for ordered ABX once PICC line is placed. Pt states it's cold at her house because she's had her AC on all day, and doesn't "like driving at night".

## 2015-06-11 NOTE — ED Notes (Signed)
Pt noted to not be in room or bathroom when rounded on. Pt left floor without notifying anyone. MD made aware.

## 2015-06-11 NOTE — ED Provider Notes (Signed)
[facility] Emergency Department Provider Note  ____________________________________________  Time seen: 1655  I have reviewed the triage vital signs and the nursing notes.   HISTORY  Chief Complaint Vascular Access Problem     HPI Laura Escobar is a 34 y.o. female who has an ulcer on her right foot. With the ulcer present, she is being treated with IV antibiotics at home through a PICC line. The PICC line was pulled out accidentally yesterday at 4 PM. Her last dose of antibiotics was yesterday morning. She presents to the emergency department without that a new PICC line could be placed.  Patient denies any acute issues status post loss of PICC line. She is not having any fevers. She has no change in pain.   Dr. Ether Griffins, podiatry, has been managing her foot ulcer.  Past Medical History  Diagnosis Date  . Gastroparesis   . Marijuana abuse   . PONV (postoperative nausea and vomiting)   . Diabetes mellitus without complication (HCC) 1998  . Bipolar 1 disorder (HCC) 2011  . Addison disease [facility]) 2011    Patient Active Problem List   Diagnosis Date Noted  . Pressure ulcer 04/16/2015  . Cellulitis 04/15/2015  . DKA (diabetic ketoacidoses) (HCC) 03/15/2015  . UTI (lower urinary tract infection) 03/15/2015  . Gastroparesis 03/15/2015    Past Surgical History  Procedure Laterality Date  . Abcess drainage N/A     Buttocks 2011, right groin 2013    Current Outpatient Rx  Name  Route  Sig  Dispense  Refill  . acetaminophen (TYLENOL) 325 MG tablet   Oral   Take 2 tablets (650 mg total) by mouth every 6 (six) hours as needed for mild pain (or Fever >/= 101).         . collagenase (SANTYL) ointment   Topical   Apply topically daily.   90 g   0   . FLUoxetine (PROZAC) 10 MG capsule   Oral   Take 1 capsule (10 mg total) by mouth daily. Patient taking differently: Take 10 mg by mouth every morning.    30 capsule   0   . furosemide (LASIX)  40 MG tablet   Oral   Take 40 mg by mouth every morning.          . gabapentin (NEURONTIN) 100 MG capsule   Oral   Take 100 mg by mouth 3 (three) times daily.         Marland Kitchen gabapentin (NEURONTIN) 300 MG capsule   Oral   Take 300 mg by mouth 3 (three) times daily.         Marland Kitchen HYDROcodone-acetaminophen (NORCO/VICODIN) 5-325 MG per tablet   Oral   Take 1-2 tablets by mouth every 6 (six) hours as needed for moderate pain or severe pain.   30 tablet   0   . insulin NPH Human (HUMULIN N,NOVOLIN N) 100 UNIT/ML injection   Subcutaneous   Inject 25-27 Units into the skin 2 (two) times daily. Pt uses 27 units in the morning and 25 units in the evening.         . insulin regular (NOVOLIN R,HUMULIN R) 100 units/mL injection   Subcutaneous   Inject 6 Units into the skin 3 (three) times daily before meals. Unit dosage is 2, 4, 6 depending on number of carbs or 8 if BG is over 200. Pt is on an insulin pump.         Marland Kitchen lisinopril (PRINIVIL,ZESTRIL) 20 MG  tablet   Oral   Take 1 tablet (20 mg total) by mouth daily. Patient taking differently: Take 20 mg by mouth every morning.    30 tablet   0   . Loratadine (CLARITIN) 10 MG CAPS   Oral   Take 10 mg by mouth every morning.         . Magnesium 250 MG TABS   Oral   Take 1 tablet by mouth every morning.         . metroNIDAZOLE (FLAGYL) 500 MG tablet   Oral   Take 1 tablet (500 mg total) by mouth 3 (three) times daily.   126 tablet   0   . omeprazole (PRILOSEC) 20 MG capsule   Oral   Take 1 capsule (20 mg total) by mouth daily.   30 capsule   0   . ondansetron (ZOFRAN ODT) 4 MG disintegrating tablet   Oral   Take 1 tablet (4 mg total) by mouth every 8 (eight) hours as needed for nausea or vomiting.   20 tablet   0   . saccharomyces boulardii (FLORASTOR) 250 MG capsule   Oral   Take 1 capsule (250 mg total) by mouth 2 (two) times daily.   90 capsule   0     Allergies Review of patient's allergies indicates no  known allergies.  Family History  Problem Relation Age of Onset  . Diabetes Mother     Social History Social History  Substance Use Topics  . Smoking status: Current Every Day Smoker -- 0.25 packs/day    Types: Cigarettes  . Smokeless tobacco: None  . Alcohol Use: No     Comment: 1 glasas of wine twice a year    Review of Systems  Constitutional: Negative for fever. ENT: Negative for sore throat. Cardiovascular: Negative for chest pain. Respiratory: Negative for cough. Gastrointestinal: Negative for abdominal pain, vomiting and diarrhea. Genitourinary: Negative for dysuria. Musculoskeletal: Ulcer, right foot. Skin: Ulcer, right foot. Neurological: Negative for paresthesia or weakness   10-point ROS otherwise negative.  ____________________________________________   PHYSICAL EXAM:  VITAL SIGNS: ED Triage Vitals  Enc Vitals Group     BP 06/11/15 1638 130/80 mmHg     Pulse Rate 06/11/15 1638 104     Resp 06/11/15 1638 16     Temp 06/11/15 1638 97.6 F (36.4 C)     Temp Source 06/11/15 1638 Oral     SpO2 06/11/15 1638 98 %     Weight 06/11/15 1638 290 lb (131.543 kg)     Height 06/11/15 1638 5' 7.5" (1.715 m)     Head Cir --      Peak Flow --      Pain Score --      Pain Loc --      Pain Edu? --      Excl. in GC? --     Constitutional: Alert and oriented. Well appearing and in no distress. ENT   Head: Normocephalic and atraumatic.   Nose: No congestion/rhinnorhea.    Cardiovascular: Normal rate, regular rhythm, no murmur noted Respiratory:  Normal respiratory effort, no tachypnea.    Breath sounds are clear and equal bilaterally.  Gastrointestinal: Soft and nontender. No distention.  Back: No muscle spasm, no tenderness, no CVA tenderness. Musculoskeletal: Patient is wearing a protective boot on her right lower leg and foot. This was not removed for examination. Her muscles felt exam otherwise is unremarkable with good range of motion and no  deformities  or swelling noted. Neurologic:  Normal speech and language. No gross focal neurologic deficits are appreciated.  Skin:  Skin is warm, dry. No rash noted. Psychiatric: Mood and affect are normal. Speech and behavior are normal.  ____________________________________________    ____________________________________________   INITIAL IMPRESSION / ASSESSMENT AND PLAN / ED COURSE  Pertinent labs & imaging results that were available during my care of the patient were reviewed by me and considered in my medical decision making (see chart for details).  Patient who is receiving home antibiotic IV treatment through a PICC line. The PICC line was pulled out accidentally yesterday. She will need a new PICC line in order to continue her home therapies. We will start a peripheral line currently and treat with IV antibiotics. I am calling for assistance to see if we can have a PICC line placed today or tomorrow.  ----------------------------------------- 5:15 PM on 06/11/2015 -----------------------------------------  I've spoken with the PICC line placement team. They will send a nurse to place a PICC line in the patient here at [facility].   ----------------------------------------- 7:15 PM on 06/11/2015 -----------------------------------------  The patient is coming frustrated by the delay in the arrival of the PICC line nurse. We have called and spoke with the PICC line team and can and found out that the nurse is currently in the intensive care unit attempting to place a PICC line in a patient there. That situation apparently has become more complicated and is delaying the arrival of the nurse here in the emergency department. We have advised the patient of this and have asked her to be patient.  The nurses here in the emergency department have advised me that the patient is a hard stick. They were unable to establish a peripheral IV. We will wait for the PICC line placement to  administer the vancomycin and ceftriaxone.  ----------------------------------------- 9:01 PM on 06/11/2015 -----------------------------------------  The patient apparently has eloped from the room. She did not notify anyone. The nurses noted that she was no longer present and informed me. I will ask our follow-up nurse to give the patient call and be sure that she has sought care elsewhere or to help her arrange for ongoing care with the placement of a PICC line.    ____________________________________________   FINAL CLINICAL IMPRESSION(S) / ED DIAGNOSES  Final diagnoses:  Cellulitis of right lower extremity  In need of PICC line replacement    Darien Ramus, MD 06/11/15 2354

## 2015-06-13 ENCOUNTER — Emergency Department: Payer: Medicare Other

## 2015-06-13 ENCOUNTER — Emergency Department
Admission: EM | Admit: 2015-06-13 | Discharge: 2015-06-13 | Disposition: A | Discharge status: Home / Self Care | Payer: Medicare Other | Attending: Emergency Medicine | Admitting: Emergency Medicine

## 2015-06-13 DIAGNOSIS — L97519 Non-pressure chronic ulcer of other part of right foot with unspecified severity: Secondary | ICD-10-CM | POA: Insufficient documentation

## 2015-06-13 DIAGNOSIS — E119 Type 2 diabetes mellitus without complications: Secondary | ICD-10-CM | POA: Diagnosis not present

## 2015-06-13 DIAGNOSIS — Z720 Tobacco use: Secondary | ICD-10-CM | POA: Insufficient documentation

## 2015-06-13 DIAGNOSIS — Z95828 Presence of other vascular implants and grafts: Secondary | ICD-10-CM

## 2015-06-13 DIAGNOSIS — Z452 Encounter for adjustment and management of vascular access device: Secondary | ICD-10-CM | POA: Insufficient documentation

## 2015-06-13 NOTE — ED Notes (Signed)
Laura Escobar Vascular at bedside inserting PICC

## 2015-06-13 NOTE — Discharge Instructions (Signed)
PICC Insertion  A peripherally inserted central catheter (PICC) is a long, thin, flexible tube that is inserted into a vein in the upper arm. It is a form of IV access. It is considered to be a "central" line because the tip of the PICC ends in a large vein in your chest. This large vein is called the superior vena cava (SVC). The PICC tip ends in the SVC because there is a lot of blood flow in the SVC. This allows medicines and IV fluids to be quickly distributed throughout the body. The PICC is inserted using a sterile technique by a specially trained health care provider. After the PICC is inserted, a chest X-ray may be done to ensure that it is in the correct place.   LET YOUR HEALTH CARE PROVIDER KNOW ABOUT:   · Any allergies you have.  · All medicines you are taking, including vitamins, herbs, eye drops, creams, and over-the-counter medicines.  · Previous problems you or members of your family have had with the use of anesthetics.  · Any blood disorders you have.  · Previous surgeries you have had.  · Medical conditions you have.  RISKS AND COMPLICATIONS  Generally, this is a safe procedure. However, as with any procedure, complications can occur. Possible complications include:  · Infection at the insertion site or in the blood.  · Bleeding at the insertion site or internally.  · Injury or collapse of the lung.  · Movement or malposition of the PICC.  · Inflammation of the vein (phlebitis).  · Nerve injury or irritation.  · Clot formation at the tip of the PICC line.  · Blood clot in the lung (pulmonary embolus).  · Injury to the large blood vessels or heart (rare).  BEFORE THE PROCEDURE   · Your health care provider may want you to have blood tests. These tests can help tell how well your blood clots.  · If you take blood thinners (anticoagulant medicine), ask your health care provider if you should stop taking them.  PROCEDURE   · You will have to lie flat for about 30-45 minutes for this procedure.  · Your  health care provider will start by identifying a vein into which the PICC line will be placed. This is done using ultrasound or X-ray guidance.  · Medicine is used to numb the skin around the insertion site.  · The skin where the catheter will be inserted is cleaned and covered with a sterile surgical drape.  · A small needle is inserted into the vein, and then a small guidewire is advanced into the superior vena cava.  · The catheter is then advanced over the guidewire and moved into position. The guidewire is then removed.  · The catheter is flushed and blood is drawn back to confirm it is in the vein.  · If this is done without X-ray guidance, an X-ray will be needed to make sure the catheter tip is in the correct position.  · Once the placement is confirmed, the PICC is secured to the skin with a device and covered with a sterile dressing.  AFTER THE PROCEDURE  · You should avoid any strenuous activity for the next 2 days or as directed by your health care provider.  · You will be allowed to go back to your regular activities after the procedure.  · You will be instructed on the care of your PICC line.     This information is not intended to replace   advice given to you by your health care provider. Make sure you discuss any questions you have with your health care provider.     Document Released: 05/27/2013 Document Revised: 08/11/2013 Document Reviewed: 05/27/2013  Elsevier Interactive Patient Education ©2016 [employer]

## 2015-06-13 NOTE — ED Provider Notes (Signed)
[facility] Emergency Department Provider Note     Time seen: ----------------------------------------- 11:45 AM on 06/13/2015 -----------------------------------------    I have reviewed the triage vital signs and the nursing notes.   HISTORY  Chief Complaint Vascular Access Problem    HPI Laura Escobar is a 34 y.o. female who presents ER stating she accidentally pulled her PICC line out. Patient was here Saturday for same but couldn't wait. I could not find peripheral IV access at that time. She is receiving IV antibiotics at home fora foot ulcer. She is pending surgery on the same foot by Dr. Ether Griffins. Patient denies any fevers, chills, chest pain, shortness of breath, nausea vomiting or diarrhea.   Past Medical History  Diagnosis Date  . Gastroparesis   . Marijuana abuse   . PONV (postoperative nausea and vomiting)   . Diabetes mellitus without complication (HCC) 1998  . Bipolar 1 disorder (HCC) 2011  . Addison disease [facility]) 2011    Patient Active Problem List   Diagnosis Date Noted  . Pressure ulcer 04/16/2015  . Cellulitis 04/15/2015  . DKA (diabetic ketoacidoses) (HCC) 03/15/2015  . UTI (lower urinary tract infection) 03/15/2015  . Gastroparesis 03/15/2015    Past Surgical History  Procedure Laterality Date  . Abcess drainage N/A     Buttocks 2011, right groin 2013    Allergies Review of patient's allergies indicates no known allergies.  Social History Social History  Substance Use Topics  . Smoking status: Current Every Day Smoker -- 0.25 packs/day    Types: Cigarettes  . Smokeless tobacco: Not on file  . Alcohol Use: No     Comment: 1 glasas of wine twice a year    Review of Systems Constitutional: Negative for fever. Eyes: Negative for visual changes. ENT: Negative for sore throat. Cardiovascular: Negative for chest pain. Respiratory: Negative for shortness of breath. Gastrointestinal: Negative for abdominal pain,  vomiting and diarrhea. Genitourinary: Negative for dysuria. Musculoskeletal: Negative for back pain. Skin: Positive for right foot ulcer Neurological: Negative for headaches, focal weakness or numbness.  10-point ROS otherwise negative.  ____________________________________________   PHYSICAL EXAM:  VITAL SIGNS: ED Triage Vitals  Enc Vitals Group     BP 06/13/15 1012 164/99 mmHg     Pulse Rate 06/13/15 1012 99     Resp 06/13/15 1012 20     Temp 06/13/15 1012 97.7 F (36.5 C)     Temp Source 06/13/15 1012 Oral     SpO2 06/13/15 1012 100 %     Weight 06/13/15 1012 290 lb (131.543 kg)     Height 06/13/15 1012  (1.702 m)     Head Cir --      Peak Flow --      Pain Score 06/13/15 1012 0     Pain Loc --      Pain Edu? --      Excl. in GC? --     Constitutional: Alert and oriented. Well appearing and in no distress. Eyes: Conjunctivae are normal. PERRL. Normal extraocular movements. ENT   Head: Normocephalic and atraumatic.   Nose: No congestion/rhinnorhea.   Mouth/Throat: Mucous membranes are moist.   Neck: No stridor. Cardiovascular: Normal rate, regular rhythm. Normal and symmetric distal pulses are present in all extremities. No murmurs, rubs, or gallops. Respiratory: Normal respiratory effort without tachypnea nor retractions. Breath sounds are clear and equal bilaterally. No wheezes/rales/rhonchi. Gastrointestinal: Soft and nontender. No distention. No abdominal bruits.  Neurologic:  Normal speech and language.  No gross focal neurologic deficits are appreciated. Speech is normal. No gait instability. Psychiatric: Mood and affect are normal. Speech and behavior are normal. Patient exhibits appropriate insight and judgment. ____________________________________________  ED COURSE:  Pertinent labs & imaging results that were available during my care of the patient were reviewed by me and considered in my medical decision making (see chart for details). I  will order a PICC line replacement. ____________________________________________  FINAL ASSESSMENT AND PLAN  PICC line replacement  Plan: Patient with labs and imaging as dictated above. Patient has had PICC line in place by the vascular team. She is stable for outpatient follow-up   Emily FilbertWilliams, Jelani Trueba E, MD   Emily FilbertJonathan E Dartanion Teo, MD 06/13/15 [phone]

## 2015-06-13 NOTE — ED Notes (Signed)
Pt reports needs her PICC line replaced. Pt states she was here for the same Saturday but couldn't wait.

## 2015-06-13 NOTE — ED Notes (Signed)
Patient reports her PICC line came out Saturday and states "I am giving myself antibiotics 3X a day for a diabetic ulcer on my R foot". Able to establish a 22G in L AC. Patient denies any symptoms or pain at this time

## 2015-06-24 ENCOUNTER — Ambulatory Visit
Admission: RE | Admit: 2015-06-24 | Discharge: 2015-06-24 | Disposition: A | Discharge status: Home / Self Care | Payer: Medicare Other | Source: Ambulatory Visit | Attending: Podiatry | Admitting: Podiatry

## 2015-06-24 ENCOUNTER — Ambulatory Visit: Payer: Medicare Other | Admitting: Anesthesiology

## 2015-06-24 ENCOUNTER — Encounter: Payer: Self-pay | Admitting: *Deleted

## 2015-06-24 ENCOUNTER — Encounter
Admission: RE | Disposition: A | Discharge status: Home / Self Care | Payer: Self-pay | Source: Ambulatory Visit | Attending: Podiatry

## 2015-06-24 DIAGNOSIS — K219 Gastro-esophageal reflux disease without esophagitis: Secondary | ICD-10-CM | POA: Insufficient documentation

## 2015-06-24 DIAGNOSIS — M869 Osteomyelitis, unspecified: Secondary | ICD-10-CM | POA: Insufficient documentation

## 2015-06-24 DIAGNOSIS — E119 Type 2 diabetes mellitus without complications: Secondary | ICD-10-CM | POA: Insufficient documentation

## 2015-06-24 HISTORY — PX: AMPUTATION TOE: SHX6595

## 2015-06-24 LAB — URINE DRUG SCREEN, QUALITATIVE (ARMC ONLY)
Amphetamines, Ur Screen: NOT DETECTED
BARBITURATES, UR SCREEN: NOT DETECTED
Benzodiazepine, Ur Scrn: NOT DETECTED
COCAINE METABOLITE, UR ~~LOC~~: NOT DETECTED
Cannabinoid 50 Ng, Ur ~~LOC~~: POSITIVE — AB
MDMA (ECSTASY) UR SCREEN: NOT DETECTED
METHADONE SCREEN, URINE: NOT DETECTED
OPIATE, UR SCREEN: NOT DETECTED
Phencyclidine (PCP) Ur S: NOT DETECTED
Tricyclic, Ur Screen: NOT DETECTED

## 2015-06-24 LAB — GLUCOSE, CAPILLARY
Glucose-Capillary: 84 mg/dL (ref 65–99)
Glucose-Capillary: 102 mg/dL — ABNORMAL HIGH (ref 65–99)

## 2015-06-24 LAB — POCT PREGNANCY, URINE: Preg Test, Ur: NEGATIVE

## 2015-06-24 SURGERY — AMPUTATION, TOE
Anesthesia: General | Site: Toe | Laterality: Right | Wound class: Dirty or Infected

## 2015-06-24 MED ORDER — ONDANSETRON HCL 4 MG PO TABS: 4.0000 mg | ORAL_TABLET | Freq: Four times a day (QID) | ORAL | Status: DC | PRN

## 2015-06-24 MED ORDER — PROPOFOL 10 MG/ML IV BOLUS
INTRAVENOUS | Status: DC | PRN
  Administered 2015-06-24: 160 mg via INTRAVENOUS

## 2015-06-24 MED ORDER — ONDANSETRON HCL 4 MG/2ML IJ SOLN: 4.0000 mg | Freq: Four times a day (QID) | INTRAMUSCULAR | Status: DC | PRN

## 2015-06-24 MED ORDER — MIDAZOLAM HCL 2 MG/2ML IJ SOLN
INTRAMUSCULAR | Status: DC | PRN
  Administered 2015-06-24: 1 mg via INTRAVENOUS

## 2015-06-24 MED ORDER — SODIUM CHLORIDE 0.9 % IV SOLN
INTRAVENOUS | Status: DC
  Administered 2015-06-24: 10:00:00 via INTRAVENOUS

## 2015-06-24 MED ORDER — CEFAZOLIN SODIUM-DEXTROSE 2-3 GM-% IV SOLR
INTRAVENOUS | Status: AC
  Administered 2015-06-24: 2 g via INTRAVENOUS
  Filled 2015-06-24: qty 50

## 2015-06-24 MED ORDER — BUPIVACAINE HCL 0.5 % IJ SOLN
INTRAMUSCULAR | Status: DC | PRN
  Administered 2015-06-24: 5 mL

## 2015-06-24 MED ORDER — CEFAZOLIN SODIUM-DEXTROSE 2-3 GM-% IV SOLR
INTRAVENOUS | Status: DC | PRN
  Administered 2015-06-24: 2 g via INTRAVENOUS

## 2015-06-24 MED ORDER — LIDOCAINE HCL (CARDIAC) 20 MG/ML IV SOLN
INTRAVENOUS | Status: DC | PRN
Start: 2015-06-24 — End: 2015-06-24
  Administered 2015-06-24: 40 mg via INTRAVENOUS

## 2015-06-24 MED ORDER — HYDROCODONE-ACETAMINOPHEN 5-325 MG PO TABS: 1.0000 | ORAL_TABLET | Freq: Four times a day (QID) | ORAL | Status: DC | PRN

## 2015-06-24 MED ORDER — HYDROMORPHONE HCL 1 MG/ML IJ SOLN: 0.2500 mg | INTRAMUSCULAR | Status: DC | PRN

## 2015-06-24 MED ORDER — FENTANYL CITRATE (PF) 100 MCG/2ML IJ SOLN
INTRAMUSCULAR | Status: DC | PRN
  Administered 2015-06-24: 50 ug via INTRAVENOUS

## 2015-06-24 MED ORDER — PHENYLEPHRINE HCL 10 MG/ML IJ SOLN
INTRAMUSCULAR | Status: DC | PRN
  Administered 2015-06-24: 100 ug via INTRAVENOUS

## 2015-06-24 MED ORDER — HYDROCODONE-ACETAMINOPHEN 5-325 MG PO TABS: 1.0000 | ORAL_TABLET | ORAL | Status: DC | PRN

## 2015-06-24 MED ORDER — CEFAZOLIN SODIUM-DEXTROSE 2-3 GM-% IV SOLR
2.0000 g | Freq: Once | INTRAVENOUS | Status: AC
  Administered 2015-06-24: 2 g via INTRAVENOUS

## 2015-06-24 MED ORDER — EPHEDRINE SULFATE 50 MG/ML IJ SOLN
INTRAMUSCULAR | Status: DC | PRN
  Administered 2015-06-24: 10 mg via INTRAVENOUS

## 2015-06-24 MED ORDER — LIDOCAINE HCL (PF) 1 % IJ SOLN
INTRAMUSCULAR | Status: AC
  Filled 2015-06-24: qty 30

## 2015-06-24 MED ORDER — ONDANSETRON HCL 4 MG/2ML IJ SOLN
INTRAMUSCULAR | Status: DC | PRN
  Administered 2015-06-24: 4 mg via INTRAVENOUS

## 2015-06-24 MED ORDER — SODIUM CHLORIDE 0.9 % IJ SOLN
INTRAMUSCULAR | Status: AC
  Filled 2015-06-24: qty 3

## 2015-06-24 MED ORDER — PROMETHAZINE HCL 25 MG/ML IJ SOLN: 6.2500 mg | INTRAMUSCULAR | Status: DC | PRN

## 2015-06-24 MED ORDER — BUPIVACAINE HCL (PF) 0.5 % IJ SOLN
INTRAMUSCULAR | Status: AC
  Filled 2015-06-24: qty 30

## 2015-06-24 SURGICAL SUPPLY — 38 items
BANDAGE ELASTIC 4 LF NS (GAUZE/BANDAGES/DRESSINGS) ×2 IMPLANT
BANDAGE STRETCH 3X4.1 STRL (GAUZE/BANDAGES/DRESSINGS) ×4 IMPLANT
BLADE OSC/SAGITTAL MD 5.5X18 (BLADE) IMPLANT
BLADE SURG MINI STRL (BLADE) ×2 IMPLANT
BNDG ESMARK 4X12 TAN STRL LF (GAUZE/BANDAGES/DRESSINGS) IMPLANT
BNDG GAUZE 4.5X4.1 6PLY STRL (MISCELLANEOUS) ×2 IMPLANT
CANISTER SUCT 1200ML W/VALVE (MISCELLANEOUS) ×2 IMPLANT
DRAPE FLUOR MINI C-ARM 54X84 (DRAPES) ×2 IMPLANT
DRAPE XRAY CASSETTE 23X24 (DRAPES) ×2 IMPLANT
DURAPREP 26ML APPLICATOR (WOUND CARE) ×2 IMPLANT
GAUZE IODOFORM PACK 1/2 7832 (GAUZE/BANDAGES/DRESSINGS) IMPLANT
GAUZE PETRO XEROFOAM 1X8 (MISCELLANEOUS) ×2 IMPLANT
GAUZE SPONGE 4X4 12PLY STRL (GAUZE/BANDAGES/DRESSINGS) ×2 IMPLANT
GAUZE STRETCH 2X75IN STRL (MISCELLANEOUS) ×2 IMPLANT
GLOVE BIO SURGEON STRL SZ7.5 (GLOVE) ×2 IMPLANT
GLOVE INDICATOR 8.0 STRL GRN (GLOVE) ×2 IMPLANT
GOWN STRL REUS W/ TWL LRG LVL3 (GOWN DISPOSABLE) ×2 IMPLANT
GOWN STRL REUS W/TWL LRG LVL3 (GOWN DISPOSABLE) ×2
HANDPIECE SUCTION TUBG SURGILV (MISCELLANEOUS) ×2 IMPLANT
KIT RM TURNOVER STRD PROC AR (KITS) ×2 IMPLANT
LABEL OR SOLS (LABEL) ×2 IMPLANT
NEEDLE FILTER BLUNT 18X 1/2SAF (NEEDLE) ×1
NEEDLE FILTER BLUNT 18X1 1/2 (NEEDLE) ×1 IMPLANT
NEEDLE HYPO 25X1 1.5 SAFETY (NEEDLE) ×2 IMPLANT
NS IRRIG 500ML POUR BTL (IV SOLUTION) ×2 IMPLANT
PACK EXTREMITY ARMC (MISCELLANEOUS) ×2 IMPLANT
PAD ABD DERMACEA PRESS 5X9 (GAUZE/BANDAGES/DRESSINGS) IMPLANT
PAD GROUND ADULT SPLIT (MISCELLANEOUS) ×2 IMPLANT
RASP SM TEAR CROSS CUT (RASP) ×2 IMPLANT
SOL .9 NS 3000ML IRR  AL (IV SOLUTION)
SOL .9 NS 3000ML IRR UROMATIC (IV SOLUTION) IMPLANT
STOCKINETTE M/LG 89821 (MISCELLANEOUS) ×2 IMPLANT
STRAP SAFETY BODY (MISCELLANEOUS) ×2 IMPLANT
SUT ETHILON 3-0 FS-10 30 BLK (SUTURE) ×2
SUT ETHILON 5-0 FS-2 18 BLK (SUTURE) ×2 IMPLANT
SUT VIC AB 4-0 FS2 27 (SUTURE) IMPLANT
SUTURE EHLN 3-0 FS-10 30 BLK (SUTURE) ×1 IMPLANT
SYRINGE 10CC LL (SYRINGE) ×4 IMPLANT

## 2015-06-24 NOTE — Anesthesia Procedure Notes (Signed)
Procedure Name: LMA Insertion Date/Time: 06/24/2015 10:00 AM Performed by: Henrietta HooverPOPE, Loyal Rudy Pre-anesthesia Checklist: Patient identified, Emergency Drugs available, Suction available, Patient being monitored and Timeout performed Patient Re-evaluated:Patient Re-evaluated prior to inductionOxygen Delivery Method: Circle system utilized Preoxygenation: Pre-oxygenation with 100% oxygen Intubation Type: IV induction Ventilation: Mask ventilation without difficulty LMA: LMA inserted LMA Size: 4.0 Number of attempts: 1 Placement Confirmation: positive ETCO2 and breath sounds checked- equal and bilateral Tube secured with: Tape Dental Injury: Teeth and Oropharynx as per pre-operative assessment

## 2015-06-24 NOTE — Anesthesia Preprocedure Evaluation (Signed)
Anesthesia Evaluation  Patient identified by MRN, date of birth, ID band Patient awake    Reviewed: Allergy & Precautions, NPO status , Patient's Chart, lab work & pertinent test results  History of Anesthesia Complications (+) PONV  Airway Mallampati: III  TM Distance: >3 FB Neck ROM: Full    Dental  (+) Teeth Intact   Pulmonary Current Smoker,    Pulmonary exam normal        Cardiovascular negative cardio ROS Normal cardiovascular exam     Neuro/Psych Bipolar Disorder    GI/Hepatic GERD  Medicated and Controlled,  Endo/Other  diabetes, Type 2BG 84 this morning.  Renal/GU      Musculoskeletal   Abdominal (+) + obese,  Abdomen: soft.    Peds  Hematology   Anesthesia Other Findings   Reproductive/Obstetrics                             Anesthesia Physical Anesthesia Plan  ASA: III  Anesthesia Plan: General   Post-op Pain Management:    Induction: Intravenous  Airway Management Planned: LMA  Additional Equipment:   Intra-op Plan:   Post-operative Plan: Extubation in OR  Informed Consent: I have reviewed the patients History and Physical, chart, labs and discussed the procedure including the risks, benefits and alternatives for the proposed anesthesia with the patient or authorized representative who has indicated his/her understanding and acceptance.     Plan Discussed with: CRNA  Anesthesia Plan Comments:         Anesthesia Quick Evaluation

## 2015-06-24 NOTE — Transfer of Care (Signed)
Immediate Anesthesia Transfer of Care Note  Patient: Laura Escobar  Procedure(s) Performed: Procedure(s): AMPUTATION TOE (Right)  Patient Location: PACU  Anesthesia Type:General  Level of Consciousness: sedated  Airway & Oxygen Therapy: Patient Spontanous Breathing and Patient connected to face mask oxygen  Post-op Assessment: Report given to RN and Post -op Vital signs reviewed and stable  Post vital signs: Reviewed and stable  Last Vitals:  Filed Vitals:   06/24/15 1101  BP: 168/99  Pulse: 92  Temp: 36 C  Resp: 26    Complications: No apparent anesthesia complications

## 2015-06-24 NOTE — Anesthesia Postprocedure Evaluation (Signed)
  Anesthesia Post-op Note  Patient: Laura Escobar  Procedure(s) Performed: Procedure(s): AMPUTATION TOE (Right)  Anesthesia type:General  Patient location: PACU  Post pain: Pain level controlled  Post assessment: Post-op Vital signs reviewed, Patient's Cardiovascular Status Stable, Respiratory Function Stable, Patent Airway and No signs of Nausea or vomiting  Post vital signs: Reviewed and stable  Last Vitals:  Filed Vitals:   06/24/15 1101  BP: 168/99  Pulse: 92  Temp: 36 C  Resp: 26    Level of consciousness: awake, alert  and patient cooperative  Complications: No apparent anesthesia complications

## 2015-06-24 NOTE — Op Note (Signed)
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 She is undergone conservative treatment and presents today for surgery. The risks benefits alternatives and competitions associated have been discussed and consent has been given for a distal right great toe amputation    Procedure:  Patient was brought into the OR and placed on the operating table in thesupine position. After anesthesia was obtained theright lower extremity was prepped and draped in usual sterile fashion.  A fishmouth incision was made at the interphalangeal joint. Dorsal and proximal to this incision was made with good skin flaps noted proximal to the ulcerative site. The toe was then disarticulated at the interphalangeal joint. The wound was flushed with copious amounts or irrigation. Closure was then performed with 4-0 Vicryl for the subcutaneous tissue and a 3-0 nylon for skin. 0.5% Marcaine was placed around the great toe site. [place] a sterile dressing was applied. The placed back in her equalizer walker boot minimal ambulation. A prescription for Norco was prescribed today.    Patient tolerated the procedure and anesthesia well.  Was transported from the OR to the PACU with all vital signs stable and vascular status intact. To be discharged per routine protocol.  Will follow up in approximately 1 week in the outpatient clinic.

## 2015-06-24 NOTE — H&P (Signed)
Laura Escobar is an 34 y.o. female.   Chief Complaint:  <principal problem not specified>   HPI: Presents for amputation right great toe for osteomyelitis  Past Medical History  Diagnosis Date  . Gastroparesis   . Marijuana abuse   . PONV (postoperative nausea and vomiting)   . Diabetes mellitus without complication (HCC) 1998  . Bipolar 1 disorder (HCC) 2011  . Addison disease (HCC) 2011    Past Surgical History  Procedure Laterality Date  . Abcess drainage N/A     Buttocks 2011, right groin 2013    Family History  Problem Relation Age of Onset  . Diabetes Mother    Social History:  reports that she has been smoking Cigarettes.  She has been smoking about 0.25 packs per day. She does not have any smokeless tobacco history on file. She reports that she uses illicit drugs (Marijuana). She reports that she does not drink alcohol.  Allergies: No Known Allergies  ROS  Medications Prior to Admission  Medication Sig Dispense Refill  . FLUoxetine (PROZAC) 10 MG capsule Take 1 capsule (10 mg total) by mouth daily. (Patient taking differently: Take 10 mg by mouth every morning. ) 30 capsule 0  . furosemide (LASIX) 40 MG tablet Take 40 mg by mouth every morning.     . gabapentin (NEURONTIN) 100 MG capsule Take 100 mg by mouth 3 (three) times daily.    Marland Kitchen gabapentin (NEURONTIN) 300 MG capsule Take 300 mg by mouth 3 (three) times daily.    . insulin NPH Human (HUMULIN N,NOVOLIN N) 100 UNIT/ML injection Inject 25-27 Units into the skin 2 (two) times daily. Pt uses 27 units in the morning and 25 units in the evening.    . insulin regular (NOVOLIN R,HUMULIN R) 100 units/mL injection Inject 6 Units into the skin 3 (three) times daily before meals. Unit dosage is 2, 4, 6 depending on number of carbs or 8 if BG is over 200. Pt is on an insulin pump.    Marland Kitchen lisinopril (PRINIVIL,ZESTRIL) 20 MG tablet Take 1 tablet (20 mg total) by mouth daily. (Patient taking differently: Take 20 mg by mouth  every morning. ) 30 tablet 0  . Loratadine (CLARITIN) 10 MG CAPS Take 10 mg by mouth every morning.    . Magnesium 250 MG TABS Take 1 tablet by mouth every morning.    . metroNIDAZOLE (FLAGYL) 500 MG tablet Take 1 tablet (500 mg total) by mouth 3 (three) times daily. 126 tablet 0  . omeprazole (PRILOSEC) 20 MG capsule Take 1 capsule (20 mg total) by mouth daily. 30 capsule 0  . acetaminophen (TYLENOL) 325 MG tablet Take 2 tablets (650 mg total) by mouth every 6 (six) hours as needed for mild pain (or Fever >/= 101).    . collagenase (SANTYL) ointment Apply topically daily. (Patient not taking: Reported on 06/24/2015) 90 g 0  . HYDROcodone-acetaminophen (NORCO/VICODIN) 5-325 MG per tablet Take 1-2 tablets by mouth every 6 (six) hours as needed for moderate pain or severe pain. (Patient not taking: Reported on 06/24/2015) 30 tablet 0  . ondansetron (ZOFRAN ODT) 4 MG disintegrating tablet Take 1 tablet (4 mg total) by mouth every 8 (eight) hours as needed for nausea or vomiting. 20 tablet 0  . saccharomyces boulardii (FLORASTOR) 250 MG capsule Take 1 capsule (250 mg total) by mouth 2 (two) times daily. (Patient not taking: Reported on 06/24/2015) 90 capsule 0    Physical Exam: General: Alert and oriented.  No apparent  distress. Vascular:  Left foot:Dorsalis Pedis:  present Posterior Tibial:  present  Right foot: Dorsalis Pedis:  present Posterior Tibial:  present Neuro:absent protective sensation Derm:open ulceration right great toe distal. Midfoot ulceration right foot is superficial Ortho/MS: contracture of distal right great toe.  Charcot edema right foot.  Rocker bottom right foot.   CV:  RRR.  No murmur Lungs:  CTA b/l Results for orders placed or performed during the hospital encounter of 06/24/15 (from the past 48 hour(s))  Glucose, capillary     Status: None   Collection Time: 06/24/15  9:01 AM  Result Value Ref Range   Glucose-Capillary 84 65 - 99 mg/dL  Pregnancy, urine POC      Status: None   Collection Time: 06/24/15  9:08 AM  Result Value Ref Range   Preg Test, Ur NEGATIVE NEGATIVE    Comment:        THE SENSITIVITY OF THIS METHODOLOGY IS >24 mIU/mL   Urine Drug Screen, Qualitative ([facility] only)     Status: Abnormal   Collection Time: 06/24/15  9:15 AM  Result Value Ref Range   Tricyclic, Ur Screen NONE DETECTED NONE DETECTED   Amphetamines, Ur Screen NONE DETECTED NONE DETECTED   MDMA (Ecstasy)Ur Screen NONE DETECTED NONE DETECTED   Cocaine Metabolite,Ur [place] NONE DETECTED NONE DETECTED   Opiate, Ur Screen NONE DETECTED NONE DETECTED   Phencyclidine (PCP) Ur S NONE DETECTED NONE DETECTED   Cannabinoid 50 Ng, Ur [place] POSITIVE (A) NONE DETECTED   Barbiturates, Ur Screen NONE DETECTED NONE DETECTED   Benzodiazepine, Ur Scrn NONE DETECTED NONE DETECTED   Methadone Scn, Ur NONE DETECTED NONE DETECTED    Comment: (NOTE) 100  Tricyclics, urine               Cutoff 1000 ng/mL 200  Amphetamines, urine             Cutoff 1000 ng/mL 300  MDMA (Ecstasy), urine           Cutoff 500 ng/mL 400  Cocaine Metabolite, urine       Cutoff 300 ng/mL 500  Opiate, urine                   Cutoff 300 ng/mL 600  Phencyclidine (PCP), urine      Cutoff 25 ng/mL 700  Cannabinoid, urine              Cutoff 50 ng/mL 800  Barbiturates, urine             Cutoff 200 ng/mL 900  Benzodiazepine, urine           Cutoff 200 ng/mL 1000 Methadone, urine                Cutoff 300 ng/mL 1100 1200 The urine drug screen provides only a preliminary, unconfirmed 1300 analytical test result and should not be used for non-medical 1400 purposes. Clinical consideration and professional judgment should 1500 be applied to any positive drug screen result due to possible 1600 interfering substances. A more specific alternate chemical method 1700 must be used in order to obtain a confirmed analytical result.  1800 Gas chromato graphy / mass spectrometry (GC/MS) is the preferred 1900 confirmatory method.     No results found.   NM myocardial perfusion SPECT [facility] (stress and rest[facility]  NM myocardial perfusion SPECT multiple (stress and rest)06[facility]03/2015  [facility]  Result Impression   1. Normal left ventricular function  2. Normal wall motion 3. No evidence for scar or ischemia   Result Narrative  Procedure: Pharmacologic Myocardial Perfusion Imaging   TWO day procedure  Indication: Preoperative cardiovascular examination - Plan: NM myocardial  perfusion SPECT multiple (stress and rest), ECG stress test only Ordering Physician:   Dr. Marcina Millard   Clinical History: 34 y.o. year old female Vitals: Height: 58 in Weight: 301 lb Cardiac risk factors include:    Smoking, Hyperlipidemia, Diabetes, HTN and Obesity    Procedure:  Pharmacologic stress testing was performed with Regadenoson using a single  use 0.4mg /24ml (0.08 mg/ml) prefilled syringe intravenously infused as a  bolus dose. The stress test was stopped due to Infusion completion.  Blood  pressure response was normal.    Rest HR: 86bpm Rest BP: 130/41mmHg Max HR: 121bpm Min BP: 132/66mmHg  Stress Test Administered by: Percell Boston, CMA  ECG Interpretation: Rest ECG:  normal sinus rhythm, none Stress ECG:  normal sinus rhythm,  Recovery ECG:  normal sinus rhythm ECG Interpretation:  non-diagnostic due to pharmacologic testing.   Administrations This Visit    regadenoson (LEXISCAN) 0.4 mg/5 mL inj syringe 0.4 mg    Admin Date Action Dose Route Administered By      06/01/2015 Given 0.4 mg Intravenous Scott N Goard, CNMT            technetium Tc41m sestamibi (CARDIOLITE) injection 32.71 millicurie    Admin Date Action Dose Route Administered By      06/01/2015 Given 32.71 millicurie Intravenous Scott N Goard, CNMT            technetium Tc67m sestamibi (CARDIOLITE) injection 35.55 millicurie    Admin Date Action Dose Route  Administered By      06/03/2015 Given 35.55 millicurie Intravenous Stacy R Nabors, CNMT              Gated post-stress perfusion imaging was performed 30 minutes after stress.  Rest images were performed 30 minutes after injection.  Gated LV Analysis:  TID Ratio:   LVEF= 64%  FINDINGS: Regional wall motion:  reveals normal myocardial thickening and wall  motion. The overall quality of the study is good.   Artifacts noted: no Left ventricular cavity: normal.  Perfusion Analysis:  SPECT images demonstrate homogeneous tracer  distribution throughout the myocardium.     Blood pressure 133/91, pulse 79, temperature 96.6 F (35.9 C), temperature source Tympanic, resp. rate 16, height  (1.702 m), weight 131.543 kg (290 lb), last menstrual period 06/15/2015, SpO2 100 %.  Assessment/Plan Osteomyelitis right great toe. Plan for distal amputation right great toe. D/W pt and r/b/a/c and consent given.  Gwyneth Revels A 06/24/2015, 9:49 AM

## 2015-06-24 NOTE — Discharge Instructions (Signed)
[facility] °[facility] ° °POST OPERATIVE INSTRUCTIONS FOR DR. TROXLER AND DR. Ayren Zumbro °[facility] ° ° °1. Take your medication as prescribed.  Pain medication should be taken only as needed. ° °2. Keep the dressing clean, dry and intact. ° °3. Keep your foot elevated above the heart level for the first 48 hours. ° °4. Walking to the bathroom and brief periods of walking are acceptable, unless we have instructed you to be non-weight bearing. ° °5. Always wear your post-op shoe when walking.  Always use your crutches if you are to be non-weight bearing. ° °6. Do not take a shower. Baths are permissible as long as the foot is kept out of the water.  ° °7. Every hour you are awake:  °- Bend your knee 15 times. °- Flex foot 15 times °- Massage calf 15 times ° °8. Call [facility] ([phone]) if any of the following problems occur: °- You develop a temperature or fever. °- The bandage becomes saturated with blood. °- Medication does not stop your pain. °- Injury of the foot occurs. °- Any symptoms of infection including redness, odor, or red streaks running from wound. °-  ° °

## 2015-06-28 LAB — SURGICAL PATHOLOGY

## 2015-09-07 ENCOUNTER — Other Ambulatory Visit: Payer: Self-pay | Admitting: Podiatry

## 2015-09-07 DIAGNOSIS — L97512 Non-pressure chronic ulcer of other part of right foot with fat layer exposed: Secondary | ICD-10-CM

## 2015-09-09 ENCOUNTER — Other Ambulatory Visit: Payer: Self-pay | Admitting: Podiatry

## 2015-09-09 DIAGNOSIS — L97412 Non-pressure chronic ulcer of right heel and midfoot with fat layer exposed: Secondary | ICD-10-CM

## 2015-09-12 ENCOUNTER — Ambulatory Visit: Payer: Medicare Other

## 2015-09-12 ENCOUNTER — Encounter: Admission: RE | Admit: 2015-09-12 | Payer: Medicare Other | Source: Ambulatory Visit

## 2015-09-29 ENCOUNTER — Encounter
Admission: RE | Admit: 2015-09-29 | Discharge: 2015-09-29 | Disposition: A | Discharge status: Home / Self Care | Payer: Medicare Other | Source: Ambulatory Visit | Attending: Podiatry | Admitting: Podiatry

## 2015-09-29 ENCOUNTER — Ambulatory Visit
Admission: RE | Admit: 2015-09-29 | Discharge: 2015-09-29 | Disposition: A | Discharge status: Home / Self Care | Payer: Medicare Other | Source: Ambulatory Visit | Attending: Podiatry | Admitting: Podiatry

## 2015-09-29 DIAGNOSIS — L97412 Non-pressure chronic ulcer of right heel and midfoot with fat layer exposed: Secondary | ICD-10-CM | POA: Diagnosis not present

## 2015-09-29 MED ORDER — TECHNETIUM TC 99M EXAMETAZIME IV KIT
20.2200 | PACK | Freq: Once | INTRAVENOUS | Status: AC | PRN
  Administered 2015-09-29: 20.22 via INTRAVENOUS

## 2016-08-09 ENCOUNTER — Emergency Department
Admission: EM | Admit: 2016-08-09 | Discharge: 2016-08-09 | Disposition: A | Discharge status: Home / Self Care | Payer: Medicare Other | Attending: Emergency Medicine | Admitting: Emergency Medicine

## 2016-08-09 ENCOUNTER — Encounter: Payer: Self-pay | Admitting: Emergency Medicine

## 2016-08-09 DIAGNOSIS — E119 Type 2 diabetes mellitus without complications: Secondary | ICD-10-CM | POA: Insufficient documentation

## 2016-08-09 DIAGNOSIS — R112 Nausea with vomiting, unspecified: Secondary | ICD-10-CM

## 2016-08-09 DIAGNOSIS — R1084 Generalized abdominal pain: Secondary | ICD-10-CM | POA: Diagnosis present

## 2016-08-09 DIAGNOSIS — F12188 Cannabis abuse with other cannabis-induced disorder: Secondary | ICD-10-CM | POA: Insufficient documentation

## 2016-08-09 DIAGNOSIS — G43A0 Cyclical vomiting, not intractable: Secondary | ICD-10-CM

## 2016-08-09 DIAGNOSIS — F17210 Nicotine dependence, cigarettes, uncomplicated: Secondary | ICD-10-CM | POA: Insufficient documentation

## 2016-08-09 DIAGNOSIS — Z79899 Other long term (current) drug therapy: Secondary | ICD-10-CM | POA: Insufficient documentation

## 2016-08-09 DIAGNOSIS — F12988 Cannabis use, unspecified with other cannabis-induced disorder: Secondary | ICD-10-CM

## 2016-08-09 DIAGNOSIS — Z794 Long term (current) use of insulin: Secondary | ICD-10-CM | POA: Insufficient documentation

## 2016-08-09 DIAGNOSIS — R1116 Cannabis hyperemesis syndrome: Secondary | ICD-10-CM

## 2016-08-09 HISTORY — DX: Other acidosis: E87.29

## 2016-08-09 HISTORY — DX: Acidosis: E87.2

## 2016-08-09 LAB — URINALYSIS, COMPLETE (UACMP) WITH MICROSCOPIC
Bacteria, UA: NONE SEEN
GLUCOSE, UA: NEGATIVE mg/dL
KETONES UR: 80 mg/dL — AB
NITRITE: NEGATIVE
PROTEIN: 100 mg/dL — AB
Specific Gravity, Urine: 1.035 — ABNORMAL HIGH (ref 1.005–1.030)
pH: 5 (ref 5.0–8.0)

## 2016-08-09 LAB — URINE DRUG SCREEN, QUALITATIVE (ARMC ONLY)
AMPHETAMINES, UR SCREEN: NOT DETECTED
BARBITURATES, UR SCREEN: NOT DETECTED
BENZODIAZEPINE, UR SCRN: NOT DETECTED
Cannabinoid 50 Ng, Ur ~~LOC~~: NOT DETECTED
Cocaine Metabolite,Ur ~~LOC~~: NOT DETECTED
MDMA (Ecstasy)Ur Screen: NOT DETECTED
Methadone Scn, Ur: NOT DETECTED
OPIATE, UR SCREEN: NOT DETECTED
PHENCYCLIDINE (PCP) UR S: NOT DETECTED
Tricyclic, Ur Screen: NOT DETECTED

## 2016-08-09 LAB — COMPREHENSIVE METABOLIC PANEL
ALK PHOS: 91 U/L (ref 38–126)
ALT: 11 U/L — ABNORMAL LOW (ref 14–54)
ANION GAP: 10 (ref 5–15)
AST: 13 U/L — ABNORMAL LOW (ref 15–41)
Albumin: 3.9 g/dL (ref 3.5–5.0)
BILIRUBIN TOTAL: 0.3 mg/dL (ref 0.3–1.2)
BUN: 13 mg/dL (ref 6–20)
CALCIUM: 9.3 mg/dL (ref 8.9–10.3)
CO2: 21 mmol/L — ABNORMAL LOW (ref 22–32)
Chloride: 105 mmol/L (ref 101–111)
Creatinine, Ser: 1.02 mg/dL — ABNORMAL HIGH (ref 0.44–1.00)
GFR calc non Af Amer: >60 mL/min (ref >60)
Glucose, Bld: 191 mg/dL — ABNORMAL HIGH (ref 65–99)
POTASSIUM: 3.8 mmol/L (ref 3.5–5.1)
SODIUM: 136 mmol/L (ref 135–145)
TOTAL PROTEIN: 7.6 g/dL (ref 6.5–8.1)

## 2016-08-09 LAB — CBC
HCT: 45.3 % (ref 35.0–47.0)
HEMOGLOBIN: 15.6 g/dL (ref 12.0–16.0)
MCH: 30 pg (ref 26.0–34.0)
MCHC: 34.5 g/dL (ref 32.0–36.0)
MCV: 86.8 fL (ref 80.0–100.0)
Platelets: 351 10*3/uL (ref 150–440)
RBC: 5.22 MIL/uL — AB (ref 3.80–5.20)
RDW: 13.4 % (ref 11.5–14.5)
WBC: 14.6 10*3/uL — ABNORMAL HIGH (ref 3.6–11.0)

## 2016-08-09 LAB — LIPASE, BLOOD: Lipase: 14 U/L (ref 11–51)

## 2016-08-09 LAB — POCT PREGNANCY, URINE: Preg Test, Ur: NEGATIVE

## 2016-08-09 MED ORDER — ONDANSETRON 4 MG PO TBDP: ORAL_TABLET | ORAL | 0 refills | Status: DC

## 2016-08-09 MED ORDER — DICYCLOMINE HCL 10 MG PO CAPS: 10.0000 mg | ORAL_CAPSULE | Freq: Four times a day (QID) | ORAL | 0 refills | Status: DC

## 2016-08-09 MED ORDER — PENICILLIN G BENZATHINE & PROC 1200000 UNIT/2ML IM SUSP
2.4000 10*6.[IU] | Freq: Once | INTRAMUSCULAR | Status: AC
  Administered 2016-08-09: 2.4 10*6.[IU] via INTRAMUSCULAR
  Filled 2016-08-09 (×2): qty 4

## 2016-08-09 MED ORDER — DICYCLOMINE HCL 10 MG PO CAPS
10.0000 mg | ORAL_CAPSULE | Freq: Once | ORAL | Status: AC
Start: 2016-08-09 — End: 2016-08-09
  Administered 2016-08-09: 10 mg via ORAL
  Filled 2016-08-09: qty 1

## 2016-08-09 MED ORDER — PROMETHAZINE HCL 25 MG RE SUPP: 25.0000 mg | Freq: Four times a day (QID) | RECTAL | 1 refills | Status: DC | PRN

## 2016-08-09 NOTE — ED Notes (Signed)
Dr. York CeriseForbach at bedside. Family at bedside.

## 2016-08-09 NOTE — ED Provider Notes (Signed)
[facility] Emergency Department Provider Note  ____________________________________________   None    (approximate)  I have reviewed the triage vital signs and the nursing notes.   HISTORY  Chief Complaint Abdominal Pain    HPI Laura Escobar is a 35 y.o. female [place] history of insulin-dependent diabetes, bipolar disorder on olanzapine, and prior episodes of gastroparesis and cyclical vomiting syndrome thought to be secondary to cannabinoid hyperemesis syndrome.  She presents today for evaluation of persistent vomiting and generalized abdominal pain for least one week.  [facility] in the last week for the same symptoms.  She said that they will not give her any medicine for pain and that she is not doing any better.  She has some Zofran but it does not seem to be controlling the symptoms.  She continues to smoke marijuana regularly and that is not helping her.  She denies fever/chills, chest pain, shortness of breath, diarrhea, constipation, dysuria.  She was diagnosed with a strep urinary tract infection sensitive to penicillins as per the [facility], but she states she has not been able to take her Augmentin.  She says that her symptoms are severe, persistent, nothing is making them better or worse.  She has been able to keep down Pedialyte on a relatively regular basis but has not been eating much food. Of note she did drink a full bottle of Coke in triage before she came back to her room and has not had any vomiting during the 3-1/2 hours in the emergency department.     Past Medical History:  Diagnosis Date  . Addison disease (HCC) 2011  . Bipolar 1 disorder (HCC) 2011  . Diabetes mellitus without complication (HCC) 1998  . Gastroparesis   . Ketoacidosis   . Marijuana abuse   . PONV (postoperative nausea and vomiting)     Patient Active Problem List   Diagnosis Date Noted  . Pressure ulcer 04/16/2015  .  Cellulitis 04/15/2015  . DKA (diabetic ketoacidoses) (HCC) 03/15/2015  . UTI (lower urinary tract infection) 03/15/2015  . Gastroparesis 03/15/2015    Past Surgical History:  Procedure Laterality Date  . ABCESS DRAINAGE N/A    Buttocks 2011, right groin 2013  . AMPUTATION TOE Right 06/24/2015   Procedure: AMPUTATION TOE;  Surgeon: Gwyneth RevelsJustin Fowler, DPM;  Location: [facility] ORS;  Service: Podiatry;  Laterality: Right;    Prior to Admission medications   Medication Sig Start Date End Date Taking? Authorizing Provider  acetaminophen (TYLENOL) 325 MG tablet Take 2 tablets (650 mg total) by mouth every 6 (six) hours as needed for mild pain (or Fever >/= 101). 04/18/15   Ramonita LabAruna Gouru, MD  collagenase (SANTYL) ointment Apply topically daily. Patient not taking: Reported on 06/24/2015 04/18/15   Ramonita LabAruna Gouru, MD  dicyclomine (BENTYL) 10 MG capsule Take 1 capsule (10 mg total) by mouth 4 (four) times daily. You may stop taking it after your symptoms improve. 08/09/16 08/23/16  Loleta Roseory Willetta York, MD  FLUoxetine (PROZAC) 10 MG capsule Take 1 capsule (10 mg total) by mouth daily. Patient taking differently: Take 10 mg by mouth every morning.  04/18/15   Ramonita LabAruna Gouru, MD  furosemide (LASIX) 40 MG tablet Take 40 mg by mouth every morning.     Historical Provider, MD  gabapentin (NEURONTIN) 300 MG capsule Take 300 mg by mouth 3 (three) times daily.    Historical Provider, MD  HYDROcodone-acetaminophen (NORCO) 5-325 MG tablet Take 1 tablet by  mouth every 6 (six) hours as needed for moderate pain. 06/24/15   Gwyneth RevelsJustin Fowler, DPM  insulin NPH Human (HUMULIN N,NOVOLIN N) 100 UNIT/ML injection Inject 25-27 Units into the skin 2 (two) times daily. Pt uses 27 units in the morning and 25 units in the evening.    Historical Provider, MD  insulin regular (NOVOLIN R,HUMULIN R) 100 units/mL injection Inject 6 Units into the skin 3 (three) times daily before meals. Unit dosage is 2, 4, 6 depending on number of carbs or 8 if BG is over  200. Pt is on an insulin pump.    Historical Provider, MD  lisinopril (PRINIVIL,ZESTRIL) 20 MG tablet Take 1 tablet (20 mg total) by mouth daily. Patient taking differently: Take 20 mg by mouth every morning.  03/18/15   Altamese DillingVaibhavkumar Vachhani, MD  Loratadine (CLARITIN) 10 MG CAPS Take 10 mg by mouth every morning.    Historical Provider, MD  Magnesium 250 MG TABS Take 1 tablet by mouth every morning.    Historical Provider, MD  metroNIDAZOLE (FLAGYL) 500 MG tablet Take 1 tablet (500 mg total) by mouth 3 (three) times daily. 04/18/15   Ramonita LabAruna Gouru, MD  omeprazole (PRILOSEC) 20 MG capsule Take 1 capsule (20 mg total) by mouth daily. 03/18/15   Altamese DillingVaibhavkumar Vachhani, MD  ondansetron (ZOFRAN ODT) 4 MG disintegrating tablet Allow 1-2 tablets to dissolve in your mouth every 8 hours as needed for nausea/vomiting 08/09/16   Loleta Roseory Bridger Pizzi, MD  promethazine (PHENERGAN) 25 MG suppository Place 1 suppository (25 mg total) rectally every 6 (six) hours as needed for nausea. 08/09/16 08/09/17  Loleta Roseory Johnetta Sloniker, MD  saccharomyces boulardii (FLORASTOR) 250 MG capsule Take 1 capsule (250 mg total) by mouth 2 (two) times daily. Patient not taking: Reported on 06/24/2015 04/18/15   Ramonita LabAruna Gouru, MD    Allergies Patient has no known allergies.  Family History  Problem Relation Age of Onset  . Diabetes Mother     Social History Social History  Substance Use Topics  . Smoking status: Current Every Day Smoker    Packs/day: 0.25    Types: Cigarettes  . Smokeless tobacco: Never Used  . Alcohol use No     Comment: 1 glasas of wine twice a year    Review of Systems Constitutional: No fever/chills Eyes: No visual changes. ENT: No sore throat. Cardiovascular: Denies chest pain. Respiratory: Denies shortness of breath. Gastrointestinal: Generalized abdominal pain with persistent vomiting times at least one week Genitourinary: Negative for dysuria. Musculoskeletal: Negative for back pain. Skin: Negative for  rash. Neurological: Negative for headaches, focal weakness or numbness.  10-point ROS otherwise negative.  ____________________________________________   PHYSICAL EXAM:  VITAL SIGNS: ED Triage Vitals  Enc Vitals Group     BP 08/09/16 1233 (!) 136/93     Pulse Rate 08/09/16 1233 (!) 113     Resp 08/09/16 1233 20     Temp 08/09/16 1233 97.5 F (36.4 C)     Temp Source 08/09/16 1233 Oral     SpO2 08/09/16 1233 100 %     Weight 08/09/16 1236 286 lb (129.7 kg)     Height 08/09/16 1236 5' 7.5" (1.715 m)     Head Circumference --      Peak Flow --      Pain Score 08/09/16 1236 7     Pain Loc --      Pain Edu? --      Excl. in GC? --     Constitutional: Alert and oriented. Well  appearing and in no acute distress. Eyes: Conjunctivae are normal. PERRL. EOMI. Head: Atraumatic. Nose: No congestion/rhinnorhea. Mouth/Throat: Mucous membranes are moist.  Oropharynx non-erythematous. Neck: No stridor.  No meningeal signs.   Cardiovascular: Normal rate, regular rhythm. Good peripheral circulation. Grossly normal heart sounds. Respiratory: Normal respiratory effort.  No retractions. Lungs CTAB. Gastrointestinal: Obese, Soft, only very mild tenderness to palpation of the epigastrium.  No right upper quadrant tenderness, negative Murphy sign, no tenderness to the right lower quadrant, no guarding and no rebound. Musculoskeletal: No lower extremity tenderness nor edema. No gross deformities of extremities. Neurologic:  Normal speech and language. No gross focal neurologic deficits are appreciated.  Skin:  Skin is warm, dry and intact. No rash noted. Psychiatric: Mood and affect are normal. Speech and behavior are normal.  ____________________________________________   LABS (all labs ordered are listed, but only abnormal results are displayed)  Labs Reviewed  COMPREHENSIVE METABOLIC PANEL - Abnormal; Notable for the following:       Result Value   CO2 21 (*)    Glucose, Bld 191 (*)     Creatinine, Ser 1.02 (*)    AST 13 (*)    ALT 11 (*)    All other components within normal limits  CBC - Abnormal; Notable for the following:    WBC 14.6 (*)    RBC 5.22 (*)    All other components within normal limits  URINALYSIS, COMPLETE (UACMP) WITH MICROSCOPIC - Abnormal; Notable for the following:    Color, Urine AMBER (*)    APPearance HAZY (*)    Specific Gravity, Urine 1.035 (*)    Hgb urine dipstick MODERATE (*)    Bilirubin Urine SMALL (*)    Ketones, ur 80 (*)    Protein, ur 100 (*)    Leukocytes, UA TRACE (*)    Squamous Epithelial / LPF 6-30 (*)    All other components within normal limits  URINE CULTURE  LIPASE, BLOOD  URINE DRUG SCREEN, QUALITATIVE ([facility] ONLY)  POC URINE PREG, ED  POCT PREGNANCY, URINE   ____________________________________________  EKG  None - EKG not ordered by ED physician ____________________________________________  RADIOLOGY   No results found.  ____________________________________________   PROCEDURES  Procedure(s) performed:   Procedures   Critical Care performed: No ____________________________________________   INITIAL IMPRESSION / ASSESSMENT AND PLAN / ED COURSE  Pertinent labs & imaging results that were available during my care of the patient were reviewed by me and considered in my medical decision making (see chart for details).  I reviewed extensive medical records and [facility] and care everywhere regarding her prior visits to [facility] and prior admissions at [facility] and [place].  I saw that several days ago at [facility] they tried to admit her for DKA but she signed out AMA.  I reviewed the patient's prescription history over the last 12 months in the [place] Controlled Substances Database, and she has not had any controlled substances filled in the last 6 months since she had foot surgery.  She was very reasonable during my discussion with her.  I sat down and spent at least 15 minutes with her talking about her  symptoms, the contribution that marijuana and cannabinol and hyperemesis syndrome likely placed to her symptoms, and why I do not recommend the use of opioids.  She and her sister who is at her bedside are both very understanding and reasonable and stated the appreciated me taken the time to explain things.  She is hemodynamically stable  except for a very borderline tachycardia but she is tolerating by mouth fluids in the ED.  All of her labs are reassuring.  She has a strep urinary tract infection and because she is having difficulty tolerating the Augmentin and this very well may be contributing to her abdominal discomfort, I will give her a Bicillin shot 2.4 million units which should treat the mild (and asymptomatic ) UTI.  Discussed the use of Bentyl and Zofran and if the Zofran is not able to be tolerated, Phenergan suppositories, and she understands and agrees with the plan.  I looked up drug interactions between Bentyl and olanzapine and I saw that a olanzapine can increase the efficacy of Bentyl so I am recommending the use of 10 mg 4 times a day rather than the usual 20 mg a day.  I made it clear to her that she should continue taking all of her regular medications including her psychiatric medications.  I gave my usual and customary return precautions.  She and her sister understand and agree with the plan and they will follow up with her primary care doctor at the next available opportunity.  She is in no acute distress and ambulatory without difficulty at the time of discharge.  ____________________________________________  FINAL CLINICAL IMPRESSION(S) / ED DIAGNOSES  Final diagnoses:  Cyclical vomiting with nausea, intractability of vomiting not specified  Cannabinoid hyperemesis syndrome (HCC)  Generalized abdominal pain     MEDICATIONS GIVEN DURING THIS VISIT:  Medications  penicillin g procaine-penicillin g benzathine (BICILLIN-CR) injection 600000-600000 units (not administered)    dicyclomine (BENTYL) capsule 10 mg (not administered)     NEW OUTPATIENT MEDICATIONS STARTED DURING THIS VISIT:  New Prescriptions   DICYCLOMINE (BENTYL) 10 MG CAPSULE    Take 1 capsule (10 mg total) by mouth 4 (four) times daily. You may stop taking it after your symptoms improve.   ONDANSETRON (ZOFRAN ODT) 4 MG DISINTEGRATING TABLET    Allow 1-2 tablets to dissolve in your mouth every 8 hours as needed for nausea/vomiting   PROMETHAZINE (PHENERGAN) 25 MG SUPPOSITORY    Place 1 suppository (25 mg total) rectally every 6 (six) hours as needed for nausea.    Modified Medications   No medications on file    Discontinued Medications   ONDANSETRON (ZOFRAN ODT) 4 MG DISINTEGRATING TABLET    Take 1 tablet (4 mg total) by mouth every 8 (eight) hours as needed for nausea or vomiting.     Note:  This document was prepared using Dragon voice recognition software and may include unintentional dictation errors.    Loleta Rose, MD 08/09/16 [phone]

## 2016-08-09 NOTE — Discharge Instructions (Signed)
You have been seen in the Emergency Department (ED) for abdominal pain.  Your evaluation did not identify a clear cause of your symptoms but was generally reassuring.  All of your labs look considerably better [facility].  As we discussed, we believe that given her use of marijuana is either contributing to or causing her symptoms.  We recommend that you take the prescribed medications as written and do not use marijuana and see if this improves how you feel; we will leave you will likely feel better in a few days.  This will also give you the opportunity to follow up with your primary care doctor.  Please follow up as instructed above regarding today?s emergent visit and the symptoms that are bothering you.  Continue to take your regular medications.  Return to the ED if your abdominal pain worsens or fails to improve, you develop bloody vomiting, bloody diarrhea, you are unable to tolerate fluids due to vomiting, fever greater than 101, or other symptoms that concern you.

## 2016-08-09 NOTE — ED Triage Notes (Signed)
Patient to ER for c/o generalized abdominal pain. States she has been diagnosed with gastroparesis, but no one will give her medicine to help with pain. Patient also states "My doctor told my ketoacidosis may be harming me". States she has not checked her sugar recently, but just drank a whole coke before coming to triage room. Patient texting on phone during triage. Also states she has been diagnosed recently with UTI, but has been unable to keep down antibiotics.

## 2016-08-11 LAB — URINE CULTURE
Culture: NO GROWTH
Special Requests: NORMAL

## 2017-04-10 IMAGING — CR DG FOOT COMPLETE 3+V*R*
1 series · 3 of 3 positions shown · non-contrast
Comparison: 04/15/2015.

CLINICAL DATA: 34-year-old female with history of diabetic ulcer
for the past 2 weeks on the right great toe.

EXAM:
RIGHT FOOT COMPLETE - 3+ VIEW

[Series 1: ap · 0.17mm/px · 3 of 3 slices shown]
[im 1/3]
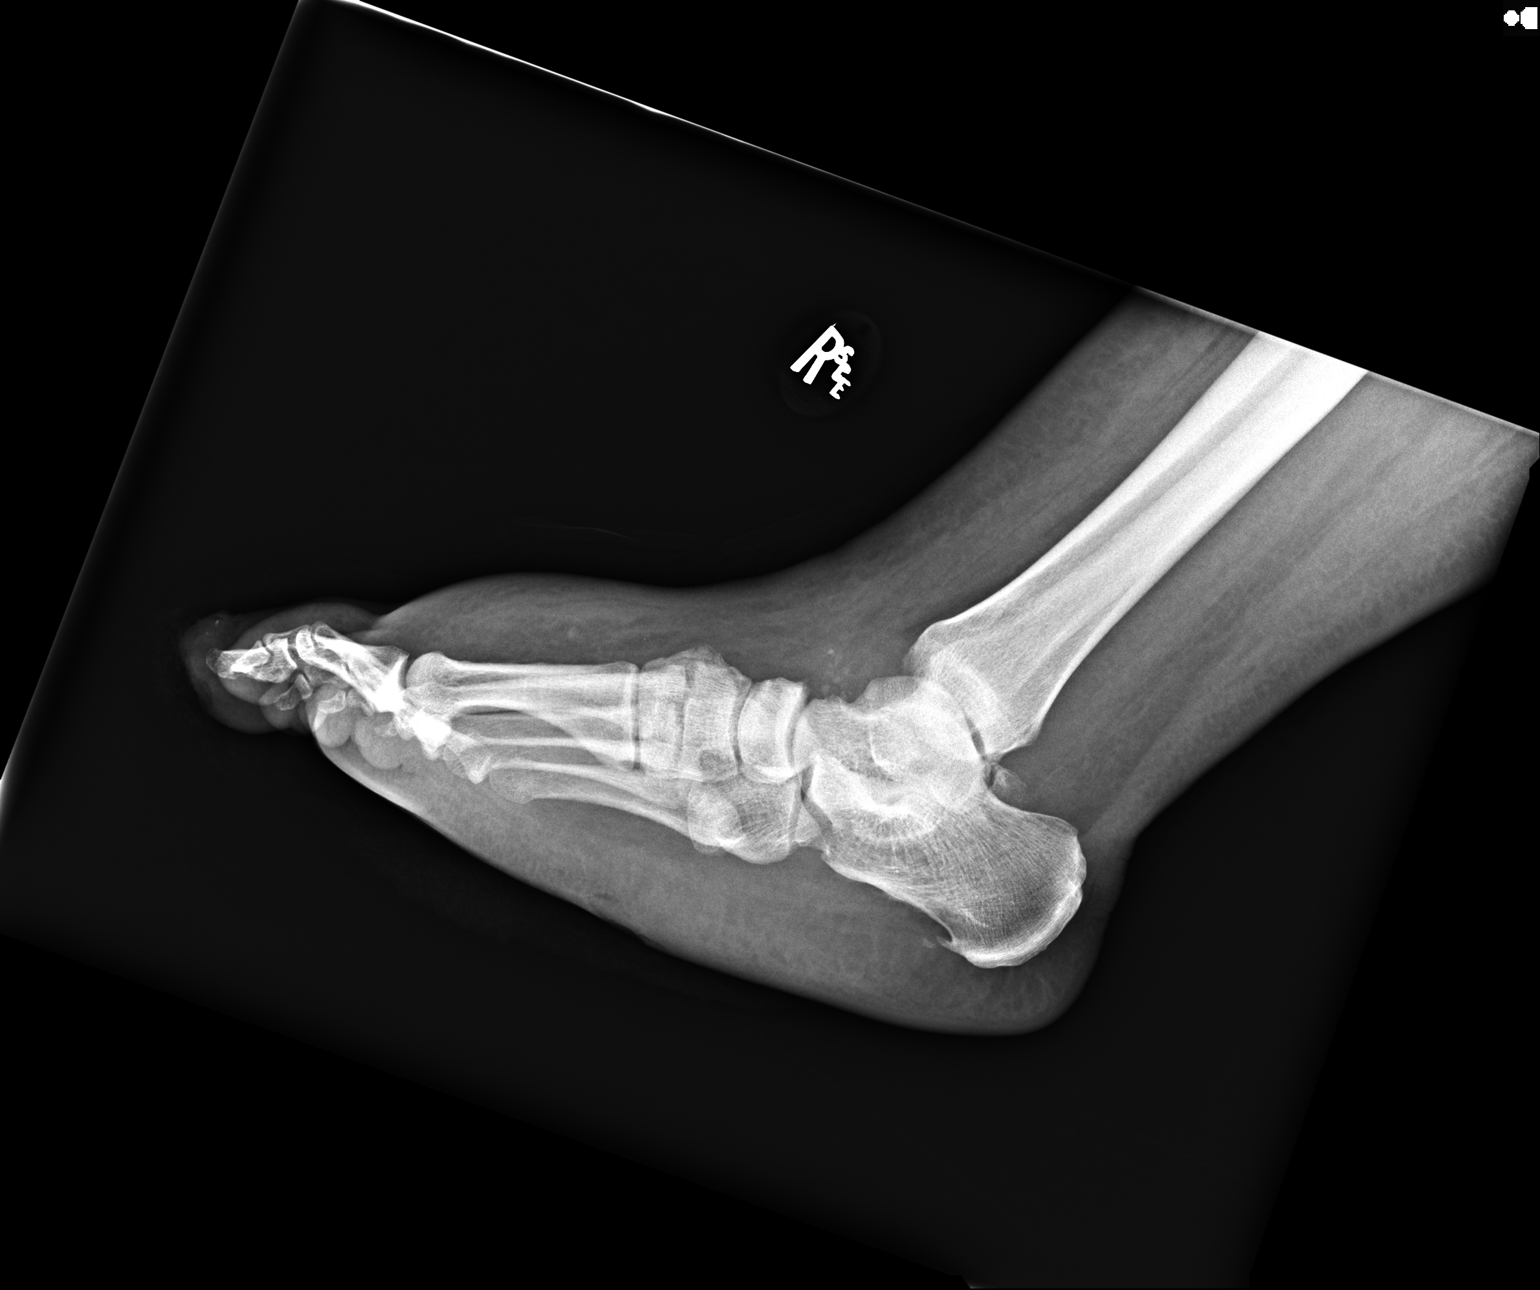
[im 2/3]
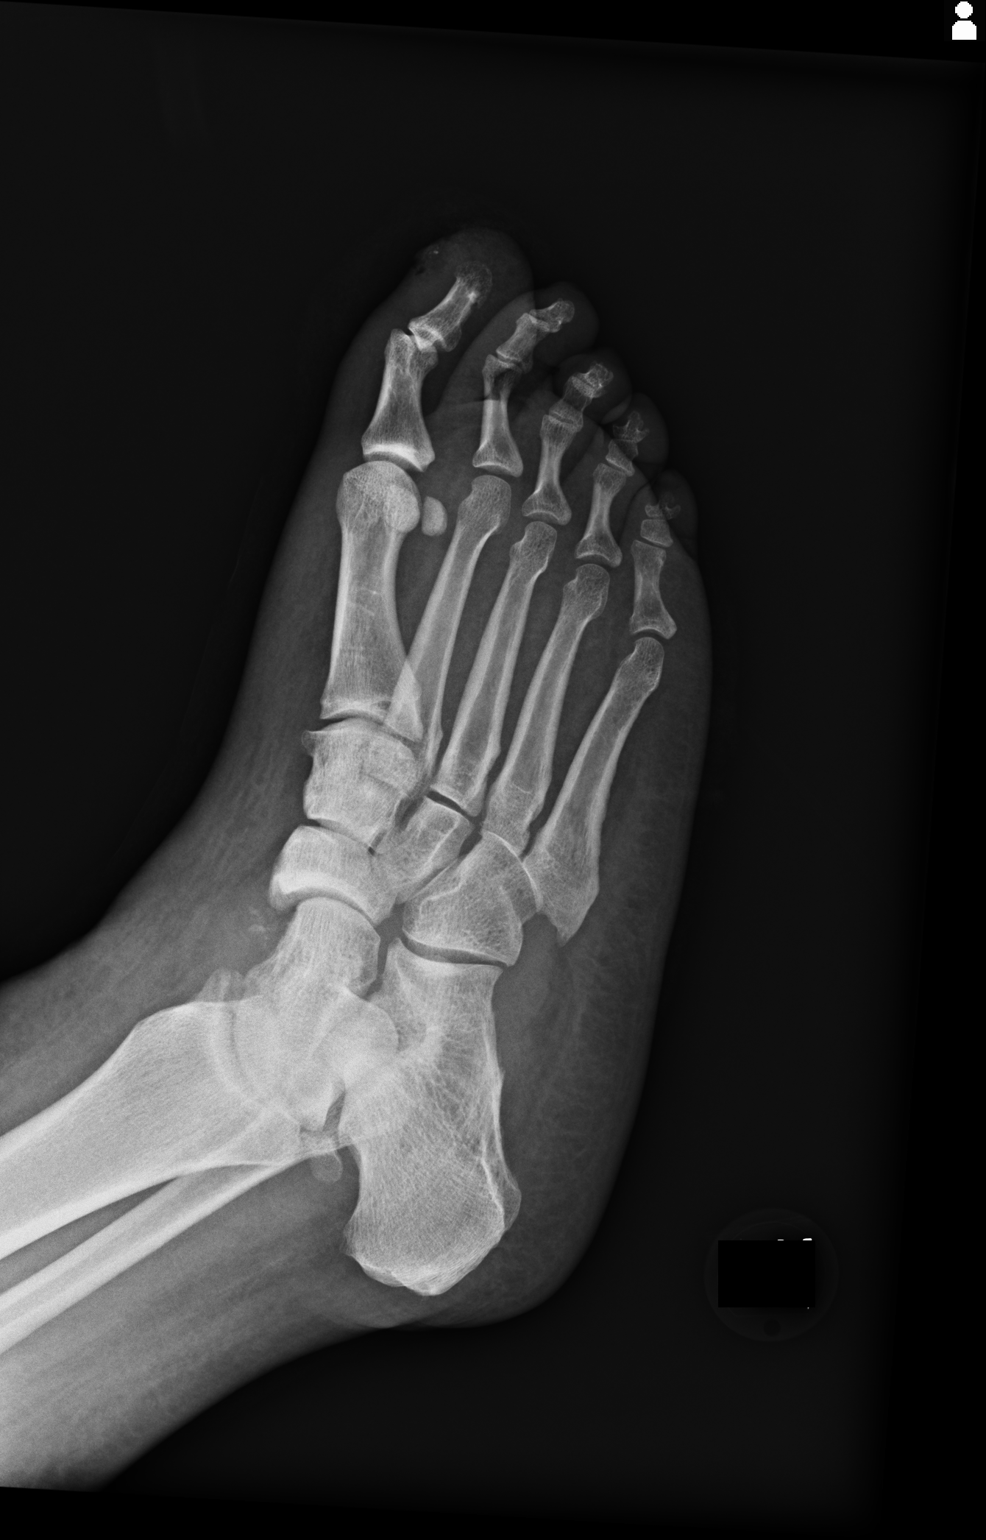
[im 3/3]
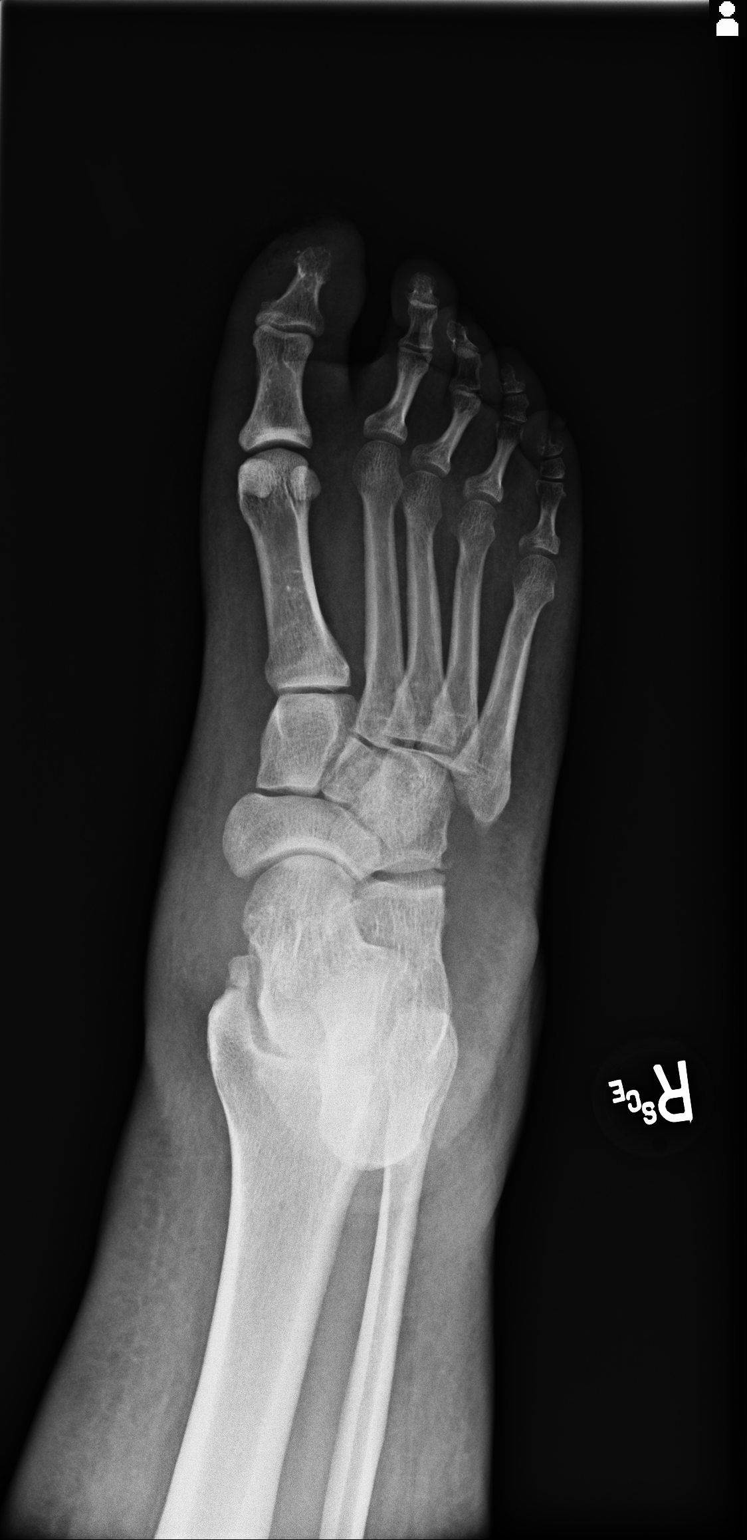

[3 of 3 positions shown; findings below may reference images not displayed]

FINDINGS: Soft tissue irregularity at the medial aspect of the tip of the
great toe again noted, compatible with the reported ulcer. There is
some high density material within the soft tissues, presumably
debris. The underlying bone appears grossly intact, without definite
destructive lesion to strongly suggest underlying osteomyelitis. No
acute displaced fracture, subluxation or dislocation.
IMPRESSION: 1. Soft tissue irregularity in the distal aspect of the right great
toe, compatible with the reported diabetic ulcer. No underlying bony
abnormality to strongly suggest osteomyelitis at this time.

## 2017-08-27 ENCOUNTER — Encounter: Payer: Self-pay | Admitting: *Deleted

## 2017-08-27 ENCOUNTER — Ambulatory Visit
Admission: EM | Admit: 2017-08-27 | Discharge: 2017-08-27 | Disposition: A | Discharge status: Home / Self Care | Payer: Medicare Other | Attending: Family Medicine | Admitting: Family Medicine

## 2017-08-27 DIAGNOSIS — J029 Acute pharyngitis, unspecified: Secondary | ICD-10-CM | POA: Insufficient documentation

## 2017-08-27 DIAGNOSIS — Z87891 Personal history of nicotine dependence: Secondary | ICD-10-CM | POA: Diagnosis not present

## 2017-08-27 DIAGNOSIS — Z794 Long term (current) use of insulin: Secondary | ICD-10-CM | POA: Insufficient documentation

## 2017-08-27 DIAGNOSIS — F319 Bipolar disorder, unspecified: Secondary | ICD-10-CM | POA: Insufficient documentation

## 2017-08-27 DIAGNOSIS — E119 Type 2 diabetes mellitus without complications: Secondary | ICD-10-CM | POA: Insufficient documentation

## 2017-08-27 DIAGNOSIS — R05 Cough: Secondary | ICD-10-CM

## 2017-08-27 DIAGNOSIS — J069 Acute upper respiratory infection, unspecified: Secondary | ICD-10-CM

## 2017-08-27 LAB — RAPID STREP SCREEN (MED CTR MEBANE ONLY): STREPTOCOCCUS, GROUP A SCREEN (DIRECT): NEGATIVE

## 2017-08-27 NOTE — ED Triage Notes (Signed)
Sore throat, cough, and head congestion x2 days.

## 2017-08-27 NOTE — ED Provider Notes (Signed)
[facility]    CSN: [id number] Arrival date & time: 08/27/17  0949     History   Chief Complaint Chief Complaint  Patient presents with  . Sore Throat  . Cough    HPI Jacky Kindleatasha R Lyon is a 37 y.o. female.   HPI  37 year old diabetic female presents with a 2-day history of sore throat cough and head congestion.  Cough has been nonproductive.  She has had mucus discharge from her nose.  There is no shortness of breath.  She is afebrile has no symptoms of fever or chills.  O2 sats 100% on room air.          Past Medical History:  Diagnosis Date  . Addison disease (HCC) 2011  . Bipolar 1 disorder (HCC) 2011  . Diabetes mellitus without complication (HCC) 1998  . Gastroparesis   . Ketoacidosis   . Marijuana abuse   . PONV (postoperative nausea and vomiting)     Patient Active Problem List   Diagnosis Date Noted  . Pressure ulcer 04/16/2015  . Cellulitis 04/15/2015  . DKA (diabetic ketoacidoses) (HCC) 03/15/2015  . UTI (lower urinary tract infection) 03/15/2015  . Gastroparesis 03/15/2015    Past Surgical History:  Procedure Laterality Date  . ABCESS DRAINAGE N/A    Buttocks 2011, right groin 2013  . AMPUTATION TOE Right 06/24/2015   Procedure: AMPUTATION TOE;  Surgeon: Gwyneth RevelsJustin Fowler, DPM;  Location: [facility] ORS;  Service: Podiatry;  Laterality: Right;    OB History    No data available       Home Medications    Prior to Admission medications   Medication Sig Start Date End Date Taking? Authorizing Provider  insulin NPH Human (HUMULIN N,NOVOLIN N) 100 UNIT/ML injection Inject 25-27 Units into the skin 2 (two) times daily. Pt uses 27 units in the morning and 25 units in the evening.   Yes [provider]  insulin regular (NOVOLIN R,HUMULIN R) 100 units/mL injection Inject 6 Units into the skin 3 (three) times daily before meals. Unit dosage is 2, 4, 6 depending on number of carbs or 8 if BG is over 200. Pt is on an insulin pump.   Yes  [provider]  acetaminophen (TYLENOL) 325 MG tablet Take 2 tablets (650 mg total) by mouth every 6 (six) hours as needed for mild pain (or Fever >/= 101). 04/18/15   Gouru, Aruna, MD  furosemide (LASIX) 40 MG tablet Take 40 mg by mouth every morning.     [provider]  promethazine (PHENERGAN) 25 MG suppository Place 1 suppository (25 mg total) rectally every 6 (six) hours as needed for nausea. 08/09/16 08/09/17  Loleta RoseForbach, Cory, MD    Family History Family History  Problem Relation Age of Onset  . Diabetes Mother   . Stroke Mother   . Healthy Father     Social History Social History   Tobacco Use  . Smoking status: Former Smoker    Packs/day: 0.25    Types: Cigarettes  . Smokeless tobacco: Never Used  Substance Use Topics  . Alcohol use: No    Alcohol/week: 0.0 oz    Comment: 1 glasas of wine twice a year  . Drug use: Yes    Types: Marijuana    Comment: 3-4 joints 4-5 days a week     Allergies   Patient has no known allergies.   Review of Systems Review of Systems  Constitutional: Positive for activity change. Negative for chills, fatigue and fever.  HENT: Positive for congestion, postnasal drip, sinus pressure and sore throat.   Respiratory: Positive for cough. Negative for shortness of breath, wheezing and stridor.   All other systems reviewed and are negative.    Physical Exam Triage Vital Signs ED Triage Vitals  Enc Vitals Group     BP 08/27/17 1000 (!) 164/88     Pulse Rate 08/27/17 1000 94     Resp 08/27/17 1000 16     Temp 08/27/17 1000 98.2 F (36.8 C)     Temp Source 08/27/17 1000 Oral     SpO2 08/27/17 1000 100 %     Weight 08/27/17 1002 294 lb (133.4 kg)     Height 08/27/17 1002 5\' 7"  (1.702 m)     Head Circumference --      Peak Flow --      Pain Score --      Pain Loc --      Pain Edu? --      Excl. in GC? --    No data found.  Updated Vital Signs BP (!) 164/88 (BP Location: Left Arm)   Pulse 94   Temp 98.2 F  (36.8 C) (Oral)   Resp 16   Ht 5\' 7"  (1.702 m)   Wt 294 lb (133.4 kg)   SpO2 100%   BMI 46.05 kg/m   Visual Acuity Right Eye Distance:   Left Eye Distance:   Bilateral Distance:    Right Eye Near:   Left Eye Near:    Bilateral Near:     Physical Exam  Constitutional: She is oriented to person, place, and time. She appears well-developed and well-nourished.  Non-toxic appearance. She does not appear ill. No distress.  HENT:  Head: Normocephalic.  Right Ear: Hearing, tympanic membrane and ear canal normal.  Left Ear: Hearing, tympanic membrane and ear canal normal.  Mouth/Throat: Oropharynx is clear and moist and mucous membranes are normal. Tonsils are 1+ on the right. Tonsils are 1+ on the left.  Patient has scattered and several tonsil stones bilateral.  Eyes: Pupils are equal, round, and reactive to light.  Neck: Normal range of motion.  Pulmonary/Chest: Effort normal and breath sounds normal.  Abdominal: Soft. Bowel sounds are normal.  Lymphadenopathy:    She has cervical adenopathy.  Neurological: She is alert and oriented to person, place, and time.  Skin: Skin is warm and dry.  Psychiatric: She has a normal mood and affect. Her behavior is normal.  Nursing note and vitals reviewed.    UC Treatments / Results  Labs (all labs ordered are listed, but only abnormal results are displayed) Labs Reviewed  RAPID STREP SCREEN (NOT AT [facility])  CULTURE, GROUP A STREP [facility])    EKG  EKG Interpretation None       Radiology No results found.  Procedures Procedures (including critical care time)  Medications Ordered in UC Medications - No data to display   Initial Impression / Assessment and Plan / UC Course  I have reviewed the triage vital signs and the nursing notes.  Pertinent labs & imaging results that were available during my care of the patient were reviewed by me and considered in my medical decision making (see chart for details).     Plan: 1.  Test/x-ray results and diagnosis reviewed with patient 2. rx as per orders; risks, benefits, potential side effects reviewed with patient 3. Recommend supportive treatment with rest and fluids.  Recommend Motrin for throat pain along with salt water gargles  and/or lozenges.  Patient was reassured that this is likely a viral illness does not require antibiotics.  However her throat cultures will be available in 48 hours. 4. F/u prn if symptoms worsen or don't improve   Final Clinical Impressions(s) / UC Diagnoses   Final diagnoses:  Upper respiratory tract infection, unspecified type    ED Discharge Orders    None       Controlled Substance Prescriptions [place] Controlled Substance Registry consulted? Not Applicable   Lutricia Feil, PA-C 08/27/17 1116

## 2017-08-30 LAB — CULTURE, GROUP A STREP (THRC)

## 2017-08-31 ENCOUNTER — Telehealth: Payer: Self-pay | Admitting: Emergency Medicine

## 2017-08-31 MED ORDER — AMOXICILLIN 500 MG PO CAPS: 500.0000 mg | ORAL_CAPSULE | Freq: Two times a day (BID) | ORAL | 0 refills | Status: AC

## 2017-08-31 NOTE — Telephone Encounter (Signed)
Patient notified that her throat culture grew few strep bacteria.  Patient states that her symptoms have not improve and is still having a sore throat.  Dr. Adriana Simasook would like Amoxicillin 500mg  1 tablet twice a day for 10 days be sent to her pharmacy.  Prescription was sent to [facility] in [place].  Patient notified the prescription was sent to her pharmacy.  Patient to take antibiotic as directed and follow-up here or with PCP if symptoms persist or worsen.  Patient verbalized understanding.

## 2018-09-15 ENCOUNTER — Encounter: Payer: Self-pay | Admitting: Emergency Medicine

## 2018-09-15 ENCOUNTER — Ambulatory Visit
Admission: EM | Admit: 2018-09-15 | Discharge: 2018-09-15 | Disposition: A | Discharge status: Home / Self Care | Payer: Medicare Other | Attending: Family Medicine | Admitting: Family Medicine

## 2018-09-15 ENCOUNTER — Other Ambulatory Visit: Payer: Self-pay

## 2018-09-15 DIAGNOSIS — J020 Streptococcal pharyngitis: Secondary | ICD-10-CM | POA: Diagnosis present

## 2018-09-15 LAB — RAPID STREP SCREEN (MED CTR MEBANE ONLY): Streptococcus, Group A Screen (Direct): POSITIVE — AB

## 2018-09-15 MED ORDER — PENICILLIN V POTASSIUM 500 MG PO TABS: 500.0000 mg | ORAL_TABLET | Freq: Three times a day (TID) | ORAL | 0 refills | Status: DC

## 2018-09-15 NOTE — Discharge Instructions (Signed)
Salt water gargles, fluids, tylenol/advil

## 2018-09-15 NOTE — ED Triage Notes (Signed)
Patient c/o sore throat, cough and nasal congestion that started 5 days ago. She denies fever. She states she had strep throat recently and this feels the same.

## 2018-09-15 NOTE — ED Provider Notes (Signed)
[facility]    CSN: [id number] Arrival date & time: 09/15/18  1103     History   Chief Complaint Chief Complaint  Patient presents with  . Sore Throat  . Cough    HPI Laura Escobar is a 38 y.o. female.   The history is provided by the patient.  Sore Throat   Cough  Associated symptoms: sore throat   URI  Presenting symptoms: congestion, cough and sore throat   Severity:  Moderate Onset quality:  Sudden Duration:  5 days Timing:  Constant Progression:  Unchanged Chronicity:  New Relieved by:  Nothing Ineffective treatments:  OTC medications Risk factors: sick contacts     Past Medical History:  Diagnosis Date  . Addison disease (HCC) 2011  . Bipolar 1 disorder (HCC) 2011  . Diabetes mellitus without complication (HCC) 1998  . Gastroparesis   . Ketoacidosis   . Marijuana abuse   . PONV (postoperative nausea and vomiting)     Patient Active Problem List   Diagnosis Date Noted  . Pressure ulcer 04/16/2015  . Cellulitis 04/15/2015  . DKA (diabetic ketoacidoses) (HCC) 03/15/2015  . UTI (lower urinary tract infection) 03/15/2015  . Gastroparesis 03/15/2015    Past Surgical History:  Procedure Laterality Date  . ABCESS DRAINAGE N/A    Buttocks 2011, right groin 2013  . AMPUTATION TOE Right 06/24/2015   Procedure: AMPUTATION TOE;  Surgeon: Gwyneth RevelsJustin Fowler, DPM;  Location: [facility] OR[facility];  Service: Podiatry;  Laterality: Right;    OB History   No obstetric history on file.      Home Medications    Prior to Admission medications   Medication Sig Start Date End Date Taking? Authorizing Provider  furosemide (LASIX) 40 MG tablet Take 40 mg by mouth every morning.    Yes [provider]  insulin NPH Human (HUMULIN N,NOVOLIN N) 100 UNIT/ML injection Inject 25-27 Units into the skin 2 (two) times daily. Pt uses 27 units in the morning and 25 units in the evening.   Yes [provider]  insulin regular (NOVOLIN R,HUMULIN R) 100  units/mL injection Inject 6 Units into the skin 3 (three) times daily before meals. Unit dosage is 2, 4, 6 depending on number of carbs or 8 if BG is over 200. Pt is on an insulin pump.   Yes [provider]  acetaminophen (TYLENOL) 325 MG tablet Take 2 tablets (650 mg total) by mouth every 6 (six) hours as needed for mild pain (or Fever >/= 101). 04/18/15   Gouru, Aruna, MD  penicillin v potassium (VEETID) 500 MG tablet Take 1 tablet (500 mg total) by mouth 3 (three) times daily. 09/15/18   Payton Mccallumonty, Young Brim, MD  promethazine (PHENERGAN) 25 MG suppository Place 1 suppository (25 mg total) rectally every 6 (six) hours as needed for nausea. 08/09/16 08/09/17  Loleta RoseForbach, Cory, MD    Family History Family History  Problem Relation Age of Onset  . Diabetes Mother   . Stroke Mother   . Healthy Father     Social History Social History   Tobacco Use  . Smoking status: Former Smoker    Packs/day: 0.25    Types: Cigarettes  . Smokeless tobacco: Never Used  Substance Use Topics  . Alcohol use: No    Alcohol/week: 0.0 standard drinks    Comment: 1 glasas of wine twice a year  . Drug use: Yes    Types: Marijuana    Comment: 3-4 joints 4-5 days a week  Allergies   Patient has no known allergies.   Review of Systems Review of Systems  HENT: Positive for congestion and sore throat.   Respiratory: Positive for cough.      Physical Exam Triage Vital Signs ED Triage Vitals  Enc Vitals Group     BP 09/15/18 1147 127/84     Pulse Rate 09/15/18 1147 85     Resp 09/15/18 1147 18     Temp 09/15/18 1147 98.2 F (36.8 C)     Temp Source 09/15/18 1147 Oral     SpO2 09/15/18 1147 100 %     Weight 09/15/18 1145 278 lb (126.1 kg)     Height 09/15/18 1145 5\' 7"  (1.702 m)     Head Circumference --      Peak Flow --      Pain Score 09/15/18 1145 9     Pain Loc --      Pain Edu? --      Excl. in GC? --    No data found.  Updated Vital Signs BP 127/84 (BP Location: Right Arm)    Pulse 85   Temp 98.2 F (36.8 C) (Oral)   Resp 18   Ht 5\' 7"  (1.702 m)   Wt 126.1 kg   LMP 08/08/2016 (Exact Date)   SpO2 100%   BMI 43.54 kg/m   Visual Acuity Right Eye Distance:   Left Eye Distance:   Bilateral Distance:    Right Eye Near:   Left Eye Near:    Bilateral Near:     Physical Exam Vitals signs and nursing note reviewed.  Constitutional:      General: She is not in acute distress.    Appearance: She is well-developed. She is not toxic-appearing or diaphoretic.  HENT:     Head: Normocephalic and atraumatic.     Mouth/Throat:     Pharynx: Uvula midline. Posterior oropharyngeal erythema present. No oropharyngeal exudate.     Tonsils: No tonsillar exudate or tonsillar abscesses.  Eyes:     General: No scleral icterus.       Right eye: No discharge.        Left eye: No discharge.  Neck:     Musculoskeletal: Normal range of motion and neck supple.     Thyroid: No thyromegaly.  Cardiovascular:     Rate and Rhythm: Normal rate and regular rhythm.     Heart sounds: Normal heart sounds.  Pulmonary:     Effort: Pulmonary effort is normal. No respiratory distress.     Breath sounds: Normal breath sounds. No stridor. No wheezing, rhonchi or rales.  Lymphadenopathy:     Cervical: No cervical adenopathy.  Neurological:     Mental Status: She is alert.      UC Treatments / Results  Labs (all labs ordered are listed, but only abnormal results are displayed) Labs Reviewed  RAPID STREP SCREEN ([facility] ONLY) - Abnormal; Notable for the following components:      Result Value   Streptococcus, Group A Screen (Direct) POSITIVE (*)    All other components within normal limits    EKG None  Radiology No results found.  Procedures Procedures (including critical care time)  Medications Ordered in UC Medications - No data to display  Initial Impression / Assessment and Plan / UC Course  I have reviewed the triage vital signs and the nursing  notes.  Pertinent labs & imaging results that were available during my care of the patient were  reviewed by me and considered in my medical decision making (see chart for details).      Final Clinical Impressions(s) / UC Diagnoses   Final diagnoses:  Strep pharyngitis     Discharge Instructions     Salt water gargles, fluids, tylenol/advil    ED Prescriptions    Medication Sig Dispense Auth. Provider   penicillin v potassium (VEETID) 500 MG tablet Take 1 tablet (500 mg total) by mouth 3 (three) times daily. 30 tablet Payton Mccallumonty, Aj Crunkleton, MD     1. Lab results and diagnosis reviewed with patient 2. rx as per orders above; reviewed possible side effects, interactions, risks and benefits  3. Recommend supportive treatment as above 4. Follow-up prn if symptoms worsen or don't improve    Controlled Substance Prescriptions [place] Controlled Substance Registry consulted? Not Applicable   Payton Mccallumonty, Terrel Manalo, MD 09/15/18 1214

## 2018-12-15 ENCOUNTER — Encounter: Payer: Self-pay | Admitting: Emergency Medicine

## 2018-12-15 ENCOUNTER — Ambulatory Visit
Admission: EM | Admit: 2018-12-15 | Discharge: 2018-12-15 | Disposition: A | Discharge status: Home / Self Care | Payer: Medicare Other | Attending: Family Medicine | Admitting: Family Medicine

## 2018-12-15 ENCOUNTER — Other Ambulatory Visit: Payer: Self-pay

## 2018-12-15 DIAGNOSIS — H6691 Otitis media, unspecified, right ear: Secondary | ICD-10-CM

## 2018-12-15 DIAGNOSIS — J029 Acute pharyngitis, unspecified: Secondary | ICD-10-CM

## 2018-12-15 LAB — RAPID STREP SCREEN (MED CTR MEBANE ONLY): Streptococcus, Group A Screen (Direct): NEGATIVE

## 2018-12-15 MED ORDER — AMOXICILLIN 875 MG PO TABS: 875.0000 mg | ORAL_TABLET | Freq: Two times a day (BID) | ORAL | 0 refills | Status: DC

## 2018-12-15 MED ORDER — FLUCONAZOLE 150 MG PO TABS: 150.0000 mg | ORAL_TABLET | Freq: Every day | ORAL | 0 refills | Status: DC

## 2018-12-15 NOTE — ED Provider Notes (Signed)
[facility] ____________________________________________  Time seen: Approximately 3:50 PM  I have reviewed the triage vital signs and the nursing notes.   HISTORY  Chief Complaint Sore Throat and Otalgia   HPI Laura Escobar is a 38 y.o. female presenting for evaluation of approximately 1 week of sore throat and ear discomfort.  States sore throat is mostly with swallowing and feels like her glands are swollen.  States discomfort goes up towards her right ear.  States has had some nasal congestion as well consistent with her seasonal allergies, which she takes Benadryl for.  Denies fevers, chills or cough.  Denies chest pain or shortness of breath.  Continues to eat and drink well.  Denies known sick contacts or home sick contacts.  Has intermittently taken over-the-counter Goody's powders which help some.  Denies other aggravating or alleviating factors.  Denies recent anabolic use.  Reports otherwise doing well.   Past Medical History:  Diagnosis Date  . Addison disease (HCC) 2011  . Bipolar 1 disorder (HCC) 2011  . Diabetes mellitus without complication (HCC) 1998  . Gastroparesis   . Ketoacidosis   . Marijuana abuse   . PONV (postoperative nausea and vomiting)     Patient Active Problem List   Diagnosis Date Noted  . Pressure ulcer 04/16/2015  . Cellulitis 04/15/2015  . DKA (diabetic ketoacidoses) (HCC) 03/15/2015  . UTI (lower urinary tract infection) 03/15/2015  . Gastroparesis 03/15/2015    Past Surgical History:  Procedure Laterality Date  . ABCESS DRAINAGE N/A    Buttocks 2011, right groin 2013  . AMPUTATION TOE Right 06/24/2015   Procedure: AMPUTATION TOE;  Surgeon: Gwyneth Revels, DPM;  Location: [facility];  Service: Podiatry;  Laterality: Right;     No current facility-administered medications for this encounter.   Current Outpatient Medications:  .  acetaminophen (TYLENOL) 325 MG tablet, Take 2 tablets (650 mg total) by mouth every 6 (six)  hours as needed for mild pain (or Fever >/= 101)., Disp: , Rfl:  .  furosemide (LASIX) 40 MG tablet, Take 40 mg by mouth every morning. , Disp: , Rfl:  .  insulin NPH Human (HUMULIN N,NOVOLIN N) 100 UNIT/ML injection, Inject 25-27 Units into the skin 2 (two) times daily. Pt uses 27 units in the morning and 25 units in the evening., Disp: , Rfl:  .  insulin regular (NOVOLIN R,HUMULIN R) 100 units/mL injection, Inject 6 Units into the skin 3 (three) times daily before meals. Unit dosage is 2, 4, 6 depending on number of carbs or 8 if BG is over 200. Pt is on an insulin pump., Disp: , Rfl:  .  amoxicillin (AMOXIL) 875 MG tablet, Take 1 tablet (875 mg total) by mouth 2 (two) times daily., Disp: 20 tablet, Rfl: 0 .  fluconazole (DIFLUCAN) 150 MG tablet, Take 1 tablet (150 mg total) by mouth daily. Take one pill orally, as needed at end of antibiotic, Disp: 1 tablet, Rfl: 0 .  penicillin v potassium (VEETID) 500 MG tablet, Take 1 tablet (500 mg total) by mouth 3 (three) times daily., Disp: 30 tablet, Rfl: 0 .  promethazine (PHENERGAN) 25 MG suppository, Place 1 suppository (25 mg total) rectally every 6 (six) hours as needed for nausea., Disp: 30 suppository, Rfl: 1  Allergies Metformin and related  Family History  Problem Relation Age of Onset  . Diabetes Mother   . Stroke Mother   . Healthy Father     Social History Social History   Tobacco Use  .  Smoking status: Former Smoker    Packs/day: 0.25    Types: Cigarettes  . Smokeless tobacco: Never Used  Substance Use Topics  . Alcohol use: No    Alcohol/week: 0.0 standard drinks    Comment: 1 glasas of wine twice a year  . Drug use: Yes    Types: Marijuana    Comment: 3-4 joints 4-5 days a week    Review of Systems Constitutional: No fever ENT: As above.  Cardiovascular: Denies chest pain. Respiratory: Denies shortness of breath. Gastrointestinal: No abdominal pain.  Musculoskeletal: Negative for back pain. Skin: Negative for rash.    ____________________________________________   PHYSICAL EXAM:  VITAL SIGNS: ED Triage Vitals  Enc Vitals Group     BP 12/15/18 1455 101/65     Pulse Rate 12/15/18 1455 89     Resp 12/15/18 1455 18     Temp 12/15/18 1455 97.6 F (36.4 C)     Temp Source 12/15/18 1455 Oral     SpO2 12/15/18 1455 100 %     Weight 12/15/18 1453 267 lb (121.1 kg)     Height 12/15/18 1453 5\' 7"  (1.702 m)     Head Circumference --      Peak Flow --      Pain Score 12/15/18 1453 10     Pain Loc --      Pain Edu? --      Excl. in GC? --     Constitutional: Alert and oriented. Well appearing and in no acute distress. Eyes: Conjunctivae are normal.  Head: Atraumatic. No sinus tenderness to palpation. No swelling. No erythema.  Ears: Left: nontender, cerumen present in canal, no erythema, normal TM.                       Right: Nontender, normal canal, moderate erythema and dull TM.  Nose:No nasal congestion  Mouth/Throat: Mucous membranes are moist. Mild pharyngeal erythema. No tonsillar swelling or exudate.  Mild right upper gumline erythema and tenderness, no abscess visualized or palpated. Neck: No stridor.  No cervical spine tenderness to palpation. Hematological/Lymphatic/Immunilogical: Mild anterior bilateral cervical lymphadenopathy. Cardiovascular: Normal rate, regular rhythm. Grossly normal heart sounds.  Good peripheral circulation. Respiratory: Normal respiratory effort.  No retractions. No wheezes, rales or rhonchi. Good air movement.  Neurologic:  Normal speech and language. No gait instability. Skin:  Skin appears warm, dry and intact. No rash noted. Psychiatric: Mood and affect are normal. Speech and behavior are normal.  ___________________________________________   LABS (all labs ordered are listed, but only abnormal results are displayed)  Labs Reviewed  RAPID STREP SCREEN ([facility] ONLY)  CULTURE, GROUP A STREP [facility])    PROCEDURES Procedures   INITIAL IMPRESSION  / ASSESSMENT AND PLAN / ED COURSE  Pertinent labs & imaging results that were available during my care of the patient were reviewed by me and considered in my medical decision making (see chart for details).  Well-appearing patient.  No acute distress.  Strep negative, will culture.  Right otitis media noted, also concern for right upper gum infection.  Will treat with oral amoxicillin.  Encourage rest, fluids, supportive care and close monitoring.  Also request Rx Diflucan, Rx given.  Discussed indication, risks and benefits of medications with patient.  Discussed follow up with Primary care physician this week. Discussed follow up and return parameters including no resolution or any worsening concerns. Patient verbalized understanding and agreed to plan.   ____________________________________________   FINAL CLINICAL  IMPRESSION(S) / ED DIAGNOSES  Final diagnoses:  Right otitis media, unspecified otitis media type  Pharyngitis, unspecified etiology     ED Discharge Orders         Ordered    amoxicillin (AMOXIL) 875 MG tablet  2 times daily     12/15/18 1533    fluconazole (DIFLUCAN) 150 MG tablet  Daily     12/15/18 1538           Note: This dictation was prepared with Dragon dictation along with smaller phrase technology. Any transcriptional errors that result from this process are unintentional.         Renford Dills, NP 12/15/18 1555

## 2018-12-15 NOTE — Discharge Instructions (Addendum)
Take medication as prescribed. Rest. Drink plenty of fluids.  ° °Follow up with your primary care physician this week as needed. Return to Urgent care for new or worsening concerns.  ° °

## 2018-12-15 NOTE — ED Triage Notes (Signed)
Patient c/o sore throat that started on Sunday. She also c/o right side ear pain. Patient has been taking OTC Goody powder for her symptoms.

## 2018-12-17 LAB — CULTURE, GROUP A STREP (THRC)

## 2019-02-04 ENCOUNTER — Other Ambulatory Visit: Payer: Self-pay

## 2019-02-04 ENCOUNTER — Ambulatory Visit
Admission: EM | Admit: 2019-02-04 | Discharge: 2019-02-04 | Disposition: A | Discharge status: Home / Self Care | Payer: Medicare Other | Attending: Emergency Medicine | Admitting: Emergency Medicine

## 2019-02-04 ENCOUNTER — Encounter: Payer: Self-pay | Admitting: Emergency Medicine

## 2019-02-04 DIAGNOSIS — L0291 Cutaneous abscess, unspecified: Secondary | ICD-10-CM | POA: Diagnosis not present

## 2019-02-04 MED ORDER — ACETAMINOPHEN 500 MG PO TABS
1000.0000 mg | ORAL_TABLET | Freq: Once | ORAL | Status: AC
  Administered 2019-02-04: 1000 mg via ORAL

## 2019-02-04 NOTE — ED Triage Notes (Signed)
Pt c/o abscess in her right groin area.  Started about 10 days ago. She states that it has busted and has an odor. Denies fever, but has had chills.

## 2019-02-04 NOTE — Discharge Instructions (Signed)
Head straight to ER.

## 2019-02-04 NOTE — ED Provider Notes (Signed)
[facility]    CSN: [id number] Arrival date & time: 02/04/19  1539      History   Chief Complaint Chief Complaint  Patient presents with  . Abscess    HPI Laura Escobar is a 38 y.o. female presenting with abscess to groin. Pt stated she first noticed it last Sunday (10 days) ago. She used home remedies such as "Boil Away" and self drainage. Due to the growth of the boil, pt here to for complete drainage. Pt has a hx of MRSA; last abscess that required drainage was about 6 years ago. No recent antibiotics.   Past Medical History:  Diagnosis Date  . Addison disease ([place]) 2011  . Bipolar 1 disorder ([place]) 2011  . Diabetes mellitus without complication ([place]) 8657  . Gastroparesis   . Ketoacidosis   . Marijuana abuse   . PONV (postoperative nausea and vomiting)     Patient Active Problem List   Diagnosis Date Noted  . Pressure ulcer 04/16/2015  . Cellulitis 04/15/2015  . DKA (diabetic ketoacidoses) ([place]) 03/15/2015  . UTI (lower urinary tract infection) 03/15/2015  . Gastroparesis 03/15/2015    Past Surgical History:  Procedure Laterality Date  . ABCESS DRAINAGE N/A    Buttocks 2011, right groin 2013  . AMPUTATION TOE Right 06/24/2015   Procedure: AMPUTATION TOE;  Surgeon: Samara Deist, DPM;  Location: [facility] ORS;  Service: Podiatry;  Laterality: Right;    OB History   No obstetric history on file.      Home Medications    Prior to Admission medications   Medication Sig Start Date End Date Taking? Authorizing Provider  acetaminophen (TYLENOL) 325 MG tablet Take 2 tablets (650 mg total) by mouth every 6 (six) hours as needed for mild pain (or Fever >/= 101). 04/18/15  Yes Gouru, Aruna, MD  insulin NPH Human (HUMULIN N,NOVOLIN N) 100 UNIT/ML injection Inject 25-27 Units into the skin 2 (two) times daily. Pt uses 27 units in the morning and 25 units in the evening.   Yes [provider]  insulin regular (NOVOLIN R,HUMULIN R) 100 units/mL  injection Inject 6 Units into the skin 3 (three) times daily before meals. Unit dosage is 2, 4, 6 depending on number of carbs or 8 if BG is over 200. Pt is on an insulin pump.   Yes [provider]  amoxicillin (AMOXIL) 875 MG tablet Take 1 tablet (875 mg total) by mouth 2 (two) times daily. 12/15/18   Marylene Land, NP  fluconazole (DIFLUCAN) 150 MG tablet Take 1 tablet (150 mg total) by mouth daily. Take one pill orally, as needed at end of antibiotic 12/15/18   Marylene Land, NP  furosemide (LASIX) 40 MG tablet Take 40 mg by mouth every morning.     [provider]  penicillin v potassium (VEETID) 500 MG tablet Take 1 tablet (500 mg total) by mouth 3 (three) times daily. 09/15/18   Norval Gable, MD  promethazine (PHENERGAN) 25 MG suppository Place 1 suppository (25 mg total) rectally every 6 (six) hours as needed for nausea. 08/09/16 08/09/17  Hinda Kehr, MD    Family History Family History  Problem Relation Age of Onset  . Diabetes Mother   . Stroke Mother   . Healthy Father     Social History Social History   Tobacco Use  . Smoking status: Former Smoker    Packs/day: 0.25    Types: Cigarettes  . Smokeless tobacco: Never Used  Substance Use Topics  .  Alcohol use: No    Alcohol/week: 0.0 standard drinks    Comment: 1 glasas of wine twice a year  . Drug use: Yes    Types: Marijuana    Comment: 3-4 joints 4-5 days a week     Allergies   Metformin and related   Review of Systems Review of Systems  Constitutional: Negative for chills.  Gastrointestinal: Positive for constipation.       Pt unable to identify last bowel movement  Skin: Positive for wound.  All other systems reviewed and are negative.    Physical Exam Triage Vital Signs ED Triage Vitals  Enc Vitals Group     BP 02/04/19 1611 118/78     Pulse Rate 02/04/19 1611 (!) 116     Resp 02/04/19 1611 20     Temp 02/04/19 1611 (!) 102.4 F (39.1 C)     Temp Source 02/04/19 1611 Oral      SpO2 02/04/19 1611 98 %     Weight 02/04/19 1607 283 lb (128.4 kg)     Height 02/04/19 1607 5' 7.5" (1.715 m)     Head Circumference --      Peak Flow --      Pain Score 02/04/19 1607 10     Pain Loc --      Pain Edu? --      Excl. in GC? --    No data found.  Updated Vital Signs BP 118/78 (BP Location: Left Arm)   Pulse (!) 116   Temp (!) 102.4 F (39.1 C) (Oral)   Resp 20   Ht 5' 7.5" (1.715 m)   Wt 283 lb (128.4 kg)   LMP 08/08/2016 (Exact Date)   SpO2 98%   BMI 43.67 kg/m    Physical Exam  When pt laid supine onto exam table for examination, NP noted a large amount of induration to abdomen/pannus extending from hip to umbilical area. When pannus was lifted, copious amount of stool smelling, purulent drainage expelled without manual pressure. Chux pads used to control drainage. When drainage ceased, NP able to visualize an aprox 1 cm long opening to indurated pannus noted. Opening surrounded with macerated tissue. Area covered with gauze and tape for transport to hospital.   UC Treatments / Results  Labs (all labs ordered are listed, but only abnormal results are displayed) Labs Reviewed - No data to display  EKG None  Radiology No results found.  Procedures Procedures (including critical care time)  Medications Ordered in UC Medications  acetaminophen (TYLENOL) tablet 1,000 mg (1,000 mg Oral Given 02/04/19 1616)    Initial Impression / Assessment and Plan / UC Course  I have reviewed the triage vital signs and the nursing notes.  Pertinent labs & imaging results that were available during my care of the patient were reviewed by me and considered in my medical decision making (see chart for details).   Pt presenting with an abscess. Due to extent of infection, color & smell of drainage, pt sent to hospital via private vehicle for further evaluation.   Final Clinical Impressions(s) / UC Diagnoses   Final diagnoses:  Abscess     Discharge  Instructions     Head straight to ER.   ED Prescriptions    None       Bailey MechBenjamin, Melea Prezioso, NP 02/04/19 1705

## 2019-03-17 ENCOUNTER — Ambulatory Visit: Payer: Self-pay | Admitting: Adult Health

## 2019-03-24 ENCOUNTER — Ambulatory Visit (INDEPENDENT_AMBULATORY_CARE_PROVIDER_SITE_OTHER): Payer: Medicare Other | Admitting: Adult Health

## 2019-03-24 ENCOUNTER — Encounter: Payer: Self-pay | Admitting: Adult Health

## 2019-03-24 ENCOUNTER — Other Ambulatory Visit: Payer: Self-pay

## 2019-03-24 VITALS — BP 146/84 | HR 84 | Resp 16 | Ht 67.5 in | Wt 320.0 lb

## 2019-03-24 DIAGNOSIS — I10 Essential (primary) hypertension: Secondary | ICD-10-CM

## 2019-03-24 DIAGNOSIS — K3184 Gastroparesis: Secondary | ICD-10-CM

## 2019-03-24 DIAGNOSIS — E1165 Type 2 diabetes mellitus with hyperglycemia: Secondary | ICD-10-CM | POA: Diagnosis not present

## 2019-03-24 DIAGNOSIS — F3170 Bipolar disorder, currently in remission, most recent episode unspecified: Secondary | ICD-10-CM

## 2019-03-24 LAB — POCT GLYCOSYLATED HEMOGLOBIN (HGB A1C): Hemoglobin A1C: 14.4 % — AB (ref 4.0–5.6)

## 2019-03-24 NOTE — Progress Notes (Signed)
[facility] [address], [place] [address]  Internal MEDICINE  Office Visit Note  Patient Name: Laura Escobar  [id number]  [id number]  Date of Service: 03/24/2019   Complaints/HPI Pt is here for establishment of PCP. Chief Complaint  Patient presents with  . Medical Management of Chronic Issues    new patient   . Diabetes  . Hypertension  . Quality Metric Gaps    physical needed, foot exam,    HPI PT is here to establish care.  She has a history of bipolar disorder, anxiety, HTN and DM.  She sees Endocrinology for her DM however since covid, she has not seen anyone.    Pt sees Vickki Muff, podiatry and will get diabetic foot exam with him. Family counseling helps her manage bipolar disorder and anxiety/depression.       Current Medication: Outpatient Encounter Medications as of 03/24/2019  Medication Sig  . atorvastatin (LIPITOR) 10 MG tablet Take 10 mg by mouth daily.  . insulin aspart protamine - aspart (NOVOLOG MIX 70/30 FLEXPEN) (70-30) 100 UNIT/ML FlexPen Inject 50 Units into the skin 2 (two) times daily with a meal.   . losartan (COZAAR) 25 MG tablet Take 25 mg by mouth daily.  . promethazine (PHENERGAN) 12.5 MG tablet TAKE 1 TABLET BY MOUTH TWICE DAILY AS NEEDED FOR NAUSEA AND FOR VOMITING  . promethazine (PHENERGAN) 25 MG suppository Place 25 mg rectally as needed for nausea or vomiting.  . [DISCONTINUED] acetaminophen (TYLENOL) 325 MG tablet Take 2 tablets (650 mg total) by mouth every 6 (six) hours as needed for mild pain (or Fever >/= 101). (Patient not taking: Reported on 03/24/2019)  . [DISCONTINUED] amoxicillin (AMOXIL) 875 MG tablet Take 1 tablet (875 mg total) by mouth 2 (two) times daily. (Patient not taking: Reported on 03/24/2019)  . [DISCONTINUED] fluconazole (DIFLUCAN) 150 MG tablet Take 1 tablet (150 mg total) by mouth daily. Take one pill orally, as needed at end of antibiotic (Patient not taking: Reported on 03/24/2019)  . [DISCONTINUED]  furosemide (LASIX) 40 MG tablet Take 40 mg by mouth every morning.   . [DISCONTINUED] insulin NPH Human (HUMULIN N,NOVOLIN N) 100 UNIT/ML injection Inject 25-27 Units into the skin 2 (two) times daily. Pt uses 27 units in the morning and 25 units in the evening.  . [DISCONTINUED] insulin regular (NOVOLIN R,HUMULIN R) 100 units/mL injection Inject 6 Units into the skin 3 (three) times daily before meals. Unit dosage is 2, 4, 6 depending on number of carbs or 8 if BG is over 200. Pt is on an insulin pump.  . [DISCONTINUED] penicillin v potassium (VEETID) 500 MG tablet Take 1 tablet (500 mg total) by mouth 3 (three) times daily. (Patient not taking: Reported on 03/24/2019)  . [DISCONTINUED] promethazine (PHENERGAN) 25 MG suppository Place 1 suppository (25 mg total) rectally every 6 (six) hours as needed for nausea.  . [DISCONTINUED] promethazine (PHENERGAN) 25 MG tablet Take 25 mg by mouth as needed for nausea or vomiting.    No facility-administered encounter medications on file as of 03/24/2019.     Surgical History: Past Surgical History:  Procedure Laterality Date  . ABCESS DRAINAGE N/A    Buttocks 2011, right groin 2013  . ABDOMINAL HYSTERECTOMY    . AMPUTATION TOE Right 06/24/2015   Procedure: AMPUTATION TOE;  Surgeon: Samara Deist, DPM;  Location: [facility] ORS;  Service: Podiatry;  Laterality: Right;    Medical History: Past Medical History:  Diagnosis Date  . Addison disease ([place]) 2011  .  Bipolar 1 disorder (HCC) 2011  . Charcot foot due to diabetes mellitus (HCC)   . Diabetes mellitus without complication (HCC) 1998  . Gastroparesis   . Hyperlipidemia   . Hypertension   . Ketoacidosis   . Marijuana abuse   . PONV (postoperative nausea and vomiting)     Family History: Family History  Problem Relation Age of Onset  . Diabetes Mother   . Stroke Mother   . Healthy Father     Social History   Socioeconomic History  . Marital status: Single    Spouse name: Not on file  .  Number of children: Not on file  . Years of education: Not on file  . Highest education level: Not on file  Occupational History  . Not on file  Social Needs  . Financial resource strain: Not on file  . Food insecurity    Worry: Not on file    Inability: Not on file  . Transportation needs    Medical: Not on file    Non-medical: Not on file  Tobacco Use  . Smoking status: Former Smoker    Packs/day: 0.25    Types: Cigarettes  . Smokeless tobacco: Never Used  Substance and Sexual Activity  . Alcohol use: No    Alcohol/week: 0.0 standard drinks    Comment: 1 glasas of wine twice a year  . Drug use: Yes    Types: Marijuana    Comment: 3-4 joints 4-5 days a week  . Sexual activity: Yes    Comment: NONE  Lifestyle  . Physical activity    Days per week: Not on file    Minutes per session: Not on file  . Stress: Not on file  Relationships  . Social Musicianconnections    Talks on phone: Not on file    Gets together: Not on file    Attends religious service: Not on file    Active member of club or organization: Not on file    Attends meetings of clubs or organizations: Not on file    Relationship status: Not on file  . Intimate partner violence    Fear of current or ex partner: Not on file    Emotionally abused: Not on file    Physically abused: Not on file    Forced sexual activity: Not on file  Other Topics Concern  . Not on file  Social History Narrative  . Not on file     Review of Systems  Vital Signs: BP (!) 146/84 (BP Location: Right Arm, Patient Position: Sitting, Cuff Size: Normal)   Pulse 84   Resp 16   Ht 5' 7.5" (1.715 m)   Wt (!) 320 lb (145.2 kg)   LMP 08/08/2016 (Exact Date)   SpO2 100%   BMI 49.38 kg/m    Physical Exam Vitals signs and nursing note reviewed.  Constitutional:      General: She is not in acute distress.    Appearance: She is well-developed. She is not diaphoretic.  HENT:     Head: Normocephalic and atraumatic.     Mouth/Throat:      Pharynx: No oropharyngeal exudate.  Eyes:     Pupils: Pupils are equal, round, and reactive to light.  Neck:     Musculoskeletal: Normal range of motion and neck supple.     Thyroid: No thyromegaly.     Vascular: No JVD.     Trachea: No tracheal deviation.  Cardiovascular:     Rate and Rhythm:  Normal rate and regular rhythm.     Heart sounds: Normal heart sounds. No murmur. No friction rub. No gallop.   Pulmonary:     Effort: Pulmonary effort is normal. No respiratory distress.     Breath sounds: Normal breath sounds. No wheezing or rales.  Chest:     Chest wall: No tenderness.  Abdominal:     Palpations: Abdomen is soft.     Tenderness: There is no abdominal tenderness. There is no guarding.  Musculoskeletal: Normal range of motion.  Lymphadenopathy:     Cervical: No cervical adenopathy.  Skin:    General: Skin is warm and dry.  Neurological:     Mental Status: She is alert and oriented to person, place, and time.     Cranial Nerves: No cranial nerve deficit.  Psychiatric:        Behavior: Behavior normal.        Thought Content: Thought content normal.        Judgment: Judgment normal.     Assessment/Plan: 1. Uncontrolled type 2 diabetes mellitus with hyperglycemia (HCC) A1C is 14.4 today. Referral to endocrinology for management.   - POCT HgB A1C - Ambulatory referral to Endocrinology  2. Essential hypertension Slightly elevated today, continue losartan as directed.    3. Gastroparesis Most likely from diabetes.  She takes phenergan currently.   4. Bipolar disorder in partial remission, most recent episode unspecified type (HCC) Continue to see counselor as before, and continue current treatment.   General Counseling: Marcelle Smilingatasha verbalizes understanding of the findings of todays visit and agrees with plan of treatment. I have discussed any further diagnostic evaluation that may be needed or ordered today. We also reviewed her medications today. she has been  encouraged to call the office with any questions or concerns that should arise related to todays visit.  Orders Placed This Encounter  Procedures  . POCT HgB A1C    No orders of the defined types were placed in this encounter.   Time spent: 15 Minutes   This patient was seen by Blima LedgerAdam Alease Fait AGNP-C in Collaboration with Dr Lyndon CodeFozia M Khan as a part of collaborative care agreement  Johnna AcostaAdam J. Renesmae Donahey AGNP-C Internal Medicine

## 2019-03-25 ENCOUNTER — Encounter: Payer: Self-pay | Admitting: Adult Health

## 2019-05-06 ENCOUNTER — Encounter: Payer: Medicare Other | Admitting: Adult Health

## 2019-08-25 ENCOUNTER — Other Ambulatory Visit
Admission: RE | Admit: 2019-08-25 | Discharge: 2019-08-25 | Disposition: A | Discharge status: Home / Self Care | Payer: Medicare Other | Source: Ambulatory Visit | Attending: Internal Medicine | Admitting: Internal Medicine

## 2019-08-25 ENCOUNTER — Other Ambulatory Visit: Payer: Self-pay

## 2019-08-25 DIAGNOSIS — Z20822 Contact with and (suspected) exposure to covid-19: Secondary | ICD-10-CM | POA: Diagnosis not present

## 2019-08-25 DIAGNOSIS — Z01812 Encounter for preprocedural laboratory examination: Secondary | ICD-10-CM | POA: Diagnosis present

## 2019-08-26 LAB — SARS CORONAVIRUS 2 (TAT 6-24 HRS): SARS Coronavirus 2: NEGATIVE

## 2019-08-27 ENCOUNTER — Ambulatory Visit
Admission: RE | Admit: 2019-08-27 | Discharge: 2019-08-27 | Disposition: A | Discharge status: Home / Self Care | Payer: Medicare Other | Attending: Internal Medicine | Admitting: Internal Medicine

## 2019-08-27 ENCOUNTER — Encounter: Payer: Self-pay | Admitting: Internal Medicine

## 2019-08-27 ENCOUNTER — Encounter: Payer: Self-pay | Admitting: Certified Registered"

## 2019-08-27 ENCOUNTER — Other Ambulatory Visit: Payer: Self-pay

## 2019-08-27 ENCOUNTER — Encounter
Admission: RE | Disposition: A | Discharge status: Home / Self Care | Payer: Self-pay | Source: Home / Self Care | Attending: Internal Medicine

## 2019-08-27 DIAGNOSIS — E785 Hyperlipidemia, unspecified: Secondary | ICD-10-CM | POA: Diagnosis not present

## 2019-08-27 DIAGNOSIS — I10 Essential (primary) hypertension: Secondary | ICD-10-CM | POA: Insufficient documentation

## 2019-08-27 DIAGNOSIS — Z79899 Other long term (current) drug therapy: Secondary | ICD-10-CM | POA: Insufficient documentation

## 2019-08-27 DIAGNOSIS — Z794 Long term (current) use of insulin: Secondary | ICD-10-CM | POA: Diagnosis not present

## 2019-08-27 DIAGNOSIS — E1143 Type 2 diabetes mellitus with diabetic autonomic (poly)neuropathy: Secondary | ICD-10-CM | POA: Insufficient documentation

## 2019-08-27 DIAGNOSIS — R1013 Epigastric pain: Secondary | ICD-10-CM | POA: Diagnosis present

## 2019-08-27 DIAGNOSIS — K3184 Gastroparesis: Secondary | ICD-10-CM | POA: Insufficient documentation

## 2019-08-27 DIAGNOSIS — E271 Primary adrenocortical insufficiency: Secondary | ICD-10-CM | POA: Insufficient documentation

## 2019-08-27 DIAGNOSIS — Z5309 Procedure and treatment not carried out because of other contraindication: Secondary | ICD-10-CM | POA: Diagnosis not present

## 2019-08-27 DIAGNOSIS — R112 Nausea with vomiting, unspecified: Secondary | ICD-10-CM | POA: Insufficient documentation

## 2019-08-27 LAB — GLUCOSE, CAPILLARY
Glucose-Capillary: 404 mg/dL — ABNORMAL HIGH (ref 70–99)
Glucose-Capillary: 445 mg/dL — ABNORMAL HIGH (ref 70–99)

## 2019-08-27 SURGERY — ESOPHAGOGASTRODUODENOSCOPY (EGD) WITH PROPOFOL
Anesthesia: General

## 2019-08-27 MED ORDER — SODIUM CHLORIDE 0.9 % IV SOLN: INTRAVENOUS | Status: DC

## 2019-08-27 MED ORDER — MIDAZOLAM HCL 2 MG/2ML IJ SOLN
INTRAMUSCULAR | Status: AC
  Filled 2019-08-27: qty 2

## 2019-08-27 MED ORDER — GLYCOPYRROLATE 0.2 MG/ML IJ SOLN
INTRAMUSCULAR | Status: AC
  Filled 2019-08-27: qty 1

## 2019-08-27 NOTE — Interval H&P Note (Signed)
History and Physical Interval Note:  08/27/2019 9:05 AM  Laura Escobar  has presented today for surgery, with the diagnosis of ABD PAIN DYSPEPSIA.  The various methods of treatment have been discussed with the patient and family. After consideration of risks, benefits and other options for treatment, the patient has consented to  Procedure(s): ESOPHAGOGASTRODUODENOSCOPY (EGD) WITH PROPOFOL (N/A) as a surgical intervention.  The patient's history has been reviewed, patient examined, no change in status, stable for surgery.  I have reviewed the patient's chart and labs.  Questions were answered to the patient's satisfaction.     [place], [place]

## 2019-08-27 NOTE — Progress Notes (Signed)
Pt came in with N/V. Pt blood sugar was 445, on recheck it was 404. Pt started vomiting. MDs decided to cancel procedure. Pt was advised by MD to go to ED to be evaluated. Pt stated she did not want to go to ED.

## 2019-08-27 NOTE — H&P (Signed)
Outpatient short stay form Pre-procedure 08/27/2019 9:02 AM Naseer Hearn K. Norma Fredrickson, M.D.  Primary Physician: Blima Ledger, NP  Reason for visit:  Epigastric pain, N/V, melena  History of present illness:  39 y/o female with hx of uncontrolled DM presents with epigastric pain and reportedly intractable vomiting. Has some recurrent nausea. Has previous endoscopic workup with findings only of esophagitis, gastritis, duodenitis without gastric outlet obstruction (jan 2018).    Current Facility-Administered Medications:  .  0.9 %  sodium chloride infusion, , Intravenous, Continuous, Mardee Clune, Boykin Nearing, MD  Medications Prior to Admission  Medication Sig Dispense Refill Last Dose  . atorvastatin (LIPITOR) 10 MG tablet Take 10 mg by mouth daily.   08/26/2019 at Unknown time  . insulin aspart protamine - aspart (NOVOLOG MIX 70/30 FLEXPEN) (70-30) 100 UNIT/ML FlexPen Inject 50 Units into the skin 2 (two) times daily with a meal.    08/26/2019 at Unknown time  . losartan (COZAAR) 25 MG tablet Take 25 mg by mouth daily.   08/26/2019 at Unknown time  . promethazine (PHENERGAN) 12.5 MG tablet TAKE 1 TABLET BY MOUTH TWICE DAILY AS NEEDED FOR NAUSEA AND FOR VOMITING     . promethazine (PHENERGAN) 25 MG suppository Place 25 mg rectally as needed for nausea or vomiting.        Allergies  Allergen Reactions  . Metformin And Related      Past Medical History:  Diagnosis Date  . Addison disease (HCC) 2011  . Bipolar 1 disorder (HCC) 2011  . Charcot foot due to diabetes mellitus (HCC)   . Diabetes mellitus without complication (HCC) 1998  . Gastroparesis   . Hyperlipidemia   . Hypertension   . Ketoacidosis   . Marijuana abuse   . PONV (postoperative nausea and vomiting)     Review of systems:  Otherwise negative.    Physical Exam  Gen: Alert, oriented. Appears stated age.  HEENT: [place]/AT. PERRLA. Lungs: CTA, no wheezes. CV: RR nl S1, S2. Abd: soft, benign, no masses. BS+ Ext: No edema. Pulses  2+    Planned procedures: Proceed with EGD. The patient understands the nature of the planned procedure, indications, risks, alternatives and potential complications including but not limited to bleeding, infection, perforation, damage to internal organs and possible oversedation/side effects from anesthesia. The patient agrees and gives consent to proceed.  Please refer to procedure notes for findings, recommendations and patient disposition/instructions.     Fayetta Sorenson K. Norma Fredrickson, M.D. Gastroenterology 08/27/2019  9:02 AM

## 2021-01-31 ENCOUNTER — Other Ambulatory Visit: Payer: Self-pay | Admitting: Gerontology

## 2021-01-31 DIAGNOSIS — Z1231 Encounter for screening mammogram for malignant neoplasm of breast: Secondary | ICD-10-CM

## 2021-02-17 ENCOUNTER — Ambulatory Visit (INDEPENDENT_AMBULATORY_CARE_PROVIDER_SITE_OTHER): Payer: Medicare Other

## 2021-02-17 ENCOUNTER — Other Ambulatory Visit: Payer: Self-pay

## 2021-02-17 ENCOUNTER — Ambulatory Visit
Admission: EM | Admit: 2021-02-17 | Discharge: 2021-02-17 | Disposition: A | Discharge status: Home / Self Care | Payer: Medicare Other

## 2021-02-17 DIAGNOSIS — M25561 Pain in right knee: Secondary | ICD-10-CM

## 2021-02-17 DIAGNOSIS — S39012A Strain of muscle, fascia and tendon of lower back, initial encounter: Secondary | ICD-10-CM

## 2021-02-17 DIAGNOSIS — M25532 Pain in left wrist: Secondary | ICD-10-CM | POA: Diagnosis not present

## 2021-02-17 DIAGNOSIS — M25531 Pain in right wrist: Secondary | ICD-10-CM

## 2021-02-17 DIAGNOSIS — S63501A Unspecified sprain of right wrist, initial encounter: Secondary | ICD-10-CM

## 2021-02-17 DIAGNOSIS — W19XXXA Unspecified fall, initial encounter: Secondary | ICD-10-CM

## 2021-02-17 DIAGNOSIS — S63502A Unspecified sprain of left wrist, initial encounter: Secondary | ICD-10-CM

## 2021-02-17 DIAGNOSIS — S8001XA Contusion of right knee, initial encounter: Secondary | ICD-10-CM

## 2021-02-17 MED ORDER — BACLOFEN 20 MG PO TABS: 20.0000 mg | ORAL_TABLET | Freq: Three times a day (TID) | ORAL | 0 refills | Status: DC

## 2021-02-17 MED ORDER — IBUPROFEN 600 MG PO TABS: 600.0000 mg | ORAL_TABLET | Freq: Four times a day (QID) | ORAL | 0 refills | Status: DC | PRN

## 2021-02-17 NOTE — ED Triage Notes (Signed)
Pt c/o trip and fall at [facility] today, pt reports she slipped on a rug. Pt was offered to have EMS called but declined. Pt states she called her PCP and was advised to be evaluated in UC/ED. Pt does have previous injuries to her right leg. Pt has pain in her left hand, right wrist, right knee and right hip.

## 2021-02-17 NOTE — Discharge Instructions (Addendum)
Take the ibuprofen, 600 mg every 6 hours with food, on a schedule for the next 48 hours and then as needed.  Baclofen 20 mg 3 times a day as needed for spasms in your low back.  I would take it on a schedule for the next 2 days and then as needed  Apply moist heat to your low back for 20 minutes at a time 2-3 times a day.  You may apply ice or moist heat for 20 minutes at a time to each of your wrist as well to help you with pain.  Your knee also.  Follow the back exercises given your discharge paperwork.  If you develop any new or worsening symptoms return for reevaluation.

## 2021-02-17 NOTE — ED Provider Notes (Signed)
[facility]    CSN: [id number] Arrival date & time: 02/17/21  1438      History   Chief Complaint Chief Complaint  Patient presents with   Fall    02/17/21    HPI Laura Escobar is a 40 y.o. female.   HPI  40 year old female here for evaluation of pain after a fall.  Patient indicates that she was walking into a store when she slipped on a saturated rug of some kind falling forward onto a concrete floor.  She indicates that she tried to break her fall with both of her wrists but wind up impacting her right side.  She is complaining of pain in both wrists, her low back on the right, and her right knee.  She states that she is having numbness and tingling in the fingertips of both hands and tingling in her right leg.  She denies weakness.  She is ambulatory.  She is not taking anything for her symptoms.  Past Medical History:  Diagnosis Date   Addison disease (HCC) 2011   Bipolar 1 disorder (HCC) 2011   Charcot foot due to diabetes mellitus (HCC)    Diabetes mellitus without complication (HCC) 1998   Gastroparesis    Hyperlipidemia    Hypertension    Ketoacidosis    Marijuana abuse    PONV (postoperative nausea and vomiting)     Patient Active Problem List   Diagnosis Date Noted   Pressure ulcer 04/16/2015   Cellulitis 04/15/2015   DKA (diabetic ketoacidoses) 03/15/2015   UTI (lower urinary tract infection) 03/15/2015   Gastroparesis 03/15/2015    Past Surgical History:  Procedure Laterality Date   ABCESS DRAINAGE N/A    Buttocks 2011, right groin 2013   ABDOMINAL HYSTERECTOMY     AMPUTATION TOE Right 06/24/2015   Procedure: AMPUTATION TOE;  Surgeon: Gwyneth Revels, DPM;  Location: [facility];  Service: Podiatry;  Laterality: Right;    OB History   No obstetric history on file.      Home Medications    Prior to Admission medications   Medication Sig Start Date End Date Taking? Authorizing Provider  baclofen (LIORESAL) 20 MG tablet Take 1 tablet  (20 mg total) by mouth 3 (three) times daily. 02/17/21  Yes Becky Augusta, NP  Cholecalciferol 125 MCG (5000 UT) capsule Take by mouth. 01/20/21  Yes [provider]  cyanocobalamin 1000 MCG tablet Take 2 tablets daily for 2 weeks, then reduce to 1 tablet daily thereafter for Vitamin B12 Deficiency. 01/20/21  Yes [provider]  hydrochlorothiazide (MICROZIDE) 12.5 MG capsule Take 1 capsule by mouth daily. 07/21/19  Yes [provider]  ibuprofen (ADVIL) 600 MG tablet Take 1 tablet (600 mg total) by mouth every 6 (six) hours as needed. 02/17/21  Yes Becky Augusta, NP  insulin aspart protamine - aspart (NOVOLOG MIX 70/30 FLEXPEN) (70-30) 100 UNIT/ML FlexPen Inject 50 Units into the skin 2 (two) times daily with a meal.  08/22/18  Yes [provider]  losartan (COZAAR) 100 MG tablet Take 100 mg by mouth daily. 12/24/20  Yes [provider]  promethazine (PHENERGAN) 12.5 MG tablet TAKE 1 TABLET BY MOUTH TWICE DAILY AS NEEDED FOR NAUSEA AND FOR VOMITING 12/20/18  Yes [provider]  promethazine (PHENERGAN) 25 MG suppository Place 25 mg rectally as needed for nausea or vomiting.   Yes [provider]  atorvastatin (LIPITOR) 20 MG tablet Take 20 mg by mouth daily. 02/17/21   [provider]  Family History Family History  Problem Relation Age of Onset   Diabetes Mother    Stroke Mother    Healthy Father     Social History Social History   Tobacco Use   Smoking status: Former    Packs/day: 0.25    Pack years: 0.00    Types: Cigarettes   Smokeless tobacco: Never  Vaping Use   Vaping Use: Never used  Substance Use Topics   Alcohol use: No    Alcohol/week: 0.0 standard drinks    Comment: 1 glasas of wine twice a year   Drug use: Yes    Types: Marijuana    Comment: 3-4 joints 4-5 days a week     Allergies   Metformin and related   Review of Systems Review of Systems  Constitutional:  Negative for activity change and  appetite change.  Musculoskeletal:  Positive for arthralgias and back pain. Negative for joint swelling.  Skin:  Negative for color change.  Neurological:  Positive for numbness. Negative for weakness.  Hematological:  Negative for adenopathy.  Psychiatric/Behavioral: Negative.      Physical Exam Triage Vital Signs ED Triage Vitals  Enc Vitals Group     BP 02/17/21 1510 135/73     Pulse Rate 02/17/21 1510 76     Resp 02/17/21 1510 18     Temp 02/17/21 1510 98.1 F (36.7 C)     Temp Source 02/17/21 1510 Oral     SpO2 02/17/21 1510 100 %     Weight 02/17/21 1506 (!) 318 lb (144.2 kg)     Height 02/17/21 1506 5' 7.5" (1.715 m)     Head Circumference --      Peak Flow --      Pain Score 02/17/21 1505 8     Pain Loc --      Pain Edu? --      Excl. in GC? --    No data found.  Updated Vital Signs BP 135/73 (BP Location: Left Arm)   Pulse 76   Temp 98.1 F (36.7 C) (Oral)   Resp 18   Ht 5' 7.5" (1.715 m)   Wt (!) 318 lb (144.2 kg)   LMP 08/08/2016 (Exact Date)   SpO2 100%   BMI 49.07 kg/m   Visual Acuity Right Eye Distance:   Left Eye Distance:   Bilateral Distance:    Right Eye Near:   Left Eye Near:    Bilateral Near:     Physical Exam Vitals and nursing note reviewed.  Constitutional:      General: She is not in acute distress.    Appearance: Normal appearance. She is obese. She is not ill-appearing.  HENT:     Head: Normocephalic and atraumatic.  Cardiovascular:     Rate and Rhythm: Normal rate and regular rhythm.     Pulses: Normal pulses.     Heart sounds: Normal heart sounds. No murmur heard.   No gallop.  Pulmonary:     Effort: Pulmonary effort is normal.     Breath sounds: Normal breath sounds. No wheezing, rhonchi or rales.  Musculoskeletal:        General: Tenderness and signs of injury present. No swelling or deformity.  Skin:    General: Skin is warm and dry.     Capillary Refill: Capillary refill takes less than 2 seconds.     Findings:  No bruising.  Neurological:     General: No focal deficit present.     Mental  Status: She is alert and oriented to person, place, and time.     Sensory: No sensory deficit.     Motor: No weakness.  Psychiatric:        Mood and Affect: Mood normal.        Behavior: Behavior normal.        Thought Content: Thought content normal.        Judgment: Judgment normal.     UC Treatments / Results  Labs (all labs ordered are listed, but only abnormal results are displayed) Labs Reviewed - No data to display  EKG   Radiology DG Wrist Complete Left  Result Date: 02/17/2021 CLINICAL DATA:  Left wrist pain after fall today. EXAM: LEFT WRIST - COMPLETE 3+ VIEW COMPARISON:  None. FINDINGS: There is no evidence of fracture or dislocation. There is no evidence of arthropathy or other focal bone abnormality. Soft tissues are unremarkable. IMPRESSION: Negative. Electronically Signed   By: Lupita RaiderJames  Green Jr M.D.   On: 02/17/2021 16:46   DG Wrist Complete Right  Result Date: 02/17/2021 CLINICAL DATA:  Right wrist pain after fall today. EXAM: RIGHT WRIST - COMPLETE 3+ VIEW COMPARISON:  None. FINDINGS: There is no evidence of fracture or dislocation. There is no evidence of arthropathy or other focal bone abnormality. Soft tissues are unremarkable. IMPRESSION: Negative. Electronically Signed   By: Lupita RaiderJames  Green Jr M.D.   On: 02/17/2021 16:44   DG Knee Complete 4 Views Right  Result Date: 02/17/2021 CLINICAL DATA:  Right knee pain. EXAM: RIGHT KNEE - COMPLETE 4+ VIEW COMPARISON:  None. FINDINGS: No evidence of fracture, dislocation, or joint effusion. No evidence of arthropathy or other focal bone abnormality. Soft tissues are unremarkable. IMPRESSION: Negative. Electronically Signed   By: Lupita RaiderJames  Green Jr M.D.   On: 02/17/2021 16:45    Procedures Procedures (including critical care time)  Medications Ordered in UC Medications - No data to display  Initial Impression / Assessment and Plan / UC Course  I  have reviewed the triage vital signs and the nursing notes.  Pertinent labs & imaging results that were available during my care of the patient were reviewed by me and considered in my medical decision making (see chart for details).  Patient is a nontoxic-appearing 40 year old female here for evaluation of musculoskeletal complaints as outlined in the HPI above.  Patient's physical exam reveals a benign cardiopulmonary exam.  Patient's axials frame is in normal alignment with no midline spinal tenderness.  Patient does have tenderness to palpation of the lower lateral lumbar paraspinous region.  Patient is full range of motion both active and passive in both of her wrists and is complaining of soreness along the lateral aspect of her left wrist and hand and similarly on her right hand.  There is no crepitus on exam.  Patient's grips are 5/5 bilaterally.  Right knee exam reveals a knee that is in normal anatomical alignment without edema, ecchymosis, or erythema.  She is complaining of tenderness with palpation of the patella without evidence of crepitus.  She is also complaining of pain with palpation of the quadriceps complex but no pain in the medial or lateral joint line, popliteal fossa, or over the tibial tuberosity.  I suspect the patient's pain is soft tissue in nature but will obtain radiographs of both wrists and her right knee to rule out bony involvement.  Right knee films independently reviewed and evaluated by me.  Interpretation: No evidence of fracture or dislocation.  Awaiting radiology overread.  Left wrist films independently reviewed and evaluated by me.  Interpretation: No evidence of fracture or dislocation awaiting radiology overread.  Right wrist films independently reviewed and evaluated by me.  Interpretation: No evidence of fracture or dislocation.  Awaiting radiology overread.  Radiology interpretation of all films is that they are negative for fracture or dislocation.  Will  discharge patient home with a diagnosis of bilateral contused wrists, low back strain, and right knee contusion.  We will have patient use ice or moist heat as well as over-the-counter Tylenol and ibuprofen as needed for pain relief.  Will provide baclofen to help with low back pain due to muscle spasms.  Back exercise handout given to patient.  Final Clinical Impressions(s) / UC Diagnoses   Final diagnoses:  Wrist sprain, left, initial encounter  Wrist sprain, right, initial encounter  Lumbar strain, initial encounter  Contusion of right knee, initial encounter     Discharge Instructions      Take the ibuprofen, 600 mg every 6 hours with food, on a schedule for the next 48 hours and then as needed.  Baclofen 20 mg 3 times a day as needed for spasms in your low back.  I would take it on a schedule for the next 2 days and then as needed  Apply moist heat to your low back for 20 minutes at a time 2-3 times a day.  Follow the back exercises given your discharge paperwork.  If you develop any new or worsening symptoms return for reevaluation.      ED Prescriptions     Medication Sig Dispense Auth. Provider   baclofen (LIORESAL) 20 MG tablet Take 1 tablet (20 mg total) by mouth 3 (three) times daily. 30 each Becky Augusta, NP   ibuprofen (ADVIL) 600 MG tablet Take 1 tablet (600 mg total) by mouth every 6 (six) hours as needed. 30 tablet Becky Augusta, NP      PDMP not reviewed this encounter.   Becky Augusta, NP 02/17/21 1655

## 2021-09-05 ENCOUNTER — Encounter: Payer: Self-pay | Admitting: Emergency Medicine

## 2021-09-05 ENCOUNTER — Other Ambulatory Visit: Payer: Self-pay

## 2021-09-05 ENCOUNTER — Emergency Department: Payer: Medicare Other

## 2021-09-05 ENCOUNTER — Inpatient Hospital Stay
Admission: EM | Admit: 2021-09-05 | Discharge: 2021-09-07 | DRG: 617 | Disposition: A | Discharge status: Home / Self Care | Payer: Medicare Other | Attending: Hospitalist | Admitting: Hospitalist

## 2021-09-05 DIAGNOSIS — Z28310 Unvaccinated for covid-19: Secondary | ICD-10-CM

## 2021-09-05 DIAGNOSIS — E1169 Type 2 diabetes mellitus with other specified complication: Secondary | ICD-10-CM | POA: Diagnosis not present

## 2021-09-05 DIAGNOSIS — E11621 Type 2 diabetes mellitus with foot ulcer: Secondary | ICD-10-CM | POA: Diagnosis present

## 2021-09-05 DIAGNOSIS — F1210 Cannabis abuse, uncomplicated: Secondary | ICD-10-CM | POA: Diagnosis present

## 2021-09-05 DIAGNOSIS — Z20822 Contact with and (suspected) exposure to covid-19: Secondary | ICD-10-CM | POA: Diagnosis present

## 2021-09-05 DIAGNOSIS — E8881 Metabolic syndrome: Secondary | ICD-10-CM | POA: Diagnosis present

## 2021-09-05 DIAGNOSIS — Z823 Family history of stroke: Secondary | ICD-10-CM

## 2021-09-05 DIAGNOSIS — M869 Osteomyelitis, unspecified: Secondary | ICD-10-CM | POA: Diagnosis not present

## 2021-09-05 DIAGNOSIS — K3184 Gastroparesis: Secondary | ICD-10-CM | POA: Diagnosis not present

## 2021-09-05 DIAGNOSIS — E119 Type 2 diabetes mellitus without complications: Secondary | ICD-10-CM

## 2021-09-05 DIAGNOSIS — M86171 Other acute osteomyelitis, right ankle and foot: Secondary | ICD-10-CM | POA: Diagnosis not present

## 2021-09-05 DIAGNOSIS — E782 Mixed hyperlipidemia: Secondary | ICD-10-CM

## 2021-09-05 DIAGNOSIS — E11610 Type 2 diabetes mellitus with diabetic neuropathic arthropathy: Secondary | ICD-10-CM | POA: Diagnosis present

## 2021-09-05 DIAGNOSIS — E1165 Type 2 diabetes mellitus with hyperglycemia: Secondary | ICD-10-CM | POA: Diagnosis present

## 2021-09-05 DIAGNOSIS — E785 Hyperlipidemia, unspecified: Secondary | ICD-10-CM

## 2021-09-05 DIAGNOSIS — I10 Essential (primary) hypertension: Secondary | ICD-10-CM | POA: Diagnosis present

## 2021-09-05 DIAGNOSIS — Z79899 Other long term (current) drug therapy: Secondary | ICD-10-CM

## 2021-09-05 DIAGNOSIS — Z720 Tobacco use: Secondary | ICD-10-CM | POA: Diagnosis present

## 2021-09-05 DIAGNOSIS — E66813 Obesity, class 3: Secondary | ICD-10-CM | POA: Diagnosis present

## 2021-09-05 DIAGNOSIS — L089 Local infection of the skin and subcutaneous tissue, unspecified: Secondary | ICD-10-CM | POA: Diagnosis present

## 2021-09-05 DIAGNOSIS — Z6841 Body Mass Index (BMI) 40.0 and over, adult: Secondary | ICD-10-CM

## 2021-09-05 DIAGNOSIS — M19071 Primary osteoarthritis, right ankle and foot: Secondary | ICD-10-CM | POA: Diagnosis present

## 2021-09-05 DIAGNOSIS — E1143 Type 2 diabetes mellitus with diabetic autonomic (poly)neuropathy: Secondary | ICD-10-CM | POA: Diagnosis present

## 2021-09-05 DIAGNOSIS — L97514 Non-pressure chronic ulcer of other part of right foot with necrosis of bone: Secondary | ICD-10-CM | POA: Diagnosis present

## 2021-09-05 DIAGNOSIS — E538 Deficiency of other specified B group vitamins: Secondary | ICD-10-CM | POA: Diagnosis present

## 2021-09-05 DIAGNOSIS — Z888 Allergy status to other drugs, medicaments and biological substances status: Secondary | ICD-10-CM

## 2021-09-05 DIAGNOSIS — Z833 Family history of diabetes mellitus: Secondary | ICD-10-CM

## 2021-09-05 DIAGNOSIS — F319 Bipolar disorder, unspecified: Secondary | ICD-10-CM | POA: Diagnosis present

## 2021-09-05 DIAGNOSIS — Z794 Long term (current) use of insulin: Secondary | ICD-10-CM

## 2021-09-05 DIAGNOSIS — F17210 Nicotine dependence, cigarettes, uncomplicated: Secondary | ICD-10-CM | POA: Diagnosis not present

## 2021-09-05 DIAGNOSIS — E271 Primary adrenocortical insufficiency: Secondary | ICD-10-CM | POA: Diagnosis present

## 2021-09-05 LAB — CBC WITH DIFFERENTIAL/PLATELET
Abs Immature Granulocytes: 0.02 10*3/uL (ref 0.00–0.07)
Basophils Absolute: 0 10*3/uL (ref 0.0–0.1)
Basophils Relative: 0 %
Eosinophils Absolute: 0.1 10*3/uL (ref 0.0–0.5)
Eosinophils Relative: 1 %
HCT: 38.1 % (ref 36.0–46.0)
Hemoglobin: 12.5 g/dL (ref 12.0–15.0)
Immature Granulocytes: 0 %
Lymphocytes Relative: 29 %
Lymphs Abs: 2.7 10*3/uL (ref 0.7–4.0)
MCH: 29.5 pg (ref 26.0–34.0)
MCHC: 32.8 g/dL (ref 30.0–36.0)
MCV: 89.9 fL (ref 80.0–100.0)
Monocytes Absolute: 0.5 10*3/uL (ref 0.1–1.0)
Monocytes Relative: 5 %
Neutro Abs: 6.2 10*3/uL (ref 1.7–7.7)
Neutrophils Relative %: 65 %
Platelets: 274 10*3/uL (ref 150–400)
RBC: 4.24 MIL/uL (ref 3.87–5.11)
RDW: 11.9 % (ref 11.5–15.5)
WBC: 9.6 10*3/uL (ref 4.0–10.5)
nRBC: 0 % (ref 0.0–0.2)

## 2021-09-05 LAB — COMPREHENSIVE METABOLIC PANEL
ALT: 16 U/L (ref 0–44)
AST: 18 U/L (ref 15–41)
Albumin: 3.6 g/dL (ref 3.5–5.0)
Alkaline Phosphatase: 95 U/L (ref 38–126)
Anion gap: 8 (ref 5–15)
BUN: 12 mg/dL (ref 6–20)
CO2: 22 mmol/L (ref 22–32)
Calcium: 8.2 mg/dL — ABNORMAL LOW (ref 8.9–10.3)
Chloride: 102 mmol/L (ref 98–111)
Creatinine, Ser: 1.02 mg/dL — ABNORMAL HIGH (ref 0.44–1.00)
GFR, Estimated: >60 mL/min (ref >60)
Glucose, Bld: 442 mg/dL — ABNORMAL HIGH (ref 70–99)
Potassium: 3.9 mmol/L (ref 3.5–5.1)
Sodium: 132 mmol/L — ABNORMAL LOW (ref 135–145)
Total Bilirubin: 0.6 mg/dL (ref 0.3–1.2)
Total Protein: 7 g/dL (ref 6.5–8.1)

## 2021-09-05 LAB — LACTIC ACID, PLASMA
Lactic Acid, Venous: 2.8 mmol/L (ref 0.5–1.9)
Lactic Acid, Venous: 2.6 mmol/L (ref 0.5–1.9)

## 2021-09-05 LAB — RESP PANEL BY RT-PCR (FLU A&B, COVID) ARPGX2
Influenza A by PCR: NEGATIVE
Influenza B by PCR: NEGATIVE
SARS Coronavirus 2 by RT PCR: NEGATIVE

## 2021-09-05 LAB — MRSA NEXT GEN BY PCR, NASAL: MRSA by PCR Next Gen: NOT DETECTED

## 2021-09-05 LAB — GLUCOSE, CAPILLARY: Glucose-Capillary: 343 mg/dL — ABNORMAL HIGH (ref 70–99)

## 2021-09-05 MED ORDER — VANCOMYCIN HCL 2000 MG/400ML IV SOLN
2000.0000 mg | Freq: Once | INTRAVENOUS | Status: AC
Start: 2021-09-05 — End: 2021-09-05
  Administered 2021-09-05: 2000 mg via INTRAVENOUS
  Filled 2021-09-05: qty 400

## 2021-09-05 MED ORDER — HEPARIN SODIUM (PORCINE) 5000 UNIT/ML IJ SOLN
5000.0000 [IU] | Freq: Three times a day (TID) | INTRAMUSCULAR | Status: AC
  Administered 2021-09-05: 5000 [IU] via SUBCUTANEOUS
  Filled 2021-09-05: qty 1

## 2021-09-05 MED ORDER — PIPERACILLIN-TAZOBACTAM 3.375 G IVPB 30 MIN
3.3750 g | Freq: Once | INTRAVENOUS | Status: AC
  Administered 2021-09-05: 3.375 g via INTRAVENOUS
  Filled 2021-09-05: qty 50

## 2021-09-05 MED ORDER — ONDANSETRON HCL 4 MG/2ML IJ SOLN: 4.0000 mg | Freq: Four times a day (QID) | INTRAMUSCULAR | Status: DC | PRN

## 2021-09-05 MED ORDER — INSULIN ASPART 100 UNIT/ML IJ SOLN
0.0000 [IU] | Freq: Three times a day (TID) | INTRAMUSCULAR | Status: DC
  Administered 2021-09-06: 7 [IU] via SUBCUTANEOUS
  Administered 2021-09-06 – 2021-09-07 (×2): 11 [IU] via SUBCUTANEOUS
  Filled 2021-09-05 (×3): qty 1

## 2021-09-05 MED ORDER — ACETAMINOPHEN 650 MG RE SUPP: 650.0000 mg | Freq: Four times a day (QID) | RECTAL | Status: DC | PRN

## 2021-09-05 MED ORDER — HYDRALAZINE HCL 10 MG PO TABS
10.0000 mg | ORAL_TABLET | Freq: Four times a day (QID) | ORAL | Status: DC | PRN
  Filled 2021-09-05: qty 1

## 2021-09-05 MED ORDER — INSULIN ASPART 100 UNIT/ML IJ SOLN
0.0000 [IU] | Freq: Every day | INTRAMUSCULAR | Status: DC
  Administered 2021-09-05: 22:00:00 4 [IU] via SUBCUTANEOUS
  Filled 2021-09-05: qty 1

## 2021-09-05 MED ORDER — TRAMADOL HCL 50 MG PO TABS
50.0000 mg | ORAL_TABLET | Freq: Four times a day (QID) | ORAL | Status: DC | PRN
  Administered 2021-09-06: 50 mg via ORAL
  Filled 2021-09-05: qty 1

## 2021-09-05 MED ORDER — ONDANSETRON HCL 4 MG PO TABS: 4.0000 mg | ORAL_TABLET | Freq: Four times a day (QID) | ORAL | Status: DC | PRN

## 2021-09-05 MED ORDER — LACTATED RINGERS IV BOLUS
1000.0000 mL | Freq: Once | INTRAVENOUS | Status: AC
  Administered 2021-09-05: 1000 mL via INTRAVENOUS

## 2021-09-05 MED ORDER — ACETAMINOPHEN 325 MG PO TABS: 650.0000 mg | ORAL_TABLET | Freq: Four times a day (QID) | ORAL | Status: DC | PRN

## 2021-09-05 NOTE — Assessment & Plan Note (Signed)
-   Insulin sliding scale with at bedtime coverage - Check CBG 3 times daily AC - Goal inpatient blood glucose level is 140-180 - Heart healthy/carb modified diet now, n.p.o. after midnight

## 2021-09-05 NOTE — ED Notes (Addendum)
Provider at bedside. Pt noted w/ gangrene toe to lt foot , pt reports asymptoatic. 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 No drainage seen at this time    Consent for amputation of rt 5th digit under Dr. Ether Griffins secured

## 2021-09-05 NOTE — Assessment & Plan Note (Signed)
-   Recommend outpatient healthy and safe weight loss with diet and exercise via extensive discussion with PCP and/or referral to weight loss clinic

## 2021-09-05 NOTE — Assessment & Plan Note (Signed)
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 Luana Shu, podiatry has been consulted by EDP and the plan is for patient to have operation on 09/06/2021 - I will check MRSA PCR, we will continue Zosyn without vancomycin - N.p.o. after midnight - Admit to [facility] floor, observation, no telemetry

## 2021-09-05 NOTE — H&P (Addendum)
History and Physical   Laura Escobar [id number] DOB: Jun 16, 1981 DOA: 09/05/2021  PCP: Latanya Maudlin, NP  Outpatient Specialists: Dr. Luana Shu, podiatry Patient coming from: home  I have personally briefly reviewed patient's old medical records in [place].  Chief Concern: Right fifth toe wound  HPI: Laura Escobar is a 41 year old female with medical history of insulin-dependent diabetes mellitus type 2, hyperlipidemia, hypertension, morbid obesity, metabolic syndrome, who presents emergency department for chief concerns of right toe wound.  Patient has been following with podiatry outpatient and was told by podiatrist to present to the emergency department on Friday, 09/01/2021 for amputation of her right fifth toe.  Patient did not feel ready to present at that time and presented today to the emergency department.  EDP spoke with Dr. Vickki Muff who agrees that the patient needs to be admitted and plans for OR on 09/06/2021.  Initial vitals in the emergency department showed temperature of 97.7, respiration rate of 16, heart rate of 97, blood pressure 156/85, SPO2 100% on room air.  Labs in the emergency department showed serum sodium 132, potassium 3.9, chloride of 102, bicarb of 22, BUN of 12, serum creatinine of 1.02, nonfasting blood glucose 442, WBC 9.6, hemoglobin 12.5, platelets of 274.  COVID/influenza A/influenza B PCR were negative.  Blood cultures x2 have been collected in the emergency department.  HPI provided by patient.  She noticed the toe wound in July 2022 and it got better and then the would got worse. The wound started leaking in early November and she put wound care gel and it would heal but every time she put on shoes, the scab would fall off.   She felt 'some thing was loose' in December and she presented to podiatrist for further evaluation. She denies any fever, chest pain, shortness of breath. She states this is similar to prior episode of toe/bone  infection (right foot first toe).   Social history: She lives with her son at home. She uses tobacco products. She smokes about 6 cigarettes per day and at her peak, she smoked 4-5 packs per month. She endorses infrequent wine, and last drink was Sunday. She smokes THC daily. She is disabled.   Vaccination history: She is not vaccinated for covid 19.  ROS: Constitutional: no weight change, no fever ENT/Mouth: no sore throat, no rhinorrhea Eyes: no eye pain, no vision changes Cardiovascular: no chest pain, no dyspnea,  no edema, no palpitations Respiratory: no cough, no sputum, no wheezing Gastrointestinal: + nausea, no vomiting, no diarrhea, no constipation Genitourinary: no urinary incontinence, no dysuria, no hematuria Musculoskeletal: no arthralgias, no myalgias Skin: no skin lesions, no pruritus, Neuro: + weakness, no loss of consciousness, no syncope Psych: no anxiety, no depression, + decrease appetite Heme/Lymph: no bruising, no bleeding  ED Course: Discussed with emergency medicine provider, patient requiring hospitalization for chief concerns of suspected osteomyelitis of the right fifth toe requiring amputation.  Assessment/Plan  Principal Problem:   Osteomyelitis (HCC) Active Problems:   Gastroparesis   Insulin dependent type 2 diabetes mellitus ([place])   Essential hypertension   Hyperlipidemia   Obesity, Class III, BMI 40-49.9 (morbid obesity) ([place])   Tobacco abuse    Cardiovascular and Mediastinum Essential hypertension Assessment & Plan - Patient takes hydrochlorothiazide 12.5 mg p.o. daily, losartan 100 mg p.o. daily at home - Hydralazine 10 mg p.o. every 6 hours as needed for SBP greater than 175, 4 days ordered  Endocrine Insulin dependent type 2 diabetes mellitus ([place]) Assessment &  Plan - Insulin sliding scale with at bedtime coverage - Check CBG 3 times daily AC - Goal inpatient blood glucose level is 140-180 - Heart healthy/carb modified diet now,  n.p.o. after midnight  Musculoskeletal and Integument * Osteomyelitis (HCC) Assessment & Plan - Suspect osteomyelitis of the right fifth toe - Status post vancomycin and Zosyn per EDP - Dr. Luana Shu, podiatry has been consulted by EDP and the plan is for patient to have operation on 09/06/2021 - I will check MRSA PCR, we will continue Zosyn without vancomycin - N.p.o. after midnight - Admit to [place] floor, observation, no telemetry  Other Tobacco abuse Assessment & Plan - Patient endorses readiness to stop tobacco use. - Greater than 3 minutes spent on tobacco cessation counseling - Extensive counseling regarding continued tobacco use decreasing patient's ability to heal  Tobacco cessation counseling:  Week one, smoke 19 cigarettes per day. Week two, smoke 18 cigarettes per day. Week three, smoke 17 cigarettes per day, continue until smoking half the amount of cigarettes per day Discuss with PCP for pharmacologic assistance with smoking cessation Clean all indoor clothing, sheets, blankets, and freshen textile furniture to rid the smell of cigarettes Only smoke outside and wear outer covering Leave cigarettes and lighters outside in separate places During in-between cigarettes, if you feel the urge to smoke, use the following: stress squeezing devices/phone a trusted friend to talk you through the urge/walk in a safe environment Call [facility] if in need of nicotine patches to help with cessation   Obesity, Class III, BMI 40-49.9 (morbid obesity) ([place]) Assessment & Plan - Recommend outpatient healthy and safe weight loss with diet and exercise via extensive discussion with PCP and/or referral to weight loss clinic  Hyperlipidemia Assessment & Plan - Patient takes atorvastatin 20 mg nightly  DVT prophylaxis-I have ordered heparin 5000 units subcutaneous one-time dose due to anticipation of surgery on 09/06/2021 - A.m. team to reorder pharmacologic DVT prophylaxis when  appropriate  A.m. team to complete med reconciliation  Chart reviewed.   DVT prophylaxis: Heparin 5000 units once Code Status: Full code Diet: Heart healthy/carb modified Family Communication: no Disposition Plan: Pending podiatry inpatient evaluation, anticipate less than 2 night stay Consults called: Podiatry Admission status: MedSurg, observation, no telemetry  Past Medical History:  Diagnosis Date   Addison disease ([place]) 2011   Bipolar 1 disorder ([place]) 2011   Charcot foot due to diabetes mellitus ([place])    Diabetes mellitus without complication ([place]) [id number]   Gastroparesis    Hyperlipidemia    Hypertension    Ketoacidosis    Marijuana abuse    PONV (postoperative nausea and vomiting)    Past Surgical History:  Procedure Laterality Date   ABCESS DRAINAGE N/A    Buttocks 2011, right groin 2013   ABDOMINAL HYSTERECTOMY     AMPUTATION TOE Right 06/24/2015   Procedure: AMPUTATION TOE;  Surgeon: Samara Deist, DPM;  Location: [facility] ORS;  Service: Podiatry;  Laterality: Right;   Social History:  reports that she has quit smoking. Her smoking use included cigarettes. She smoked an average of .25 packs per day. She has never used smokeless tobacco. She reports current drug use. Drug: Marijuana. She reports that she does not drink alcohol.  Allergies  Allergen Reactions   Metformin And Related    Family History  Problem Relation Age of Onset   Diabetes Mother    Stroke Mother    Healthy Father    Family history: Family history reviewed and pertinent for  diabetes mellitus in mother.  Prior to Admission medications   Medication Sig Start Date End Date Taking? Authorizing Provider  atorvastatin (LIPITOR) 20 MG tablet Take 20 mg by mouth daily. 02/17/21   [provider]  baclofen (LIORESAL) 20 MG tablet Take 1 tablet (20 mg total) by mouth 3 (three) times daily. 02/17/21   Margarette Canada, NP  Cholecalciferol 125 MCG (5000 UT) capsule Take by mouth. 01/20/21   [provider]  cyanocobalamin 1000 MCG tablet Take 2 tablets daily for 2 weeks, then reduce to 1 tablet daily thereafter for Vitamin B12 Deficiency. 01/20/21   [provider]  hydrochlorothiazide (MICROZIDE) 12.5 MG capsule Take 1 capsule by mouth daily. 07/21/19   [provider]  ibuprofen (ADVIL) 600 MG tablet Take 1 tablet (600 mg total) by mouth every 6 (six) hours as needed. 02/17/21   Margarette Canada, NP  insulin aspart protamine - aspart (NOVOLOG MIX 70/30 FLEXPEN) (70-30) 100 UNIT/ML FlexPen Inject 50 Units into the skin 2 (two) times daily with a meal.  08/22/18   [provider]  losartan (COZAAR) 100 MG tablet Take 100 mg by mouth daily. 12/24/20   [provider]  promethazine (PHENERGAN) 12.5 MG tablet TAKE 1 TABLET BY MOUTH TWICE DAILY AS NEEDED FOR NAUSEA AND FOR VOMITING 12/20/18   [provider]  promethazine (PHENERGAN) 25 MG suppository Place 25 mg rectally as needed for nausea or vomiting.    [provider]   Physical Exam: Vitals:   09/05/21 1652 09/05/21 1730 09/05/21 1917 09/05/21 2049  BP:  (!) 158/99 (!) 144/76 (!) 142/81  Pulse:  98 91 83  Resp:  16 17 18   Temp:   97.9 F (36.6 C) 98 F (36.7 C)  TempSrc:   Oral   SpO2:  99% 100% 98%  Weight: (!) 142.4 kg     Height: 5' 7.5" (1.715 m)      Constitutional: appears older than chronological age, frail, chronically ill, NAD, calm, comfortable Eyes: PERRL, lids and conjunctivae normal ENMT: Mucous membranes are moist. Posterior pharynx clear of any exudate or lesions. Age-appropriate dentition. Hearing appropriate Neck: normal, supple, no masses, no thyromegaly Respiratory: clear to auscultation bilaterally, no wheezing, no crackles. Normal respiratory effort. No accessory muscle use.  Cardiovascular: Regular rate and rhythm, no murmurs / rubs / gallops. No extremity edema. 2+ pedal pulses. No carotid bruits.  Abdomen: Obese abdomen, with pannus, no tenderness, no masses  palpated, no hepatosplenomegaly. Bowel sounds positive.  Musculoskeletal: no clubbing / cyanosis. No joint deformity upper and lower extremities. Good ROM, no contractures, no atrophy. Normal muscle tone.  Skin: no rashes, lesions, ulcers. No induration.  Right fifth toe wound. Neurologic: Sensation intact. Strength 5/5 in all 4.  Psychiatric: Normal judgment and insight. Alert and oriented x 3. Normal mood.   EKG: Not indicated at this time  Right foot x-ray on Admission: I personally reviewed and I agree with radiologist reading as below.  DG Foot Complete Right  Result Date: 09/05/2021 CLINICAL DATA:  Gangrene of right fifth digit EXAM: RIGHT FOOT COMPLETE - 3+ VIEW COMPARISON:  None. FINDINGS: Bony erosion of the distal phalanx of the right fifth digit. Prior amputation of the distal phalanx of the right great toe. No acute fracture or dislocation. 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 Diffuse soft tissue edema about the included foot and ankle. IMPRESSION: 1. Bony erosion of the  distal phalanx of the right fifth digit, consistent with osteomyelitis. 2. Prior amputation of the distal phalanx of the right great toe. 3. No acute fracture or dislocation. 4. 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 5. Diffuse soft tissue edema about the included foot and ankle. Electronically Signed   By: Delanna Ahmadi M.D.   On: 09/05/2021 19:54    Labs on Admission: I have personally reviewed following labs  CBC: Recent Labs  Lab 09/05/21 1738  WBC 9.6  NEUTROABS 6.2  HGB 12.5  HCT 38.1  MCV 89.9  PLT 123456   Basic Metabolic Panel: Recent Labs  Lab 09/05/21 1738  NA 132*  K 3.9  CL 102  CO2 22  GLUCOSE 442*  BUN 12  CREATININE 1.02*  CALCIUM 8.2*   GFR: Estimated Creatinine Clearance: 108.4 mL/min (A) (by C-G  formula based on SCr of 1.02 mg/dL (H)).  Liver Function Tests: Recent Labs  Lab 09/05/21 1738  AST 18  ALT 16  ALKPHOS 95  BILITOT 0.6  PROT 7.0  ALBUMIN 3.6   Urine analysis:    Component Value Date/Time   COLORURINE AMBER (A) 08/09/2016 1301   APPEARANCEUR HAZY (A) 08/09/2016 1301   APPEARANCEUR CLEAR 08/18/2012 1504   LABSPEC 1.035 (H) 08/09/2016 1301   LABSPEC 1.020 08/18/2012 1504   PHURINE 5.0 08/09/2016 1301   GLUCOSEU NEGATIVE 08/09/2016 1301   GLUCOSEU >/= 2000 mg/dL 08/18/2012 1504   HGBUR MODERATE (A) 08/09/2016 1301   BILIRUBINUR SMALL (A) 08/09/2016 1301   BILIRUBINUR NEGATIVE 08/18/2012 1504   KETONESUR 80 (A) 08/09/2016 1301   PROTEINUR 100 (A) 08/09/2016 1301   NITRITE NEGATIVE 08/09/2016 1301   LEUKOCYTESUR TRACE (A) 08/09/2016 1301   LEUKOCYTESUR NEGATIVE 08/18/2012 1504   Dr. Tobie Poet [facility]  If 7PM-7AM, please contact overnight-coverage provider If 7AM-7PM, please contact day coverage provider [email or web address]  09/05/2021, 11:24 PM

## 2021-09-05 NOTE — Hospital Course (Signed)
[name] is a 41 year old female with medical history of insulin-dependent diabetes mellitus type 2, hyperlipidemia, hypertension, morbid obesity, metabolic syndrome, who presents emergency department for chief concerns of right toe wound.  Patient has been following with podiatry outpatient and was told by podiatrist to present to the emergency department on Friday, 09/01/2021 for amputation of her right fifth toe.  Patient did not feel ready to present at that time and presented today to the emergency department.  EDP spoke with Dr. Ether Griffins who agrees that the patient needs to be admitted and plans for OR on 09/06/2021.  Initial vitals in the emergency department showed temperature of 97.7, respiration rate of 16, heart rate of 97, blood pressure 156/85, SPO2 100% on room air.  Labs in the emergency department showed serum sodium 132, potassium 3.9, chloride of 102, bicarb of 22, BUN of 12, serum creatinine of 1.02, nonfasting blood glucose 442, WBC 9.6, hemoglobin 12.5, platelets of 274.  COVID/influenza A/influenza B PCR were negative.  Blood cultures x2 have been collected in the emergency department.

## 2021-09-05 NOTE — Assessment & Plan Note (Signed)
-   Patient takes atorvastatin 20 mg nightly

## 2021-09-05 NOTE — ED Triage Notes (Signed)
Pt to ED via POV with c/o Dr. Excell Seltzer (podiatrist) sent her here to be admitted to get her right 5th digit taken off but her blood sugars are running high so they can not do surgery. She reports that she ate a "going away meal, before coming in so my sugar is high."

## 2021-09-05 NOTE — Consult Note (Signed)
ORTHOPAEDIC CONSULTATION  REQUESTING PHYSICIAN: Georga Hacking, MD  Chief Complaint: Right 5th toe infection  HPI: Laura Escobar is a 41 y.o. female who complains of  worsening infection right 5th toe.  Longstanding hx of dm with foot issue.  Seen in outpt clinic last week and xrays showed osteomyelitis right 5th toe.  Pts presents today to ER.  Outpt HBA1C  13.5.    Past Medical History:  Diagnosis Date   Addison disease (HCC) 2011   Bipolar 1 disorder (HCC) 2011   Charcot foot due to diabetes mellitus (HCC)    Diabetes mellitus without complication (HCC) 1998   Gastroparesis    Hyperlipidemia    Hypertension    Ketoacidosis    Marijuana abuse    PONV (postoperative nausea and vomiting)    Past Surgical History:  Procedure Laterality Date   ABCESS DRAINAGE N/A    Buttocks 2011, right groin 2013   ABDOMINAL HYSTERECTOMY     AMPUTATION TOE Right 06/24/2015   Procedure: AMPUTATION TOE;  Surgeon: Gwyneth Revels, DPM;  Location: [facility] ORS;  Service: Podiatry;  Laterality: Right;   Social History   Socioeconomic History   Marital status: Single    Spouse name: Not on file   Number of children: Not on file   Years of education: Not on file   Highest education level: Not on file  Occupational History   Not on file  Tobacco Use   Smoking status: Former    Packs/day: 0.25    Types: Cigarettes   Smokeless tobacco: Never  Vaping Use   Vaping Use: Never used  Substance and Sexual Activity   Alcohol use: No    Alcohol/week: 0.0 standard drinks    Comment: 1 glasas of wine twice a year   Drug use: Yes    Types: Marijuana    Comment: 3-4 joints 4-5 days a week   Sexual activity: Yes    Comment: NONE  Other Topics Concern   Not on file  Social History Narrative   Not on file   Social Determinants of Health   Financial Resource Strain: Not on file  Food Insecurity: Not on file  Transportation Needs: Not on file  Physical Activity: Not on file  Stress: Not on  file  Social Connections: Not on file   Family History  Problem Relation Age of Onset   Diabetes Mother    Stroke Mother    Healthy Father    Allergies  Allergen Reactions   Metformin And Related    Prior to Admission medications   Medication Sig Start Date End Date Taking? Authorizing Provider  atorvastatin (LIPITOR) 20 MG tablet Take 20 mg by mouth daily. 02/17/21   [provider]  baclofen (LIORESAL) 20 MG tablet Take 1 tablet (20 mg total) by mouth 3 (three) times daily. 02/17/21   Becky Augusta, NP  Cholecalciferol 125 MCG (5000 UT) capsule Take by mouth. 01/20/21   [provider]  cyanocobalamin 1000 MCG tablet Take 2 tablets daily for 2 weeks, then reduce to 1 tablet daily thereafter for Vitamin B12 Deficiency. 01/20/21   [provider]  hydrochlorothiazide (MICROZIDE) 12.5 MG capsule Take 1 capsule by mouth daily. 07/21/19   [provider]  ibuprofen (ADVIL) 600 MG tablet Take 1 tablet (600 mg total) by mouth every 6 (six) hours as needed. 02/17/21   Becky Augusta, NP  insulin aspart protamine - aspart (NOVOLOG MIX 70/30 FLEXPEN) (70-30) 100 UNIT/ML FlexPen Inject 50 Units into the  skin 2 (two) times daily with a meal.  08/22/18   [provider]  losartan (COZAAR) 100 MG tablet Take 100 mg by mouth daily. 12/24/20   [provider]  promethazine (PHENERGAN) 12.5 MG tablet TAKE 1 TABLET BY MOUTH TWICE DAILY AS NEEDED FOR NAUSEA AND FOR VOMITING 12/20/18   [provider]  promethazine (PHENERGAN) 25 MG suppository Place 25 mg rectally as needed for nausea or vomiting.    [provider]   No results found.  Positive ROS: All other systems have been reviewed and were otherwise negative with the exception of those mentioned in the HPI and as above.  12 point ROS was performed.  Physical Exam: General: Alert and oriented.  No apparent distress.  Vascular:  Left foot:Dorsalis Pedis:  present Posterior Tibial:   diminished  Right foot: Dorsalis Pedis:  present Posterior Tibial:  diminished  Diminished pulses secondary to edema. Foot warm to touch.  Neuro:absent  protective sensation  Derm:Necrotic ulcer right 5th toe probes to bone.  No other ulcer left or right foot  Ortho/MS: Diffuse edema right foot.  S/P charcot reconstruction.   Assessment: Osteomyelitis right 5th toe. DM with neuropathy  Plan: I reviewed outpt xray consistent with erosion of distal and middle phalanx.  Will plan for amputation of right 5th toe.  All r/b/a/c d/w pt and consent given.  Pt poorly controlled DM.  Apprectiat IM assitance for medical management.    Irean Hong, DPM Cell [phone]   09/05/2021 7:25 PM

## 2021-09-05 NOTE — ED Provider Notes (Signed)
[facility] Provider Note    Event Date/Time   First MD Initiated Contact with Patient 09/05/21 1718     (approximate)   History   Wound Infection   HPI  Laura Escobar is a 41 y.o. female with past medical history of diabetes that is poorly controlled, history of prior partial amputation of the right first toe, bipolar disorder, Addison's disease, hyperlipidemia who presents with osteomyelitis of her right fifth toe.  Patient tells me that she has had issues with the right fifth digit on her foot since July.  She sees Dr. Excell Seltzer with podiatry.  She had an x-ray done on the 10th that was concerning for osteomyelitis and she was told that she should come to the ER to be admitted because her blood sugar was also poorly controlled.  Patient has been taking Augmentin as an outpatient.  She said she was called again today to come into the emergency department.  She denies any acute complaints.  Has not had pain in the region.  Denies fevers or chills.  She does take insulin, questionable compliance.  Does not check her sugar at home    Past Medical History:  Diagnosis Date   Addison disease (HCC) 2011   Bipolar 1 disorder (HCC) 2011   Charcot foot due to diabetes mellitus (HCC)    Diabetes mellitus without complication (HCC) 1998   Gastroparesis    Hyperlipidemia    Hypertension    Ketoacidosis    Marijuana abuse    PONV (postoperative nausea and vomiting)     Patient Active Problem List   Diagnosis Date Noted   Pressure ulcer 04/16/2015   Cellulitis 04/15/2015   DKA (diabetic ketoacidoses) 03/15/2015   UTI (lower urinary tract infection) 03/15/2015   Gastroparesis 03/15/2015     Physical Exam  Triage Vital Signs: ED Triage Vitals  Enc Vitals Group     BP 09/05/21 1642 (!) 156/85     Pulse Rate 09/05/21 1642 97     Resp 09/05/21 1642 16     Temp 09/05/21 1642 97.7 F (36.5 C)     Temp Source 09/05/21 1642 Oral     SpO2 09/05/21 1642 100 %      Weight 09/05/21 1652 (!) 314 lb (142.4 kg)     Height 09/05/21 1652 5' 7.5" (1.715 m)     Head Circumference --      Peak Flow --      Pain Score --      Pain Loc --      Pain Edu? --      Excl. in GC? --     Most recent vital signs: Vitals:   09/05/21 1730 09/05/21 1917  BP: (!) 158/99 (!) 144/76  Pulse: 98 91  Resp: 16 17  Temp:  97.9 F (36.6 C)  SpO2: 99% 100%     General: Awake, no distress.  CV:  Good peripheral perfusion.  Resp:  Normal effort.  Abd:  No distention.  Neuro:             Awake, Alert, Oriented x 3  Other:  [place]ela of partial amputation of the first toe on the right, skin breakdown on the medial surface of the right fifth digit with granulation tissue exposed, some darkening the toe, no obvious gangrene or cellulitis   ED Results / Procedures / Treatments  Labs (all labs ordered are listed, but only abnormal results are displayed) Labs Reviewed  LACTIC ACID, PLASMA -  Abnormal; Notable for the following components:      Result Value   Lactic Acid, Venous 2.8 (*)    All other components within normal limits  COMPREHENSIVE METABOLIC PANEL - Abnormal; Notable for the following components:   Sodium 132 (*)    Glucose, Bld 442 (*)    Creatinine, Ser 1.02 (*)    Calcium 8.2 (*)    All other components within normal limits  RESP PANEL BY RT-PCR (FLU A&B, COVID) ARPGX2  CULTURE, BLOOD (ROUTINE X 2)  CULTURE, BLOOD (ROUTINE X 2)  CBC WITH DIFFERENTIAL/PLATELET  LACTIC ACID, PLASMA  URINALYSIS, ROUTINE W REFLEX MICROSCOPIC  POC URINE PREG, ED     EKG     RADIOLOGY    PROCEDURES:  Critical Care performed: NoNo  Procedures  The patient is on the cardiac monitor to evaluate for evidence of arrhythmia and/or significant heart rate changes.   MEDICATIONS ORDERED IN ED: Medications  vancomycin (VANCOREADY) IVPB 2000 mg/400 mL (2,000 mg Intravenous New Bag/Given 09/05/21 1911)  piperacillin-tazobactam (ZOSYN) IVPB 3.375 g (3.375 g  Intravenous New Bag/Given 09/05/21 1911)  lactated ringers bolus 1,000 mL (1,000 mLs Intravenous New Bag/Given 09/05/21 1911)     IMPRESSION / MDM / ASSESSMENT AND PLAN / ED COURSE  I reviewed the triage vital signs and the nursing notes.                              Differential diagnosis includes, but is not limited to, osteomyelitis, DKA, HHS, hyperglycemia, sepsis  This patient is a 41 year old female with a history of poorly controlled diabetes who was referred by podiatry to the ER for admission for amputation of the right fifth digit.  She had an x-ray done on the 10th that was concerning for osteomyelitis.  He was told to come to the ER on that day but was not ready at the time.  She has not had any systemic symptoms.  Unknown what her blood sugars been at home she does not check them.  On exam the toe does look infected but not grossly cellulitic necrotic or gangrenous.  I reviewed the x-ray done on the 10th which is consistent with osteomyelitis.  We will plan to get labs antibiotics and likely admit.  Since lactate is elevated 2.8, however she has no leukocytosis.  Her blood sugar is 442 but no evidence of DKA on CMP.  We will cover for osteomyelitis with vancomycin and Zosyn.  Discussed with Dr. Ether Griffins who does prefer the patient be admitted, will go to the OR tomorrow.  Will admit to the hospital service, discussed with the hospitalist for admission.   FINAL CLINICAL IMPRESSION(S) / ED DIAGNOSES   Final diagnoses:  Osteomyelitis of right foot, unspecified type (HCC)     Rx / DC Orders   ED Discharge Orders     None        Note:  This document was prepared using Dragon voice recognition software and may include unintentional dictation errors.   Georga Hacking, MD 09/05/21 1925

## 2021-09-05 NOTE — Assessment & Plan Note (Addendum)
-   Patient endorses readiness to stop tobacco use. - Greater than 3 minutes spent on tobacco cessation counseling - Extensive counseling regarding continued tobacco use decreasing patient's ability to heal  Tobacco cessation counseling:  1. Week one, smoke 19 cigarettes per day. Week two, smoke 18 cigarettes per day. Week three, smoke 17 cigarettes per day, continue until smoking half the amount of cigarettes per day 2. Discuss with PCP for pharmacologic assistance with smoking cessation 3. Clean all indoor clothing, sheets, blankets, and freshen textile furniture to rid the smell of cigarettes 4. Only smoke outside and wear outer covering 5. Leave cigarettes and lighters outside in separate places 6. During in-between cigarettes, if you feel the urge to smoke, use the following: stress squeezing devices/phone a trusted friend to talk you through the urge/walk in a safe environment 7. Call 1800 QUIT NOW if in need of nicotine patches to help with cessation

## 2021-09-05 NOTE — Assessment & Plan Note (Signed)
-   Patient takes hydrochlorothiazide 12.5 mg p.o. daily, losartan 100 mg p.o. daily at home - Hydralazine 10 mg p.o. every 6 hours as needed for SBP greater than 175, 4 days ordered

## 2021-09-06 ENCOUNTER — Inpatient Hospital Stay: Payer: Medicare Other | Admitting: Anesthesiology

## 2021-09-06 ENCOUNTER — Encounter
Admission: EM | Disposition: A | Discharge status: Home / Self Care | Payer: Self-pay | Source: Home / Self Care | Attending: Hospitalist

## 2021-09-06 ENCOUNTER — Encounter: Payer: Self-pay | Admitting: Internal Medicine

## 2021-09-06 DIAGNOSIS — Z794 Long term (current) use of insulin: Secondary | ICD-10-CM | POA: Diagnosis not present

## 2021-09-06 DIAGNOSIS — I10 Essential (primary) hypertension: Secondary | ICD-10-CM | POA: Diagnosis present

## 2021-09-06 DIAGNOSIS — E11621 Type 2 diabetes mellitus with foot ulcer: Secondary | ICD-10-CM | POA: Diagnosis present

## 2021-09-06 DIAGNOSIS — F1210 Cannabis abuse, uncomplicated: Secondary | ICD-10-CM | POA: Diagnosis present

## 2021-09-06 DIAGNOSIS — E8881 Metabolic syndrome: Secondary | ICD-10-CM | POA: Diagnosis present

## 2021-09-06 DIAGNOSIS — Z833 Family history of diabetes mellitus: Secondary | ICD-10-CM | POA: Diagnosis not present

## 2021-09-06 DIAGNOSIS — M869 Osteomyelitis, unspecified: Secondary | ICD-10-CM | POA: Diagnosis not present

## 2021-09-06 DIAGNOSIS — E1169 Type 2 diabetes mellitus with other specified complication: Secondary | ICD-10-CM | POA: Diagnosis present

## 2021-09-06 DIAGNOSIS — E1165 Type 2 diabetes mellitus with hyperglycemia: Secondary | ICD-10-CM | POA: Diagnosis present

## 2021-09-06 DIAGNOSIS — F17210 Nicotine dependence, cigarettes, uncomplicated: Secondary | ICD-10-CM | POA: Diagnosis present

## 2021-09-06 DIAGNOSIS — K3184 Gastroparesis: Secondary | ICD-10-CM | POA: Diagnosis present

## 2021-09-06 DIAGNOSIS — L97514 Non-pressure chronic ulcer of other part of right foot with necrosis of bone: Secondary | ICD-10-CM | POA: Diagnosis present

## 2021-09-06 DIAGNOSIS — M86171 Other acute osteomyelitis, right ankle and foot: Secondary | ICD-10-CM | POA: Diagnosis present

## 2021-09-06 DIAGNOSIS — Z79899 Other long term (current) drug therapy: Secondary | ICD-10-CM | POA: Diagnosis not present

## 2021-09-06 DIAGNOSIS — F319 Bipolar disorder, unspecified: Secondary | ICD-10-CM | POA: Diagnosis present

## 2021-09-06 DIAGNOSIS — Z20822 Contact with and (suspected) exposure to covid-19: Secondary | ICD-10-CM | POA: Diagnosis present

## 2021-09-06 DIAGNOSIS — M19071 Primary osteoarthritis, right ankle and foot: Secondary | ICD-10-CM | POA: Diagnosis present

## 2021-09-06 DIAGNOSIS — E785 Hyperlipidemia, unspecified: Secondary | ICD-10-CM | POA: Diagnosis present

## 2021-09-06 DIAGNOSIS — Z28310 Unvaccinated for covid-19: Secondary | ICD-10-CM | POA: Diagnosis not present

## 2021-09-06 DIAGNOSIS — E11610 Type 2 diabetes mellitus with diabetic neuropathic arthropathy: Secondary | ICD-10-CM | POA: Diagnosis present

## 2021-09-06 DIAGNOSIS — Z6841 Body Mass Index (BMI) 40.0 and over, adult: Secondary | ICD-10-CM | POA: Diagnosis not present

## 2021-09-06 DIAGNOSIS — Z823 Family history of stroke: Secondary | ICD-10-CM | POA: Diagnosis not present

## 2021-09-06 DIAGNOSIS — E1143 Type 2 diabetes mellitus with diabetic autonomic (poly)neuropathy: Secondary | ICD-10-CM | POA: Diagnosis present

## 2021-09-06 DIAGNOSIS — E271 Primary adrenocortical insufficiency: Secondary | ICD-10-CM | POA: Diagnosis present

## 2021-09-06 HISTORY — PX: AMPUTATION TOE: SHX6595

## 2021-09-06 LAB — BASIC METABOLIC PANEL
Anion gap: 7 (ref 5–15)
BUN: 17 mg/dL (ref 6–20)
CO2: 23 mmol/L (ref 22–32)
Calcium: 8.1 mg/dL — ABNORMAL LOW (ref 8.9–10.3)
Chloride: 103 mmol/L (ref 98–111)
Creatinine, Ser: 1.07 mg/dL — ABNORMAL HIGH (ref 0.44–1.00)
GFR, Estimated: >60 mL/min (ref >60)
Glucose, Bld: 273 mg/dL — ABNORMAL HIGH (ref 70–99)
Potassium: 3.7 mmol/L (ref 3.5–5.1)
Sodium: 133 mmol/L — ABNORMAL LOW (ref 135–145)

## 2021-09-06 LAB — GLUCOSE, CAPILLARY
Glucose-Capillary: 291 mg/dL — ABNORMAL HIGH (ref 70–99)
Glucose-Capillary: 211 mg/dL — ABNORMAL HIGH (ref 70–99)
Glucose-Capillary: 182 mg/dL — ABNORMAL HIGH (ref 70–99)
Glucose-Capillary: 205 mg/dL — ABNORMAL HIGH (ref 70–99)

## 2021-09-06 LAB — URINALYSIS, ROUTINE W REFLEX MICROSCOPIC
Bacteria, UA: NONE SEEN
Bilirubin Urine: NEGATIVE
Glucose, UA: >1000 mg/dL — AB
Hgb urine dipstick: NEGATIVE
Leukocytes,Ua: NEGATIVE
Nitrite: NEGATIVE
Protein, ur: NEGATIVE mg/dL
Specific Gravity, Urine: 1.015 (ref 1.005–1.030)
pH: 5.5 (ref 5.0–8.0)

## 2021-09-06 LAB — CBC
HCT: 34.9 % — ABNORMAL LOW (ref 36.0–46.0)
Hemoglobin: 11.6 g/dL — ABNORMAL LOW (ref 12.0–15.0)
MCH: 29.5 pg (ref 26.0–34.0)
MCHC: 33.2 g/dL (ref 30.0–36.0)
MCV: 88.8 fL (ref 80.0–100.0)
Platelets: 196 10*3/uL (ref 150–400)
RBC: 3.93 MIL/uL (ref 3.87–5.11)
RDW: 11.9 % (ref 11.5–15.5)
WBC: 8.1 10*3/uL (ref 4.0–10.5)
nRBC: 0 % (ref 0.0–0.2)

## 2021-09-06 LAB — HEMOGLOBIN A1C
Hgb A1c MFr Bld: 11.4 % — ABNORMAL HIGH (ref 4.8–5.6)
Mean Plasma Glucose: 280.48 mg/dL

## 2021-09-06 LAB — PREGNANCY, URINE: Preg Test, Ur: NEGATIVE

## 2021-09-06 LAB — HIV ANTIBODY (ROUTINE TESTING W REFLEX): HIV Screen 4th Generation wRfx: NONREACTIVE

## 2021-09-06 SURGERY — AMPUTATION, TOE
Anesthesia: General | Site: Toe | Laterality: Right

## 2021-09-06 MED ORDER — BUPIVACAINE HCL 0.5 % IJ SOLN
INTRAMUSCULAR | Status: DC | PRN
  Administered 2021-09-06: 10 mL

## 2021-09-06 MED ORDER — PROMETHAZINE HCL 25 MG PO TABS
12.5000 mg | ORAL_TABLET | Freq: Once | ORAL | Status: AC
  Administered 2021-09-06: 12.5 mg via ORAL
  Filled 2021-09-06: qty 1

## 2021-09-06 MED ORDER — 0.9 % SODIUM CHLORIDE (POUR BTL) OPTIME
TOPICAL | Status: DC | PRN
  Administered 2021-09-06: 200 mL

## 2021-09-06 MED ORDER — VANCOMYCIN HCL 1500 MG/300ML IV SOLN
1500.0000 mg | Freq: Two times a day (BID) | INTRAVENOUS | Status: DC
  Administered 2021-09-07: 1500 mg via INTRAVENOUS
  Filled 2021-09-06 (×2): qty 300

## 2021-09-06 MED ORDER — LOSARTAN POTASSIUM 50 MG PO TABS
100.0000 mg | ORAL_TABLET | Freq: Every day | ORAL | Status: DC
  Administered 2021-09-06: 100 mg via ORAL
  Filled 2021-09-06: qty 2

## 2021-09-06 MED ORDER — SCOPOLAMINE 1 MG/3DAYS TD PT72
MEDICATED_PATCH | TRANSDERMAL | Status: AC
  Administered 2021-09-06: 1.5 mg
  Filled 2021-09-06: qty 1

## 2021-09-06 MED ORDER — EPHEDRINE SULFATE (PRESSORS) 50 MG/ML IJ SOLN
INTRAMUSCULAR | Status: DC | PRN
Start: 2021-09-06 — End: 2021-09-06
  Administered 2021-09-06: 10 mg via INTRAVENOUS

## 2021-09-06 MED ORDER — SODIUM CHLORIDE 0.9 % IV SOLN: INTRAVENOUS | Status: DC

## 2021-09-06 MED ORDER — PIPERACILLIN-TAZOBACTAM 3.375 G IVPB
3.3750 g | Freq: Three times a day (TID) | INTRAVENOUS | Status: DC
  Administered 2021-09-06 (×2): 3.375 g via INTRAVENOUS
  Filled 2021-09-06 (×2): qty 50

## 2021-09-06 MED ORDER — VANCOMYCIN HCL IN DEXTROSE 1-5 GM/200ML-% IV SOLN
1000.0000 mg | Freq: Once | INTRAVENOUS | Status: AC
  Administered 2021-09-06: 1000 mg via INTRAVENOUS
  Filled 2021-09-06: qty 200

## 2021-09-06 MED ORDER — AMLODIPINE BESYLATE 5 MG PO TABS: 5.0000 mg | ORAL_TABLET | Freq: Every day | ORAL | Status: DC

## 2021-09-06 MED ORDER — INSULIN ASPART PROT & ASPART (70-30 MIX) 100 UNIT/ML PEN: 25.0000 [IU] | PEN_INJECTOR | Freq: Two times a day (BID) | SUBCUTANEOUS | Status: DC

## 2021-09-06 MED ORDER — PROPOFOL 500 MG/50ML IV EMUL
INTRAVENOUS | Status: AC
  Filled 2021-09-06: qty 50

## 2021-09-06 MED ORDER — SCOPOLAMINE 1 MG/3DAYS TD PT72: 1.0000 | MEDICATED_PATCH | TRANSDERMAL | Status: DC

## 2021-09-06 MED ORDER — FENTANYL CITRATE (PF) 100 MCG/2ML IJ SOLN: 25.0000 ug | INTRAMUSCULAR | Status: DC | PRN

## 2021-09-06 MED ORDER — PROPOFOL 500 MG/50ML IV EMUL
INTRAVENOUS | Status: DC | PRN
  Administered 2021-09-06: 200 ug/kg/min via INTRAVENOUS

## 2021-09-06 MED ORDER — MIDAZOLAM HCL 2 MG/2ML IJ SOLN
INTRAMUSCULAR | Status: AC
  Filled 2021-09-06: qty 2

## 2021-09-06 MED ORDER — VANCOMYCIN HCL 1500 MG/300ML IV SOLN
1500.0000 mg | Freq: Once | INTRAVENOUS | Status: AC
  Administered 2021-09-06: 1500 mg via INTRAVENOUS
  Filled 2021-09-06: qty 300

## 2021-09-06 MED ORDER — INSULIN ASPART PROT & ASPART (70-30 MIX) 100 UNIT/ML ~~LOC~~ SUSP
25.0000 [IU] | Freq: Two times a day (BID) | SUBCUTANEOUS | Status: DC
  Administered 2021-09-06 – 2021-09-07 (×2): 25 [IU] via SUBCUTANEOUS
  Filled 2021-09-06 (×2): qty 10

## 2021-09-06 MED ORDER — ENOXAPARIN SODIUM 80 MG/0.8ML IJ SOSY
0.5000 mg/kg | PREFILLED_SYRINGE | INTRAMUSCULAR | Status: DC
  Administered 2021-09-07: 70 mg via SUBCUTANEOUS
  Filled 2021-09-06: qty 0.7

## 2021-09-06 MED ORDER — BUPIVACAINE HCL (PF) 0.5 % IJ SOLN
INTRAMUSCULAR | Status: AC
  Filled 2021-09-06: qty 30

## 2021-09-06 MED ORDER — CHLORHEXIDINE GLUCONATE 4 % EX LIQD: 60.0000 mL | Freq: Once | CUTANEOUS | Status: DC

## 2021-09-06 MED ORDER — LIDOCAINE HCL (PF) 1 % IJ SOLN
INTRAMUSCULAR | Status: AC
  Filled 2021-09-06: qty 30

## 2021-09-06 MED ORDER — MUPIROCIN 2 % EX OINT
1.0000 "application " | TOPICAL_OINTMENT | Freq: Two times a day (BID) | CUTANEOUS | Status: DC
  Administered 2021-09-06 – 2021-09-07 (×2): 1 via NASAL
  Filled 2021-09-06 (×3): qty 22

## 2021-09-06 MED ORDER — POVIDONE-IODINE 10 % EX SWAB: 2.0000 "application " | Freq: Once | CUTANEOUS | Status: DC

## 2021-09-06 MED ORDER — MIDAZOLAM HCL 2 MG/2ML IJ SOLN
INTRAMUSCULAR | Status: DC | PRN
  Administered 2021-09-06: 2 mg via INTRAVENOUS

## 2021-09-06 MED ORDER — PROMETHAZINE HCL 25 MG/ML IJ SOLN: 6.2500 mg | INTRAMUSCULAR | Status: DC | PRN

## 2021-09-06 MED ORDER — SODIUM CHLORIDE 0.9 % IV SOLN
2.0000 g | Freq: Three times a day (TID) | INTRAVENOUS | Status: DC
  Administered 2021-09-06 – 2021-09-07 (×2): 2 g via INTRAVENOUS
  Filled 2021-09-06 (×4): qty 2

## 2021-09-06 MED ORDER — ONDANSETRON HCL 4 MG/2ML IJ SOLN
INTRAMUSCULAR | Status: DC | PRN
  Administered 2021-09-06: 4 mg via INTRAVENOUS

## 2021-09-06 SURGICAL SUPPLY — 31 items
BLADE SURG MINI STRL (BLADE) ×2 IMPLANT
BNDG CONFORM 3 STRL LF (GAUZE/BANDAGES/DRESSINGS) ×3 IMPLANT
BNDG ELASTIC 3X5.8 VLCR STR LF (GAUZE/BANDAGES/DRESSINGS) ×1 IMPLANT
BNDG ELASTIC 4X5.8 VLCR NS LF (GAUZE/BANDAGES/DRESSINGS) ×2 IMPLANT
BNDG GAUZE ELAST 4 BULKY (GAUZE/BANDAGES/DRESSINGS) ×2 IMPLANT
DURAPREP 26ML APPLICATOR (WOUND CARE) ×2 IMPLANT
ELECT REM PT RETURN 9FT ADLT (ELECTROSURGICAL) ×2
ELECTRODE REM PT RTRN 9FT ADLT (ELECTROSURGICAL) ×1 IMPLANT
GAUZE 4X4 16PLY ~~LOC~~+RFID DBL (SPONGE) ×2 IMPLANT
GAUZE SPONGE 4X4 12PLY STRL (GAUZE/BANDAGES/DRESSINGS) ×2 IMPLANT
GAUZE XEROFORM 1X8 LF (GAUZE/BANDAGES/DRESSINGS) ×2 IMPLANT
GLOVE SURG ENC MOIS LTX SZ7.5 (GLOVE) ×2 IMPLANT
GLOVE SURG UNDER LTX SZ8 (GLOVE) ×2 IMPLANT
GOWN STRL REUS W/ TWL XL LVL3 (GOWN DISPOSABLE) ×2 IMPLANT
GOWN STRL REUS W/TWL XL LVL3 (GOWN DISPOSABLE) ×2
KIT TURNOVER KIT A (KITS) ×2 IMPLANT
LABEL OR SOLS (LABEL) ×2 IMPLANT
MANIFOLD NEPTUNE II (INSTRUMENTS) ×2 IMPLANT
NDL FILTER BLUNT 18X1 1/2 (NEEDLE) ×1 IMPLANT
NDL HYPO 25X1 1.5 SAFETY (NEEDLE) ×1 IMPLANT
NEEDLE FILTER BLUNT 18X 1/2SAF (NEEDLE) ×1
NEEDLE FILTER BLUNT 18X1 1/2 (NEEDLE) ×1 IMPLANT
NEEDLE HYPO 25X1 1.5 SAFETY (NEEDLE) ×2 IMPLANT
NS IRRIG 500ML POUR BTL (IV SOLUTION) ×2 IMPLANT
PACK EXTREMITY ARMC (MISCELLANEOUS) ×2 IMPLANT
PAD ABD DERMACEA PRESS 5X9 (GAUZE/BANDAGES/DRESSINGS) ×3 IMPLANT
STOCKINETTE M/LG 89821 (MISCELLANEOUS) ×2 IMPLANT
STRAP SAFETY 5IN WIDE (MISCELLANEOUS) ×2 IMPLANT
SUT ETHILON 3-0 (SUTURE) ×1 IMPLANT
SYR 10ML LL (SYRINGE) ×6 IMPLANT
WATER STERILE IRR 500ML POUR (IV SOLUTION) ×2 IMPLANT

## 2021-09-06 NOTE — Progress Notes (Signed)
Pharmacy Antibiotic Note  Laura Escobar is a 41 y.o. female admitted on 09/05/2021 with  wound infection .  Pharmacy has been consulted for Zosyn dosing.  Plan: Zosyn 3.375 gm IV X 1 over 30 min given on 1/17 @ 1911. Zosyn 3.375 gm IV Q8H EI ordered to continue on 1/18 @ 0100.   Height: 5' 7.5" (171.5 cm) Weight: (!) 142.4 kg (314 lb) IBW/kg (Calculated) : 62.75  Temp (24hrs), Avg:97.9 F (36.6 C), Min:97.7 F (36.5 C), Max:98 F (36.7 C)  Recent Labs  Lab 09/05/21 1738 09/05/21 2154  WBC 9.6  --   CREATININE 1.02*  --   LATICACIDVEN 2.8* 2.6*    Estimated Creatinine Clearance: 108.4 mL/min (A) (by C-G formula based on SCr of 1.02 mg/dL (H)).    Allergies  Allergen Reactions   Metformin And Related     Antimicrobials this admission:   >>    >>   Dose adjustments this admission:   Microbiology results:  BCx:   UCx:    Sputum:    MRSA PCR:   Thank you for allowing pharmacy to be a part of this patients care.  Odis Turck D 09/06/2021 12:06 AM

## 2021-09-06 NOTE — Progress Notes (Signed)
Anticoagulation monitoring(Lovenox):  40yo  F ordered Lovenox 40 mg Q24h    Filed Weights   09/05/21 1652  Weight: (!) 142.4 kg (314 lb)   BMI 48.4   Lab Results  Component Value Date   CREATININE 1.07 (H) 09/06/2021   CREATININE 1.02 (H) 09/05/2021   CREATININE 1.02 (H) 08/09/2016   Estimated Creatinine Clearance: 103.3 mL/min (A) (by C-G formula based on SCr of 1.07 mg/dL (H)). Hemoglobin & Hematocrit     Component Value Date/Time   HGB 11.6 (L) 09/06/2021 0619   HCT 34.9 (L) 09/06/2021 [id number]     Per Protocol for Patient with estCrcl >30 ml/min and BMI >40, will transition to Lovenox 0.5 mg/kg Q24h      Chinita Greenland PharmD Clinical Pharmacist 09/06/2021

## 2021-09-06 NOTE — Progress Notes (Signed)
Pharmacy Antibiotic Note  Laura Escobar is a 41 y.o. female admitted on 09/05/2021 with  wound infection c/b osteomyelitis .  Pharmacy has been consulted for transition of zosyn to Vancomycin & Cefepime dosing.  Plan: Last Zosyn dose was 1/18 ~0830. Initiate Cefepime 2g IV q8h first dose now. Vancomycin last dose was 2g IV x1 on 1/17 ~1900. Will need to reload. Vancomycin 2.5g loading dose (1g+1.5g); then  Vancomycin 1.5g IV q12h (starting 1/19 ~0800) Goal AUC 400-550  Est AUC: 467.3; Cmax: 31.9; Cmin: 12.4 SCr 1.07; AdjBW (BMI >>40); Vd 0.5  F/u de-escalation as appropriate surgical MRSA PCR negative & cultures pending NGTD.   Height: 5' 7.5" (171.5 cm) Weight: (!) 142.4 kg (314 lb) IBW/kg (Calculated) : 62.75  Temp (24hrs), Avg:97.9 F (36.6 C), Min:97.5 F (36.4 C), Max:98.5 F (36.9 C)  Recent Labs  Lab 09/05/21 1738 09/05/21 2154 09/06/21 0619  WBC 9.6  --  8.1  CREATININE 1.02*  --  1.07*  LATICACIDVEN 2.8* 2.6*  --      Estimated Creatinine Clearance: 103.3 mL/min (A) (by C-G formula based on SCr of 1.07 mg/dL (H)).    Allergies  Allergen Reactions   Metformin And Related     Antimicrobials this admission:   + Zosyn (1/17-18); then CFP (1/18>>    + Vanc x1 (1/17); then (1/18>>  Dose adjustments this admission: CTM and adjust PRN  Microbiology results: 1/18 Surgical Deep wound (amputation/digit): sent   1/17 BCx: NGTD  1/17 MRSA PCR: negative  1/17 Cov/Flu - negative  Thank you for allowing pharmacy to be a part of this patients care.  Lorna Dibble, PharmD, [facility] Clinical Pharmacist 09/06/2021 6:04 PM

## 2021-09-06 NOTE — Plan of Care (Signed)

## 2021-09-06 NOTE — Anesthesia Preprocedure Evaluation (Signed)
Anesthesia Evaluation  Patient identified by MRN, date of birth, ID band Patient awake    Reviewed: Allergy & Precautions, NPO status , Patient's Chart, lab work & pertinent test results  History of Anesthesia Complications (+) PONV and history of anesthetic complications  Airway Mallampati: III  TM Distance: >3 FB Neck ROM: full    Dental  (+) Chipped   Pulmonary neg shortness of breath, former smoker,    Pulmonary exam normal        Cardiovascular hypertension, (-) angina(-) Past MI Normal cardiovascular exam     Neuro/Psych PSYCHIATRIC DISORDERS  Neuromuscular disease    GI/Hepatic negative GI ROS, Neg liver ROS, neg GERD  ,  Endo/Other  diabetes, Type 2, Insulin Dependent  Renal/GU negative Renal ROS  negative genitourinary   Musculoskeletal   Abdominal   Peds  Hematology negative hematology ROS (+)   Anesthesia Other Findings Past Medical History: 2011: Addison disease ([place]) 2011: Bipolar 1 disorder ([place]) No date: Charcot foot due to diabetes mellitus ([place]) 1998: Diabetes mellitus without complication ([place]) No date: Gastroparesis No date: Hyperlipidemia No date: Hypertension No date: Ketoacidosis No date: Marijuana abuse No date: PONV (postoperative nausea and vomiting)  Past Surgical History: No date: ABCESS DRAINAGE; N/A     Comment:  Buttocks 2011, right groin 2013 No date: ABDOMINAL HYSTERECTOMY 06/24/2015: AMPUTATION TOE; Right     Comment:  Procedure: AMPUTATION TOE;  Surgeon: Samara Deist, DPM;              Location: [facility] ORS;  Service: Podiatry;  Laterality:               Right;  BMI    Body Mass Index: 48.45 kg/m      Reproductive/Obstetrics negative OB ROS                             Anesthesia Physical Anesthesia Plan  ASA: 3  Anesthesia Plan: General   Post-op Pain Management:    Induction: Intravenous  PONV Risk Score and Plan: Propofol infusion,  TIVA and Scopolamine patch - Pre-op  Airway Management Planned: Natural Airway and Nasal Cannula  Additional Equipment:   Intra-op Plan:   Post-operative Plan:   Informed Consent: I have reviewed the patients History and Physical, chart, labs and discussed the procedure including the risks, benefits and alternatives for the proposed anesthesia with the patient or authorized representative who has indicated his/her understanding and acceptance.     Dental Advisory Given  Plan Discussed with: Anesthesiologist, CRNA and Surgeon  Anesthesia Plan Comments: (Patient consented for risks of anesthesia including but not limited to:  - adverse reactions to medications - risk of airway placement if required - damage to eyes, teeth, lips or other oral mucosa - nerve damage due to positioning  - sore throat or hoarseness - Damage to heart, brain, nerves, lungs, other parts of body or loss of life  Patient voiced understanding.)        Anesthesia Quick Evaluation

## 2021-09-06 NOTE — Plan of Care (Signed)
Patient arrived to room 149 in stable condition. Aox4. Denies pain at this time. Plan of care reviewed with patient. Surgery planned for tomorrow. Room/unit orientation completed. Call bell within reach.  PLAN OF CARE ONGOING Problem: Education: Goal: Knowledge of General Education information will improve Description: Including pain rating scale, medication(s)/side effects and non-pharmacologic comfort measures Outcome: Progressing   Problem: Health Behavior/Discharge Planning: Goal: Ability to manage health-related needs will improve Outcome: Progressing   Problem: Clinical Measurements: Goal: Ability to maintain clinical measurements within normal limits will improve Outcome: Progressing Goal: Will remain free from infection Outcome: Progressing Goal: Diagnostic test results will improve Outcome: Progressing Goal: Respiratory complications will improve Outcome: Progressing Goal: Cardiovascular complication will be avoided Outcome: Progressing   Problem: Activity: Goal: Risk for activity intolerance will decrease Outcome: Progressing   Problem: Nutrition: Goal: Adequate nutrition will be maintained Outcome: Progressing   Problem: Coping: Goal: Level of anxiety will decrease Outcome: Progressing   Problem: Elimination: Goal: Will not experience complications related to bowel motility Outcome: Progressing Goal: Will not experience complications related to urinary retention Outcome: Progressing   Problem: Pain Managment: Goal: General experience of comfort will improve Outcome: Progressing   Problem: Safety: Goal: Ability to remain free from injury will improve Outcome: Progressing   Problem: Skin Integrity: Goal: Risk for impaired skin integrity will decrease Outcome: Progressing

## 2021-09-06 NOTE — TOC Progression Note (Signed)
Transition of Care [facility]) - Progression Note    Patient Details  Name: Laura Escobar MRN: [id number] Date of Birth: September 03, 1980  Transition of [facility]) CM/SW [place], RN Phone Number: 09/06/2021, 10:45 AM  Clinical Narrative:    Patient comes in to have a Toe Amputation, From Home, Surgery Planned A1C is 13.5, Unable to do surgery yet due to high BS. TOC to monitor for needs and assist with DC        Expected Discharge Plan and Services                                                 Social Determinants of Health (SDOH) Interventions    Readmission Risk Interventions No flowsheet data found.

## 2021-09-06 NOTE — Transfer of Care (Signed)
Immediate Anesthesia Transfer of Care Note  Patient: Laura Escobar  Procedure(s) Performed: AMPUTATION 5th TOE (Right: Toe)  Patient Location: PACU  Anesthesia Type:General  Level of Consciousness: sedated  Airway & Oxygen Therapy: Patient Spontanous Breathing and Patient connected to face mask oxygen  Post-op Assessment: Report given to RN and Post -op Vital signs reviewed and stable  Post vital signs: Reviewed and stable  Last Vitals:  Vitals Value Taken Time  BP 136/100 09/06/21 1631  Temp    Pulse 89 09/06/21 1633  Resp 21 09/06/21 1633  SpO2 100 % 09/06/21 1633  Vitals shown include unvalidated device data.  Last Pain:  Vitals:   09/06/21 1438  TempSrc: Oral  PainSc:          Complications: No notable events documented.

## 2021-09-06 NOTE — Op Note (Signed)
Operative note   Surgeon:Callie Facey Armed forces logistics/support/administrative officer: None    Preop diagnosis: Osteomyelitis right fifth toe    Postop diagnosis: Same    Procedure: Amputation right fifth toe MTPJ    EBL: Minimal    Anesthesia:local and IV sedation    Hemostasis: None    Specimen: Osteomyelitis right fifth toe and deep wound culture performed    Complications: None    Operative indications:Jaiden R Gramajo is an 41 y.o. that presents today for surgical intervention.  The risks/benefits/alternatives/complications have been discussed and consent has been given.    Procedure:  Patient was brought into the OR and placed on the operating table in thesupine position. After anesthesia was obtained theright lower extremity was prepped and draped in usual sterile fashion.  Attention was directed to the right fifth toe where at the base at the webspace 2 full-thickness flaps were created.  The toe was disarticulated at the metatarsophalangeal joint.  The toe was sent for pathological examination and the proximal margin was inked.  Bleeders were Bovie cauterized as appropriate.  A deep wound culture was taken.  The wound was flushed with copious amounts of irrigation.  Closure was then performed with a 3-0 nylon.    Patient tolerated the procedure and anesthesia well.  Was transported from the OR to the PACU with all vital signs stable and vascular status intact. To be discharged per routine protocol.  Will follow up in approximately 1 week in the outpatient clinic.

## 2021-09-06 NOTE — Progress Notes (Signed)
Inpatient Diabetes Program Recommendations  AACE/ADA: New Consensus Statement on Inpatient Glycemic Control   Target Ranges:  Prepandial:   less than 140 mg/dL      Peak postprandial:   less than 180 mg/dL (1-2 hours)      Critically ill patients:  140 - 180 mg/dL    Latest Reference Range & Units 09/05/21 21:34 09/06/21 08:04  Glucose-Capillary 70 - 99 mg/dL 343 (H) 291 (H)    Review of Glycemic Control  Diabetes history: DM2 Outpatient Diabetes medications: 70/30 50 units BID Current orders for Inpatient glycemic control: Novolog 0-20 units TID with meals, Novolog 0-5 units QHS  Inpatient Diabetes Program Recommendations:    Insulin: Please consider ordering Semglee 15 units BID. Once diet is resumed, may also want to consider ordering Novolog meal coverage insulin.  Thanks, Barnie Alderman, RN, MSN, CDE Diabetes Coordinator Inpatient Diabetes Program [phone] (Team Pager from 8am to 5pm)

## 2021-09-06 NOTE — Anesthesia Procedure Notes (Signed)
Date/Time: 09/06/2021 4:08 PM Performed by: Nelda Marseille, CRNA Pre-anesthesia Checklist: Patient identified, Emergency Drugs available, Suction available, Patient being monitored and Timeout performed Oxygen Delivery Method: Simple face mask

## 2021-09-06 NOTE — Progress Notes (Signed)
PROGRESS NOTE    Laura Escobar  [id number] DOB: [birth year] DOA: 09/05/2021 PCP: Latanya Maudlin, NP  149A/149A-AA   Assessment & Plan:   Principal Problem:   Osteomyelitis ([place]) Active Problems:   Gastroparesis   Insulin dependent type 2 diabetes mellitus ([place])   Essential hypertension   Hyperlipidemia   Obesity, Class III, BMI 40-49.9 (morbid obesity) ([place])   Tobacco abuse   Toe osteomyelitis, right ([place])   Laura Escobar is a 41 year old female with medical history of insulin-dependent diabetes mellitus type 2, hyperlipidemia, hypertension, morbid obesity, metabolic syndrome, who presents emergency department for chief concerns of right toe wound.   Musculoskeletal and Integument * Osteomyelitis (HCC) of right 5th toe Assessment & Plan - Suspect osteomyelitis of the right fifth toe --OR for amputation today --resume vanc/cefepime after surgery   Cardiovascular and Mediastinum Essential hypertension Assessment & Plan --resume home amlodipine and losartan   Endocrine Insulin dependent type 2 diabetes mellitus ([place]), poorly controlled Assessment & Plan - A1c 11.4 --resume home 70/30 at reduced 25u BID --SSI TID  Other Tobacco abuse Assessment & Plan - Patient endorses readiness to stop tobacco use. - Greater than 3 minutes spent on tobacco cessation counseling by admitting provider   Obesity, Class III, BMI 40-49.9 (morbid obesity) (HCC) Assessment & Plan - Recommend outpatient healthy and safe weight loss with diet and exercise via extensive discussion with PCP and/or referral to weight loss clinic   Hyperlipidemia Assessment & Plan - resume statin after discharge   DVT prophylaxis: Lovenox SQ Code Status: Full code  Family Communication:  Level of care: Med-Surg Dispo:   The patient is from: home Anticipated d/c is to: home Anticipated d/c date is: 1-2 days Patient currently is not medically ready to d/c due to: just  post-op   Subjective and Interval History:  Pt went for AMPUTATION 5th TOE today.  Tolerated it well.   Objective: Vitals:   09/06/21 1645 09/06/21 1700 09/06/21 1704 09/06/21 1719  BP: 139/74 (!) 156/83  (!) 157/87  Pulse: 96 93 94 98  Resp: (!) 21 20 19 16   Temp:  97.7 F (36.5 C)  (!) 97.5 F (36.4 C)  TempSrc:      SpO2: 100% 100% 100% 100%  Weight:      Height:        Intake/Output Summary (Last 24 hours) at 09/06/2021 1945 Last data filed at 09/06/2021 1840 Gross per 24 hour  Intake 626.27 ml  Output 400 ml  Net 226.27 ml   Filed Weights   09/05/21 1652  Weight: (!) 142.4 kg    Examination:   Constitutional: NAD, AAOx3 HEENT: conjunctivae and lids normal, EOMI CV: No cyanosis.   RESP: normal respiratory effort, on RA Extremities: right foot wrapped SKIN: warm, dry Neuro: II - XII grossly intact.   Psych: Normal mood and affect.  Appropriate judgement and reason   Data Reviewed: I have personally reviewed following labs and imaging studies  CBC: Recent Labs  Lab 09/05/21 1738 09/06/21 0619  WBC 9.6 8.1  NEUTROABS 6.2  --   HGB 12.5 11.6*  HCT 38.1 34.9*  MCV 89.9 88.8  PLT [id number]   Basic Metabolic Panel: Recent Labs  Lab 09/05/21 1738 09/06/21 0619  NA 132* 133*  K 3.9 3.7  CL 102 103  CO2 22 23  GLUCOSE 442* 273*  BUN 12 17  CREATININE 1.02* 1.07*  CALCIUM 8.2* 8.1*   GFR: Estimated Creatinine Clearance: 103.3 mL/min (A) (by C-G  formula based on SCr of 1.07 mg/dL (H)). Liver Function Tests: Recent Labs  Lab 09/05/21 1738  AST 18  ALT 16  ALKPHOS 95  BILITOT 0.6  PROT 7.0  ALBUMIN 3.6   No results for input(s): LIPASE, AMYLASE in the last 168 hours. No results for input(s): AMMONIA in the last 168 hours. Coagulation Profile: No results for input(s): INR, PROTIME in the last 168 hours. Cardiac Enzymes: No results for input(s): CKTOTAL, CKMB, CKMBINDEX, TROPONINI in the last 168 hours. BNP (last 3 results) No results for  input(s): PROBNP in the last 8760 hours. HbA1C: Recent Labs    09/06/21 0619  HGBA1C 11.4*   CBG: Recent Labs  Lab 09/05/21 2134 09/06/21 0804 09/06/21 1149 09/06/21 1434 09/06/21 1639  GLUCAP 343* 291* 211* 182* 205*   Lipid Profile: No results for input(s): CHOL, HDL, LDLCALC, TRIG, CHOLHDL, LDLDIRECT in the last 72 hours. Thyroid Function Tests: No results for input(s): TSH, T4TOTAL, FREET4, T3FREE, THYROIDAB in the last 72 hours. Anemia Panel: No results for input(s): VITAMINB12, FOLATE, FERRITIN, TIBC, IRON, RETICCTPCT in the last 72 hours. Sepsis Labs: Recent Labs  Lab 09/05/21 1738 09/05/21 2154  LATICACIDVEN 2.8* 2.6*    Recent Results (from the past 240 hour(s))  Blood culture (routine x 2)     Status: None (Preliminary result)   Collection Time: 09/05/21  4:28 PM   Specimen: BLOOD  Result Value Ref Range Status   Specimen Description BLOOD BLOOD LEFT HAND  Final   Special Requests BOTTLES DRAWN AEROBIC AND ANAEROBIC BCAV  Final   Culture   Final    NO GROWTH < 12 HOURS Performed at [facility], [address] [place], [place] [address]    Report Status PENDING  Incomplete  Blood culture (routine x 2)     Status: None (Preliminary result)   Collection Time: 09/05/21  4:28 PM   Specimen: BLOOD  Result Value Ref Range Status   Specimen Description BLOOD RIGHT ANTECUBITAL  Final   Special Requests BOTTLES DRAWN AEROBIC AND ANAEROBIC [place]  Final   Culture   Final    NO GROWTH < 12 HOURS Performed at [facility], [address] [place], [place] [address]    Report Status PENDING  Incomplete  Resp Panel by RT-PCR (Flu A&B, Covid) Nasopharyngeal Swab     Status: None   Collection Time: 09/05/21  7:12 PM   Specimen: Nasopharyngeal Swab; Nasopharyngeal(NP) swabs in vial transport medium  Result Value Ref Range Status   SARS Coronavirus 2 by RT PCR NEGATIVE NEGATIVE Final    Comment: (NOTE) SARS-CoV-2 target nucleic acids are NOT  DETECTED.  The SARS-CoV-2 RNA is generally detectable in upper respiratory specimens during the acute phase of infection. The lowest concentration of SARS-CoV-2 viral copies this assay can detect is 138 copies/mL. A negative result does not preclude SARS-Cov-2 infection and should not be used as the sole basis for treatment or other patient management decisions. A negative result may occur with  improper specimen collection/handling, submission of specimen other than nasopharyngeal swab, presence of viral mutation(s) within the areas targeted by this assay, and inadequate number of viral copies(<138 copies/mL). A negative result must be combined with clinical observations, patient history, and epidemiological information. The expected result is Negative.  Fact Sheet for Patients:  EntrepreneurPulse.com.au  Fact Sheet for Healthcare Providers:  IncredibleEmployment.be  This test is no t yet approved or cleared by the [place] FDA and  has been authorized for detection and/or  diagnosis of SARS-CoV-2 by FDA under an Emergency Use Authorization (EUA). This EUA will remain  in effect (meaning this test can be used) for the duration of the COVID-19 declaration under Section 564(b)(1) of the Act, 21 U.S.C.section 360bbb-3(b)(1), unless the authorization is terminated  or revoked sooner.       Influenza A by PCR NEGATIVE NEGATIVE Final   Influenza B by PCR NEGATIVE NEGATIVE Final    Comment: (NOTE) The Xpert Xpress SARS-CoV-2/FLU/RSV plus assay is intended as an aid in the diagnosis of influenza from Nasopharyngeal swab specimens and should not be used as a sole basis for treatment. Nasal washings and aspirates are unacceptable for Xpert Xpress SARS-CoV-2/FLU/RSV testing.  Fact Sheet for Patients: EntrepreneurPulse.com.au  Fact Sheet for Healthcare Providers: IncredibleEmployment.be  This test is not yet  approved or cleared by the [place] FDA and has been authorized for detection and/or diagnosis of SARS-CoV-2 by FDA under an Emergency Use Authorization (EUA). This EUA will remain in effect (meaning this test can be used) for the duration of the COVID-19 declaration under Section 564(b)(1) of the Act, 21 U.S.C. section 360bbb-3(b)(1), unless the authorization is terminated or revoked.  Performed at [facility], [address] [place], [place] [address]   MRSA Next Gen by PCR, Nasal     Status: None   Collection Time: 09/05/21  9:57 PM   Specimen: Nasal Mucosa; Nasal Swab  Result Value Ref Range Status   MRSA by PCR Next Gen NOT DETECTED NOT DETECTED Final    Comment: (NOTE) The GeneXpert MRSA Assay (FDA approved for NASAL specimens only), is one component of a comprehensive MRSA colonization surveillance program. It is not intended to diagnose MRSA infection nor to guide or monitor treatment for MRSA infections. Test performance is not FDA approved in patients less than 79 years old. Performed at [facility], [address], [place], [place] [address]       Radiology Studies: DG Foot Complete Right  Result Date: 09/05/2021 CLINICAL DATA:  Gangrene of right fifth digit EXAM: RIGHT FOOT COMPLETE - 3+ VIEW COMPARISON:  None. FINDINGS: Bony erosion of the distal phalanx of the right fifth digit. Prior amputation of the distal phalanx of the right great toe. No acute fracture or dislocation. 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 Diffuse soft tissue edema about the included foot and ankle. IMPRESSION: 1. Bony erosion of the distal phalanx of the right fifth digit, consistent with osteomyelitis. 2. Prior amputation of the distal phalanx of the right great toe. 3. No acute fracture or dislocation. 4. 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 5. Diffuse soft tissue edema about the included foot and ankle. Electronically Signed   By: Delanna Ahmadi M.D.   On: 09/05/2021 19:54     Scheduled Meds:  insulin aspart  0-20 Units Subcutaneous TID WC   insulin aspart  0-5 Units Subcutaneous QHS   mupirocin ointment  1 application Nasal BID   scopolamine  1 patch Transdermal Q72H   Continuous Infusions:  ceFEPime (MAXIPIME) IV     vancomycin     Followed by   vancomycin 1,500 mg (09/06/21 1831)   [START ON 09/07/2021] vancomycin       LOS: 0 days     Enzo Bi, MD [facility] If 7PM-7AM, please contact night-coverage 09/06/2021, 7:45 PM

## 2021-09-07 ENCOUNTER — Encounter: Payer: Self-pay | Admitting: Podiatry

## 2021-09-07 LAB — BASIC METABOLIC PANEL
Anion gap: 7 (ref 5–15)
BUN: 17 mg/dL (ref 6–20)
CO2: 21 mmol/L — ABNORMAL LOW (ref 22–32)
Calcium: 8 mg/dL — ABNORMAL LOW (ref 8.9–10.3)
Chloride: 105 mmol/L (ref 98–111)
Creatinine, Ser: 1.05 mg/dL — ABNORMAL HIGH (ref 0.44–1.00)
GFR, Estimated: >60 mL/min (ref >60)
Glucose, Bld: 271 mg/dL — ABNORMAL HIGH (ref 70–99)
Potassium: 4.3 mmol/L (ref 3.5–5.1)
Sodium: 133 mmol/L — ABNORMAL LOW (ref 135–145)

## 2021-09-07 LAB — CBC
HCT: 36.6 % (ref 36.0–46.0)
Hemoglobin: 11.8 g/dL — ABNORMAL LOW (ref 12.0–15.0)
MCH: 29.1 pg (ref 26.0–34.0)
MCHC: 32.2 g/dL (ref 30.0–36.0)
MCV: 90.4 fL (ref 80.0–100.0)
Platelets: 235 10*3/uL (ref 150–400)
RBC: 4.05 MIL/uL (ref 3.87–5.11)
RDW: 12 % (ref 11.5–15.5)
WBC: 8.9 10*3/uL (ref 4.0–10.5)
nRBC: 0 % (ref 0.0–0.2)

## 2021-09-07 LAB — GLUCOSE, CAPILLARY
Glucose-Capillary: 173 mg/dL — ABNORMAL HIGH (ref 70–99)
Glucose-Capillary: 255 mg/dL — ABNORMAL HIGH (ref 70–99)
Glucose-Capillary: 345 mg/dL — ABNORMAL HIGH (ref 70–99)

## 2021-09-07 LAB — MAGNESIUM: Magnesium: 1.7 mg/dL (ref 1.7–2.4)

## 2021-09-07 MED ORDER — INSULIN ASPART PROT & ASPART (70-30 MIX) 100 UNIT/ML ~~LOC~~ SUSP
15.0000 [IU] | Freq: Once | SUBCUTANEOUS | Status: AC
  Administered 2021-09-07: 15 [IU] via SUBCUTANEOUS
  Filled 2021-09-07: qty 10

## 2021-09-07 MED ORDER — NOVOLOG MIX 70/30 FLEXPEN (70-30) 100 UNIT/ML ~~LOC~~ SUPN: 75.0000 [IU] | PEN_INJECTOR | Freq: Two times a day (BID) | SUBCUTANEOUS | 2 refills | Status: DC

## 2021-09-07 MED ORDER — INSULIN ASPART PROT & ASPART (70-30 MIX) 100 UNIT/ML ~~LOC~~ SUSP
40.0000 [IU] | Freq: Two times a day (BID) | SUBCUTANEOUS | Status: DC
  Filled 2021-09-07: qty 10

## 2021-09-07 MED ORDER — DOXYCYCLINE HYCLATE 100 MG PO CAPS: 100.0000 mg | ORAL_CAPSULE | Freq: Two times a day (BID) | ORAL | 0 refills | Status: AC

## 2021-09-07 NOTE — Progress Notes (Signed)
Met with the patient in the room to discuss DC plan and needs She has transportation with her dad, she lives with family She has a PCP and will follow up with Podiatry  She can afford her medication She doe snot need [place] She has DME at home No additional needs

## 2021-09-07 NOTE — TOC Progression Note (Addendum)
[facility]) - Progression Note    Patient Details  Name: Laura Escobar MRN: [id number] Date of Birth: [birth year]  Transition of Care [facility]) CM/SW [place], RN Phone Number: 09/07/2021, 9:40 AM  Clinical Narrative:   TOC to follow to assist the patient with needs, Follow-up with podiatry next week.  She prefers to follow-up in [place].       Expected Discharge Plan and Services                                                 Social Determinants of Health (SDOH) Interventions    Readmission Risk Interventions No flowsheet data found.

## 2021-09-07 NOTE — Progress Notes (Signed)
Spoke with pt about A1C 11.4 (average blood glucose 280 over the past 2-3 months time) and explained what an A1C is, basic pathophysiology of DM Type 2, basic home care, basic diabetes diet nutrition principles, importance of checking CBGs and maintaining good CBG control to prevent long-term and short-term complications. Reviewed signs and symptoms of hyperglycemia and hypoglycemia and how to treat hypoglycemia at home. Also reviewed blood sugar goals at home.   Patient states she has been taking 70/30 80 units bid ac breakfast & dinner. States she picked up her Trulicity but did not start it. "I'm afraid of starting diabetes medications. I ended up here in the hospital in DKA." States she reported to her PCP  but has appointment with her endocrinologist Feb 21st. Explained how Trulicity works, but patient does not want to attempt. Patient drinks sugary drinks but trying to decrease. Reviewed risks involved in elevated blood glucose and patient states understanding. Patient shared she is under a lot of stress with her mom in the nursing home and her dad lives with her.  Patient has bag packed and ready for discharge. Encouraged compliance.  Thank you, Billy Fischer. Shann Merrick, RN, MSN, CDE  Diabetes Coordinator Inpatient Glycemic Control Team Team Pager [phone] (8am-5pm) 09/07/2021 12:43 PM

## 2021-09-07 NOTE — Discharge Summary (Signed)
Physician Discharge Summary   Laura Escobar  female DOB: 1980/09/12  [id number]  PCP: Luciana Axe, NP  Admit date: 09/05/2021 Discharge date: 09/07/2021  Admitted From: home Disposition:  home CODE STATUS: Full code  Discharge Instructions     Discharge instructions   Complete by: As directed    Please finish 5 more days of antibiotic doxycycline at home and follow up with podiatry.  Your PCP had recently increased your 70/30 combo insulin to 75 units twice a day with meals.  Please continue that dose and follow up with PCP or endocrinology for further adjustment.  Please check your blood sugar at least once in the early morning to make sure your blood sugar is not too low on the new insulin dose.   Dr. Darlin Priestly - -   Leave dressing on - Keep it clean, dry, and intact until clinic visit   Complete by: As directed         Hospital Course:  For full details, please see H&P, progress notes, consult notes and ancillary notes.  Briefly,  Laura Escobar is a 41 year old female with medical history of insulin-dependent diabetes mellitus type 2, hypertension, morbid obesity, metabolic syndrome, who presented emergency department for chief concerns of right toe wound.   * Osteomyelitis (HCC) of right 5th toe S/p amputation on 09/06/21 --pt received 2 days of broad-spectrum abx and discharged on 5 more days of doxycycline, per podiatry rec. --outpatient f/u with podiatry.  Essential hypertension --cont home amlodipine and losartan   Insulin dependent type 2 diabetes mellitus (HCC), poorly controlled - A1c 11.4 --pt reported PCP had increased her 70/30 insulin to 75 BID, so pt was discharged on this dose and advised to check her BG at least once every morning.   Tobacco abuse - Patient endorses readiness to stop tobacco use. - Greater than 3 minutes spent on tobacco cessation counseling by admitting provider   Obesity, Class III, BMI 40-49.9 (morbid  obesity) (HCC) - Recommend outpatient healthy and safe weight loss with diet and exercise via extensive discussion with PCP and/or referral to weight loss clinic   Hyperlipidemia - resume statin after discharge   Discharge Diagnoses:  Principal Problem:   Osteomyelitis (HCC) Active Problems:   Gastroparesis   Insulin dependent type 2 diabetes mellitus (HCC)   Essential hypertension   Hyperlipidemia   Obesity, Class III, BMI 40-49.9 (morbid obesity) (HCC)   Tobacco abuse   Toe osteomyelitis, right (HCC)   30 Day Unplanned Readmission Risk Score    Flowsheet Row ED to Hosp-Admission (Current) from 09/05/2021 in [facility] (1A)  30 Day Unplanned Readmission Risk Score (%) 8.94 Filed at 09/07/2021 0801       This score is the patient's risk of an unplanned readmission within 30 days of being discharged (0 -100%). The score is based on dignosis, age, lab data, medications, orders, and past utilization.   Low:  0-14.9   Medium: 15-21.9   High: 22-29.9   Extreme: 30 and above         Discharge Instructions:  Allergies as of 09/07/2021       Reactions   Metformin And Related         Medication List     STOP taking these medications    amoxicillin-clavulanate 875-125 MG tablet Commonly known as: AUGMENTIN   baclofen 20 MG tablet Commonly known as: LIORESAL   ibuprofen 600 MG tablet Commonly known as: ADVIL  TAKE these medications    amLODipine 5 MG tablet Commonly known as: NORVASC Take 5 mg by mouth daily.   atorvastatin 20 MG tablet Commonly known as: LIPITOR Take 20 mg by mouth daily.   Cholecalciferol 125 MCG (5000 UT) capsule Take by mouth.   cyanocobalamin 1000 MCG tablet Take 2 tablets daily for 2 weeks, then reduce to 1 tablet daily thereafter for Vitamin B12 Deficiency.   doxycycline 100 MG capsule Commonly known as: VIBRAMYCIN Take 1 capsule (100 mg total) by mouth 2 (two) times daily for 5 days.    losartan 100 MG tablet Commonly known as: COZAAR Take 100 mg by mouth daily.   NovoLOG Mix 70/30 FlexPen (70-30) 100 UNIT/ML FlexPen Generic drug: insulin aspart protamine - aspart Inject 75 Units into the skin 2 (two) times daily with a meal. What changed: how much to take   promethazine 25 MG suppository Commonly known as: PHENERGAN Place 25 mg rectally as needed for nausea or vomiting.   promethazine 12.5 MG tablet Commonly known as: PHENERGAN TAKE 1 TABLET BY MOUTH TWICE DAILY AS NEEDED FOR NAUSEA AND FOR VOMITING               Discharge Care Instructions  (From admission, onward)           Start     Ordered   09/07/21 0000  Leave dressing on - Keep it clean, dry, and intact until clinic visit        09/07/21 1201             Follow-up Information     Gwyneth RevelsFowler, Justin, DPM Follow up on 09/13/2021.   Specialty: Podiatry Why: wound check Contact information: [address] [facility] [place] [place] [address] [phone]         Luciana Axeoward, Shannon H, NP Follow up in 1 week(s).   Specialty: Family Medicine Contact information: [address] [place] [address] [phone]                 Allergies  Allergen Reactions   Metformin And Related      The results of significant diagnostics from this hospitalization (including imaging, microbiology, ancillary and laboratory) are listed below for reference.   Consultations:   Procedures/Studies: DG Foot Complete Right  Result Date: 09/05/2021 CLINICAL DATA:  Gangrene of right fifth digit EXAM: RIGHT FOOT COMPLETE - 3+ VIEW COMPARISON:  None. FINDINGS: Bony erosion of the distal phalanx of the right fifth digit. Prior amputation of the distal phalanx of the right great toe. No acute fracture or dislocation. 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 Diffuse soft tissue  edema about the included foot and ankle. IMPRESSION: 1. Bony erosion of the distal phalanx of the right fifth digit, consistent with osteomyelitis. 2. Prior amputation of the distal phalanx of the right great toe. 3. No acute fracture or dislocation. 4. 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 5. Diffuse soft tissue edema about the included foot and ankle. Electronically Signed   By: Jearld LeschAlex D Bibbey M.D.   On: 09/05/2021 19:54      Labs: BNP (last 3 results) No results for input(s): BNP in the last 8760 hours. Basic Metabolic Panel: Recent Labs  Lab 09/05/21 1738 09/06/21 0619 09/07/21 0640  NA 132* 133* 133*  K 3.9 3.7 4.3  CL 102 103 105  CO2 22 23 21*  GLUCOSE 442* 273* 271*  BUN 12 17 17   CREATININE 1.02* 1.07* 1.05*  CALCIUM 8.2* 8.1* 8.0*  MG  --   --  1.7   Liver Function Tests: Recent Labs  Lab 09/05/21 1738  AST 18  ALT 16  ALKPHOS 95  BILITOT 0.6  PROT 7.0  ALBUMIN 3.6   No results for input(s): LIPASE, AMYLASE in the last 168 hours. No results for input(s): AMMONIA in the last 168 hours. CBC: Recent Labs  Lab 09/05/21 1738 09/06/21 0619 09/07/21 0640  WBC 9.6 8.1 8.9  NEUTROABS 6.2  --   --   HGB 12.5 11.6* 11.8*  HCT 38.1 34.9* 36.6  MCV 89.9 88.8 90.4  PLT 274 196 235   Cardiac Enzymes: No results for input(s): CKTOTAL, CKMB, CKMBINDEX, TROPONINI in the last 168 hours. BNP: Invalid input(s): POCBNP CBG: Recent Labs  Lab 09/06/21 1149 09/06/21 1434 09/06/21 1639 09/07/21 0010 09/07/21 0816  GLUCAP 211* 182* 205* 345* 255*   D-Dimer No results for input(s): DDIMER in the last 72 hours. Hgb A1c Recent Labs    09/06/21 0619  HGBA1C 11.4*   Lipid Profile No results for input(s): CHOL, HDL, LDLCALC, TRIG, CHOLHDL, LDLDIRECT in the last 72 hours. Thyroid function studies No results for input(s): TSH, T4TOTAL, T3FREE, THYROIDAB in the last 72  hours.  Invalid input(s): FREET3 Anemia work up No results for input(s): VITAMINB12, FOLATE, FERRITIN, TIBC, IRON, RETICCTPCT in the last 72 hours. Urinalysis    Component Value Date/Time   COLORURINE YELLOW 09/06/2021 0117   APPEARANCEUR CLEAR 09/06/2021 0117   APPEARANCEUR CLEAR 08/18/2012 1504   LABSPEC 1.015 09/06/2021 0117   LABSPEC 1.020 08/18/2012 1504   PHURINE 5.5 09/06/2021 0117   GLUCOSEU >1,000 (A) 09/06/2021 0117   GLUCOSEU >/= 2000 mg/dL 09/08/2021 08/65/7846   HGBUR NEGATIVE 09/06/2021 0117   BILIRUBINUR NEGATIVE 09/06/2021 0117   BILIRUBINUR NEGATIVE 08/18/2012 1504   KETONESUR TRACE (A) 09/06/2021 0117   PROTEINUR NEGATIVE 09/06/2021 0117   NITRITE NEGATIVE 09/06/2021 0117   LEUKOCYTESUR NEGATIVE 09/06/2021 0117   LEUKOCYTESUR NEGATIVE 08/18/2012 1504   Sepsis Labs Invalid input(s): PROCALCITONIN,  WBC,  LACTICIDVEN Microbiology Recent Results (from the past 240 hour(s))  Blood culture (routine x 2)     Status: None (Preliminary result)   Collection Time: 09/05/21  4:28 PM   Specimen: BLOOD  Result Value Ref Range Status   Specimen Description BLOOD BLOOD LEFT HAND  Final   Special Requests BOTTLES DRAWN AEROBIC AND ANAEROBIC BCAV  Final   Culture   Final    NO GROWTH 2 DAYS Performed at [facility], [address] [place], [place]    Report Status PENDING  Incomplete  Blood culture (routine x 2)     Status: None (Preliminary result)   Collection Time: 09/05/21  4:28 PM   Specimen: BLOOD  Result Value Ref Range Status   Specimen Description BLOOD RIGHT ANTECUBITAL  Final   Special Requests BOTTLES DRAWN AEROBIC AND ANAEROBIC BCHV  Final   Culture   Final    NO GROWTH 2 DAYS Performed at [facility], [address] [place], [place]    Report Status PENDING  Incomplete  Resp Panel by RT-PCR (Flu A&B, Covid) Nasopharyngeal Swab     Status: None   Collection Time: 09/05/21  7:12 PM   Specimen: Nasopharyngeal Swab;  Nasopharyngeal(NP) swabs in vial transport medium  Result Value Ref  Range Status   SARS Coronavirus 2 by RT PCR NEGATIVE NEGATIVE Final    Comment: (NOTE) SARS-CoV-2 target nucleic acids are NOT DETECTED.  The SARS-CoV-2 RNA is generally detectable in upper respiratory specimens during the acute phase of infection. The lowest concentration of SARS-CoV-2 viral copies this assay can detect is 138 copies/mL. A negative result does not preclude SARS-Cov-2 infection and should not be used as the sole basis for treatment or other patient management decisions. A negative result may occur with  improper specimen collection/handling, submission of specimen other than nasopharyngeal swab, presence of viral mutation(s) within the areas targeted by this assay, and inadequate number of viral copies(<138 copies/mL). A negative result must be combined with clinical observations, patient history, and epidemiological information. The expected result is Negative.  Fact Sheet for Patients:  BloggerCourse.comhttps://www.fda.gov/media/152166/download  Fact Sheet for Healthcare Providers:  SeriousBroker.ithttps://www.fda.gov/media/152162/download  This test is no t yet approved or cleared by the [place] FDA and  has been authorized for detection and/or diagnosis of SARS-CoV-2 by FDA under an Emergency Use Authorization (EUA). This EUA will remain  in effect (meaning this test can be used) for the duration of the COVID-19 declaration under Section 564(b)(1) of the Act, 21 U.S.C.section 360bbb-3(b)(1), unless the authorization is terminated  or revoked sooner.       Influenza A by PCR NEGATIVE NEGATIVE Final   Influenza B by PCR NEGATIVE NEGATIVE Final    Comment: (NOTE) The Xpert Xpress SARS-CoV-2/FLU/RSV plus assay is intended as an aid in the diagnosis of influenza from Nasopharyngeal swab specimens and should not be used as a sole basis for treatment. Nasal washings and aspirates are unacceptable for Xpert Xpress  SARS-CoV-2/FLU/RSV testing.  Fact Sheet for Patients: BloggerCourse.comhttps://www.fda.gov/media/152166/download  Fact Sheet for Healthcare Providers: SeriousBroker.ithttps://www.fda.gov/media/152162/download  This test is not yet approved or cleared by the [place] FDA and has been authorized for detection and/or diagnosis of SARS-CoV-2 by FDA under an Emergency Use Authorization (EUA). This EUA will remain in effect (meaning this test can be used) for the duration of the COVID-19 declaration under Section 564(b)(1) of the Act, 21 U.S.C. section 360bbb-3(b)(1), unless the authorization is terminated or revoked.  Performed at [facility], [address] [place], [place] [address]   MRSA Next Gen by PCR, Nasal     Status: None   Collection Time: 09/05/21  9:57 PM   Specimen: Nasal Mucosa; Nasal Swab  Result Value Ref Range Status   MRSA by PCR Next Gen NOT DETECTED NOT DETECTED Final    Comment: (NOTE) The GeneXpert MRSA Assay (FDA approved for NASAL specimens only), is one component of a comprehensive MRSA colonization surveillance program. It is not intended to diagnose MRSA infection nor to guide or monitor treatment for MRSA infections. Test performance is not FDA approved in patients less than 42 years old. Performed at [facility], [address] [place], [place] [address]   Aerobic/Anaerobic Culture w Gram Stain (surgical/deep wound)     Status: None (Preliminary result)   Collection Time: 09/06/21  4:14 PM   Specimen: PATH Digit amputation; Tissue  Result Value Ref Range Status   Specimen Description   Final    TOE RIGHT 5TH Performed at [facility], [address] [place], [place] [address]    Special Requests   Final    NONE Performed at [facility], [address] [place], [place] [address]    Gram Stain   Final    FEW WBC PRESENT,  PREDOMINANTLY MONONUCLEAR NO ORGANISMS SEEN    Culture   Final    NO GROWTH < 24 HOURS Performed at [facility], [address] [place], [place] [address]    Report Status PENDING  Incomplete     Total time spend on discharging this patient, including the last patient exam, discussing the hospital stay, instructions for ongoing care as it relates to all pertinent caregivers, as well as preparing the medical discharge records, prescriptions, and/or referrals as applicable, is 30 minutes.    Darlin Priestly, MD  [facility] 09/07/2021, 12:03 PM

## 2021-09-07 NOTE — Progress Notes (Signed)
Daily Progress Note   Subjective  - 1 Day Post-Op  Right fifth toe amputation.  Doing well.  No complaints  Objective Vitals:   09/06/21 1719 09/06/21 2052 09/07/21 0432 09/07/21 0816  BP: (!) 157/87 (!) 141/70 116/70 (!) 98/44  Pulse: 98 86 70 75  Resp: 16 16 16 18   Temp: (!) 97.5 F (36.4 C) 97.7 F (36.5 C) 97.7 F (36.5 C) 98.4 F (36.9 C)  TempSrc:      SpO2: 100% 100% 98% 97%  Weight:      Height:        Physical Exam: Incisions well coapted.  No dehiscence.  Laboratory CBC    Component Value Date/Time   WBC 8.9 09/07/2021 0640   HGB 11.8 (L) 09/07/2021 0640   HCT 36.6 09/07/2021 0640   PLT 235 09/07/2021 0640    BMET    Component Value Date/Time   NA 133 (L) 09/07/2021 0640   K 4.3 09/07/2021 0640   CL 105 09/07/2021 0640   CO2 21 (L) 09/07/2021 0640   GLUCOSE 271 (H) 09/07/2021 0640   BUN 17 09/07/2021 0640   CREATININE 1.05 (H) 09/07/2021 0640   CALCIUM 8.0 (L) 09/07/2021 0640   GFRNONAA >60 09/07/2021 0640   GFRAA >60 08/09/2016 1315    Assessment/Planning: Status post right fifth toe amputation for osteomyelitis  This was most likely a therapeutic amputation.  I doubt there was any residual bone infection proximal.  Soft tissues look to be quite clear.  I recommended 5 days of continued oral antibiotics.  She has a fair amount of gastroparesis with most antibiotics.  I would recommend doxycycline.  I discussed with the patient taking a probiotic as well. She is to keep the dressing clean dry and intact.  Do not bathe the foot.  I will see her next week for dressing changes at that time.  Follow-up with podiatry next week.  She prefers to follow-up in [place].  I would recommend a follow-up on Wednesday morning of next week  From a podiatry standpoint she is stable for discharge.   Friday A  09/07/2021, 11:47 AM

## 2021-09-07 NOTE — Progress Notes (Signed)
Inpatient Diabetes Program Recommendations  AACE/ADA: New Consensus Statement on Inpatient Glycemic Control   Target Ranges:  Prepandial:   less than 140 mg/dL      Peak postprandial:   less than 180 mg/dL (1-2 hours)      Critically ill patients:  140 - 180 mg/dL    Latest Reference Range & Units 09/07/21 06:40  Glucose 70 - 99 mg/dL 673 (H)    Latest Reference Range & Units 09/06/21 08:04 09/06/21 11:49 09/06/21 14:34 09/06/21 16:39 09/06/21 21:00 09/07/21 00:10  Glucose-Capillary 70 - 99 mg/dL 419 (H)  Novolog 11 units 211 (H) 182 (H) 205 (H)  Novolog 7 units    70/30 25 units 345 (H)    Latest Reference Range & Units 03/24/19 11:43 09/06/21 06:19  Hemoglobin A1C 4.8 - 5.6 % 14.4  11.4 (H)   Review of Glycemic Control  Diabetes history: DM2 Outpatient Diabetes medications: 70/30 50 units BID Current orders for Inpatient glycemic control: 70/30 25 units BID, Novolog 0-20 units TID with meals  Inpatient Diabetes Program Recommendations:    Insulin: Noted 70/30 25 units BID ordered on 09/06/21 which was given once on 09/06/21. Lab glucose 271 mg/dl this morning. Patient should receive 70/30 25 units BID today.  HbgA1C:  A1C 11.4% on 09/06/21 indicating an average glucose of 280 mg/dl over the past 2-3 months.  NOTE: In reviewing chart, noted last office note with PCP Laren Everts, NP) was on 04/10/21. Per office note, patient was prescribed 70/30 80 units BID and was asked to start taking Trulicity once a week. Current home medication list has 70/30 50 units BID and Trulicity is not listed.   Thanks, Orlando Penner, RN, MSN, CDE Diabetes Coordinator Inpatient Diabetes Program [phone] (Team Pager from 8am to 5pm)

## 2021-09-08 LAB — SURGICAL PATHOLOGY

## 2021-09-10 LAB — CULTURE, BLOOD (ROUTINE X 2)
Culture: NO GROWTH
Culture: NO GROWTH

## 2021-09-10 NOTE — Anesthesia Postprocedure Evaluation (Signed)
Anesthesia Post Note  Patient: Laura Escobar  Procedure(s) Performed: AMPUTATION 5th TOE (Right: Toe)  Patient location during evaluation: PACU Anesthesia Type: General Level of consciousness: awake and alert Pain management: pain level controlled Vital Signs Assessment: post-procedure vital signs reviewed and stable Respiratory status: spontaneous breathing, nonlabored ventilation, respiratory function stable and patient connected to nasal cannula oxygen Cardiovascular status: blood pressure returned to baseline and stable Postop Assessment: no apparent nausea or vomiting Anesthetic complications: no   No notable events documented.   Last Vitals:  Vitals:   09/07/21 0816 09/07/21 1153  BP: (!) 98/44 (!) 172/99  Pulse: 75 79  Resp: 18 20  Temp: 36.9 C 36.8 C  SpO2: 97% 100%    Last Pain:  Vitals:   09/07/21 0816  TempSrc:   PainSc: Asleep                 Martha Clan

## 2021-09-11 LAB — AEROBIC/ANAEROBIC CULTURE W GRAM STAIN (SURGICAL/DEEP WOUND): Culture: NO GROWTH

## 2022-02-28 ENCOUNTER — Ambulatory Visit
Admission: EM | Admit: 2022-02-28 | Discharge: 2022-02-28 | Disposition: A | Discharge status: Home / Self Care | Payer: Medicare HMO | Attending: Emergency Medicine | Admitting: Emergency Medicine

## 2022-02-28 ENCOUNTER — Encounter: Payer: Self-pay | Admitting: Emergency Medicine

## 2022-02-28 ENCOUNTER — Other Ambulatory Visit: Payer: Self-pay

## 2022-02-28 DIAGNOSIS — J0140 Acute pansinusitis, unspecified: Secondary | ICD-10-CM | POA: Diagnosis present

## 2022-02-28 LAB — GROUP A STREP BY PCR: Group A Strep by PCR: NOT DETECTED

## 2022-02-28 MED ORDER — AMOXICILLIN 250 MG/5ML PO SUSR: 500.0000 mg | Freq: Two times a day (BID) | ORAL | 0 refills | Status: AC

## 2022-02-28 NOTE — ED Triage Notes (Signed)
Pt c/o sore throat, tender cervical lymph nodes, and facial pain. Started about a week ago. She has also had nasal congestion. Unsure if she has had a fever. Pt states she has to have all medications in liquid form because otherwise it will her gastroparesis.

## 2022-02-28 NOTE — Discharge Instructions (Addendum)
Today you are being treated for a sinus infection  Begin use of Amoxicillin every morning and every evening for the next 10 days, ideally you will begin to see improvement in about 48 hours  You may continue any of the medications that have been taking, may attempt any of the following below in addition    You can take Tylenol and/or Ibuprofen as needed for fever reduction and pain relief.   For cough: honey 1/2 to 1 teaspoon (you can dilute the honey in water or another fluid).  You can also use guaifenesin and dextromethorphan for cough. You can use a humidifier for chest congestion and cough.  If you don't have a humidifier, you can sit in the bathroom with the hot shower running.      For sore throat: try warm salt water gargles, cepacol lozenges, throat spray, warm tea or water with lemon/honey, popsicles or ice, or OTC cold relief medicine for throat discomfort.   For congestion: take a daily anti-histamine like Zyrtec, Claritin, and a oral decongestant, such as pseudoephedrine.  You can also use Flonase 1-2 sprays in each nostril daily.   It is important to stay hydrated: drink plenty of fluids (water, gatorade/powerade/pedialyte, juices, or teas) to keep your throat moisturized and help further relieve irritation/discomfort.

## 2022-02-28 NOTE — ED Provider Notes (Signed)
[facility]    CSN: [id number] Arrival date & time: 02/28/22  1301      History   Chief Complaint Chief Complaint  Patient presents with   Sore Throat   Facial Pain    HPI Laura Escobar is a 41 y.o. female.   Patient presents with left-sided ear fullness, left-sided sinus pain and pressure, postnasal drip, sore throat and body aches for 7 days.  No known sick contacts.  Tolerating food and liquids.  Has attempted use of ibuprofen, Goody's powder, Flonase, saline irrigation and salt water gargles which have been somewhat helpful.  Denies fever or chills, cough, shortness of breath, nasal congestion, rhinorrhea, abdominal pain, nausea, vomiting, diarrhea or headaches.  History of Addison's disease, bipolar disease, diabetes mellitus, hyperlipidemia, hypertension and marijuana usage.  Past Medical History:  Diagnosis Date   Addison disease (HCC) 2011   Bipolar 1 disorder (HCC) 2011   Charcot foot due to diabetes mellitus (HCC)    Diabetes mellitus without complication (HCC) 1998   Gastroparesis    Hyperlipidemia    Hypertension    Ketoacidosis    Marijuana abuse    PONV (postoperative nausea and vomiting)     Patient Active Problem List   Diagnosis Date Noted   Toe osteomyelitis, right (HCC) 09/06/2021   Osteomyelitis (HCC) 09/05/2021   Insulin dependent type 2 diabetes mellitus (HCC) 09/05/2021   Essential hypertension 09/05/2021   Hyperlipidemia 09/05/2021   Obesity, Class III, BMI 40-49.9 (morbid obesity) (HCC) 09/05/2021   Tobacco abuse 09/05/2021   Pressure ulcer 04/16/2015   Cellulitis 04/15/2015   DKA (diabetic ketoacidoses) 03/15/2015   UTI (lower urinary tract infection) 03/15/2015   Gastroparesis 03/15/2015    Past Surgical History:  Procedure Laterality Date   ABCESS DRAINAGE N/A    Buttocks 2011, right groin 2013   ABDOMINAL HYSTERECTOMY     AMPUTATION TOE Right 06/24/2015   Procedure: AMPUTATION TOE;  Surgeon: Gwyneth Revels, DPM;   Location: [facility];  Service: Podiatry;  Laterality: Right;   AMPUTATION TOE Right 09/06/2021   Procedure: AMPUTATION 5th TOE;  Surgeon: Gwyneth Revels, DPM;  Location: [facility];  Service: Podiatry;  Laterality: Right;    OB History   No obstetric history on file.      Home Medications    Prior to Admission medications   Medication Sig Start Date End Date Taking? Authorizing Provider  atorvastatin (LIPITOR) 20 MG tablet Take 20 mg by mouth daily. 02/17/21  Yes [provider]  Cholecalciferol 125 MCG (5000 UT) capsule Take by mouth. 01/20/21  Yes [provider]  cyanocobalamin 1000 MCG tablet Take 2 tablets daily for 2 weeks, then reduce to 1 tablet daily thereafter for Vitamin B12 Deficiency. 01/20/21  Yes [provider]  insulin aspart protamine - aspart (NOVOLOG MIX 70/30 FLEXPEN) (70-30) 100 UNIT/ML FlexPen Inject 75 Units into the skin 2 (two) times daily with a meal. 09/07/21 02/28/22 Yes Darlin Priestly, MD  promethazine (PHENERGAN) 12.5 MG tablet TAKE 1 TABLET BY MOUTH TWICE DAILY AS NEEDED FOR NAUSEA AND FOR VOMITING 12/20/18  Yes [provider]  amLODipine (NORVASC) 5 MG tablet Take 5 mg by mouth daily. 04/10/21   [provider]  losartan (COZAAR) 100 MG tablet Take 100 mg by mouth daily. 12/24/20   [provider]  promethazine (PHENERGAN) 25 MG suppository Place 25 mg rectally as needed for nausea or vomiting.    [provider]    Family History Family History  Problem Relation  Age of Onset   Diabetes Mother    Stroke Mother    Healthy Father     Social History Social History   Tobacco Use   Smoking status: Former    Packs/day: 0.25    Types: Cigarettes   Smokeless tobacco: Never  Vaping Use   Vaping Use: Some days  Substance Use Topics   Alcohol use: No    Alcohol/week: 0.0 standard drinks of alcohol    Comment: 1 glasas of wine twice a year   Drug use: Yes    Types: Marijuana    Comment: 3-4 joints 4-5 days  a week     Allergies   Amlodipine and Metformin and related   Review of Systems Review of Systems  Constitutional: Negative.   HENT:  Positive for ear pain, sinus pressure, sinus pain and sore throat. Negative for congestion, dental problem, drooling, ear discharge, facial swelling, hearing loss, mouth sores, nosebleeds, postnasal drip, rhinorrhea, sneezing, tinnitus, trouble swallowing and voice change.   Respiratory: Negative.    Cardiovascular: Negative.   Gastrointestinal: Negative.   Musculoskeletal: Negative.   Skin: Negative.      Physical Exam Triage Vital Signs ED Triage Vitals  Enc Vitals Group     BP 02/28/22 1321 (!) 171/82     Pulse Rate 02/28/22 1321 89     Resp 02/28/22 1321 18     Temp 02/28/22 1321 98 F (36.7 C)     Temp Source 02/28/22 1321 Oral     SpO2 02/28/22 1321 100 %     Weight 02/28/22 1317 (!) 313 lb 15 oz (142.4 kg)     Height 02/28/22 1317 5' 7.5" (1.715 m)     Head Circumference --      Peak Flow --      Pain Score 02/28/22 1316 10     Pain Loc --      Pain Edu? --      Excl. in GC? --    No data found.  Updated Vital Signs BP (!) 171/82 (BP Location: Left Arm)   Pulse 89   Temp 98 F (36.7 C) (Oral)   Resp 18   Ht 5' 7.5" (1.715 m)   Wt (!) 313 lb 15 oz (142.4 kg)   LMP 08/08/2016 (Exact Date)   SpO2 100%   BMI 48.44 kg/m   Visual Acuity Right Eye Distance:   Left Eye Distance:   Bilateral Distance:    Right Eye Near:   Left Eye Near:    Bilateral Near:     Physical Exam Constitutional:      Appearance: She is well-developed.  HENT:     Head: Normocephalic.     Right Ear: Tympanic membrane and ear canal normal.     Left Ear: Tympanic membrane and ear canal normal.     Nose: No congestion or rhinorrhea.     Right Turbinates: Swollen.     Left Turbinates: Swollen.     Right Sinus: No maxillary sinus tenderness or frontal sinus tenderness.     Left Sinus: Maxillary sinus tenderness and frontal sinus tenderness  present.     Mouth/Throat:     Mouth: Mucous membranes are moist.     Pharynx: Posterior oropharyngeal erythema present.     Tonsils: No tonsillar exudate. 0 on the right. 0 on the left.  Cardiovascular:     Rate and Rhythm: Normal rate and regular rhythm.     Pulses: Normal pulses.     Heart  sounds: Normal heart sounds.  Pulmonary:     Effort: Pulmonary effort is normal.     Breath sounds: Normal breath sounds.  Skin:    General: Skin is warm and dry.  Neurological:     Mental Status: She is alert and oriented to person, place, and time. Mental status is at baseline.  Psychiatric:        Mood and Affect: Mood normal.        Behavior: Behavior normal.      UC Treatments / Results  Labs (all labs ordered are listed, but only abnormal results are displayed) Labs Reviewed  GROUP A STREP BY PCR    EKG   Radiology No results found.  Procedures Procedures (including critical care time)  Medications Ordered in UC Medications - No data to display  Initial Impression / Assessment and Plan / UC Course  I have reviewed the triage vital signs and the nursing notes.  Pertinent labs & imaging results that were available during my care of the patient were reviewed by me and considered in my medical decision making (see chart for details).  Acute nonrecurrent pansinusitis  Vital signs are stable and patient is in no signs of distress, O2 saturation 100% on room air and lungs are clear to auscultation, strep PCR negative, all tenderness to the and the sinuses are present to the left side and his symptoms have been present for 7 days without signs of resolution we will begin bacterial coverage Amoxicillin 10-day course prescribed, recommended continued use of over-the-counter medications for additional supportive care, may follow-up with his urgent care as needed if symptoms persist or worsen Final Clinical Impressions(s) / UC Diagnoses   Final diagnoses:  None   Discharge  Instructions   None    ED Prescriptions   None    PDMP not reviewed this encounter.   Valinda Hoar, NP 02/28/22 1409

## 2023-11-14 ENCOUNTER — Other Ambulatory Visit: Payer: Self-pay | Admitting: Podiatry

## 2023-11-15 ENCOUNTER — Encounter: Payer: Self-pay | Admitting: Podiatry

## 2023-11-15 ENCOUNTER — Encounter
Admission: RE | Disposition: A | Discharge status: Home / Self Care | Payer: Self-pay | Source: Home / Self Care | Attending: Podiatry

## 2023-11-15 ENCOUNTER — Other Ambulatory Visit: Payer: Self-pay

## 2023-11-15 ENCOUNTER — Ambulatory Visit: Admitting: Anesthesiology

## 2023-11-15 ENCOUNTER — Ambulatory Visit
Admission: RE | Admit: 2023-11-15 | Discharge: 2023-11-15 | Disposition: A | Discharge status: Home / Self Care | Attending: Podiatry | Admitting: Podiatry

## 2023-11-15 DIAGNOSIS — M869 Osteomyelitis, unspecified: Secondary | ICD-10-CM | POA: Diagnosis not present

## 2023-11-15 DIAGNOSIS — E11621 Type 2 diabetes mellitus with foot ulcer: Secondary | ICD-10-CM | POA: Insufficient documentation

## 2023-11-15 DIAGNOSIS — Z79899 Other long term (current) drug therapy: Secondary | ICD-10-CM | POA: Diagnosis not present

## 2023-11-15 DIAGNOSIS — Z6841 Body Mass Index (BMI) 40.0 and over, adult: Secondary | ICD-10-CM | POA: Diagnosis not present

## 2023-11-15 DIAGNOSIS — B957 Other staphylococcus as the cause of diseases classified elsewhere: Secondary | ICD-10-CM | POA: Diagnosis not present

## 2023-11-15 DIAGNOSIS — L97513 Non-pressure chronic ulcer of other part of right foot with necrosis of muscle: Secondary | ICD-10-CM | POA: Insufficient documentation

## 2023-11-15 DIAGNOSIS — F17200 Nicotine dependence, unspecified, uncomplicated: Secondary | ICD-10-CM | POA: Diagnosis not present

## 2023-11-15 DIAGNOSIS — I10 Essential (primary) hypertension: Secondary | ICD-10-CM | POA: Insufficient documentation

## 2023-11-15 DIAGNOSIS — F319 Bipolar disorder, unspecified: Secondary | ICD-10-CM | POA: Insufficient documentation

## 2023-11-15 DIAGNOSIS — Z794 Long term (current) use of insulin: Secondary | ICD-10-CM | POA: Diagnosis not present

## 2023-11-15 DIAGNOSIS — E669 Obesity, unspecified: Secondary | ICD-10-CM | POA: Diagnosis not present

## 2023-11-15 DIAGNOSIS — M86171 Other acute osteomyelitis, right ankle and foot: Secondary | ICD-10-CM | POA: Diagnosis present

## 2023-11-15 DIAGNOSIS — E1142 Type 2 diabetes mellitus with diabetic polyneuropathy: Secondary | ICD-10-CM | POA: Diagnosis present

## 2023-11-15 HISTORY — PX: AMPUTATION TOE: SHX6595

## 2023-11-15 LAB — GLUCOSE, CAPILLARY
Glucose-Capillary: 100 mg/dL — ABNORMAL HIGH (ref 70–99)
Glucose-Capillary: 103 mg/dL — ABNORMAL HIGH (ref 70–99)

## 2023-11-15 SURGERY — AMPUTATION, TOE
Anesthesia: Monitor Anesthesia Care | Site: Toe | Laterality: Right

## 2023-11-15 MED ORDER — ONDANSETRON HCL 4 MG/2ML IJ SOLN
4.0000 mg | Freq: Four times a day (QID) | INTRAMUSCULAR | Status: DC | PRN
Start: 2023-11-15 — End: 2023-11-15

## 2023-11-15 MED ORDER — CEFAZOLIN SODIUM-DEXTROSE 3-4 GM/150ML-% IV SOLN
3.0000 g | INTRAVENOUS | Status: DC
  Filled 2023-11-15: qty 150

## 2023-11-15 MED ORDER — METOCLOPRAMIDE HCL 5 MG/ML IJ SOLN: 5.0000 mg | Freq: Three times a day (TID) | INTRAMUSCULAR | Status: DC | PRN

## 2023-11-15 MED ORDER — OXYCODONE HCL 5 MG/5ML PO SOLN: 5.0000 mg | Freq: Once | ORAL | Status: DC | PRN

## 2023-11-15 MED ORDER — LIDOCAINE HCL (PF) 2 % IJ SOLN
INTRAMUSCULAR | Status: AC
  Filled 2023-11-15: qty 5

## 2023-11-15 MED ORDER — CHLORHEXIDINE GLUCONATE 0.12 % MT SOLN
15.0000 mL | Freq: Once | OROMUCOSAL | Status: AC
  Administered 2023-11-15: 15 mL via OROMUCOSAL

## 2023-11-15 MED ORDER — GLYCOPYRROLATE 0.2 MG/ML IJ SOLN
INTRAMUSCULAR | Status: AC
  Filled 2023-11-15: qty 1

## 2023-11-15 MED ORDER — PROPOFOL 500 MG/50ML IV EMUL
INTRAVENOUS | Status: DC | PRN
  Administered 2023-11-15: 100 ug/kg/min via INTRAVENOUS

## 2023-11-15 MED ORDER — BUPIVACAINE HCL (PF) 0.5 % IJ SOLN
INTRAMUSCULAR | Status: AC
  Filled 2023-11-15: qty 30

## 2023-11-15 MED ORDER — OXYCODONE HCL 5 MG PO TABS: 5.0000 mg | ORAL_TABLET | Freq: Once | ORAL | Status: DC | PRN

## 2023-11-15 MED ORDER — SODIUM CHLORIDE 0.9 % IV SOLN: INTRAVENOUS | Status: DC

## 2023-11-15 MED ORDER — FENTANYL CITRATE (PF) 100 MCG/2ML IJ SOLN
INTRAMUSCULAR | Status: AC
  Filled 2023-11-15: qty 2

## 2023-11-15 MED ORDER — PROPOFOL 10 MG/ML IV BOLUS
INTRAVENOUS | Status: DC | PRN
  Administered 2023-11-15: 50 mg via INTRAVENOUS

## 2023-11-15 MED ORDER — CHLORHEXIDINE GLUCONATE 0.12 % MT SOLN
OROMUCOSAL | Status: AC
  Filled 2023-11-15: qty 15

## 2023-11-15 MED ORDER — MIDAZOLAM HCL 2 MG/2ML IJ SOLN
INTRAMUSCULAR | Status: AC
  Filled 2023-11-15: qty 2

## 2023-11-15 MED ORDER — CEFAZOLIN SODIUM-DEXTROSE 3-4 GM/150ML-% IV SOLN
3.0000 g | INTRAVENOUS | Status: AC
  Administered 2023-11-15: 3 g via INTRAVENOUS
  Filled 2023-11-15 (×2): qty 150

## 2023-11-15 MED ORDER — DROPERIDOL 2.5 MG/ML IJ SOLN: 0.6250 mg | Freq: Once | INTRAMUSCULAR | Status: DC | PRN

## 2023-11-15 MED ORDER — PROPOFOL 10 MG/ML IV BOLUS
INTRAVENOUS | Status: AC
  Filled 2023-11-15: qty 20

## 2023-11-15 MED ORDER — ONDANSETRON HCL 4 MG/2ML IJ SOLN
INTRAMUSCULAR | Status: AC
  Filled 2023-11-15: qty 2

## 2023-11-15 MED ORDER — SCOPOLAMINE 1 MG/3DAYS TD PT72
MEDICATED_PATCH | TRANSDERMAL | Status: AC
  Filled 2023-11-15: qty 1

## 2023-11-15 MED ORDER — MIDAZOLAM HCL 2 MG/2ML IJ SOLN
INTRAMUSCULAR | Status: DC | PRN
  Administered 2023-11-15: 2 mg via INTRAVENOUS

## 2023-11-15 MED ORDER — ACETAMINOPHEN 10 MG/ML IV SOLN: 1000.0000 mg | Freq: Once | INTRAVENOUS | Status: DC | PRN

## 2023-11-15 MED ORDER — SCOPOLAMINE 1 MG/3DAYS TD PT72
1.0000 | MEDICATED_PATCH | Freq: Once | TRANSDERMAL | Status: DC
  Administered 2023-11-15: 1.5 mg via TRANSDERMAL

## 2023-11-15 MED ORDER — FENTANYL CITRATE (PF) 100 MCG/2ML IJ SOLN
25.0000 ug | INTRAMUSCULAR | Status: DC | PRN
Start: 2023-11-15 — End: 2023-11-15

## 2023-11-15 MED ORDER — GLYCOPYRROLATE 0.2 MG/ML IJ SOLN
INTRAMUSCULAR | Status: DC | PRN
  Administered 2023-11-15: .2 mg via INTRAVENOUS

## 2023-11-15 MED ORDER — ONDANSETRON HCL 4 MG PO TABS: 4.0000 mg | ORAL_TABLET | Freq: Four times a day (QID) | ORAL | Status: DC | PRN

## 2023-11-15 MED ORDER — ONDANSETRON HCL 4 MG/2ML IJ SOLN
INTRAMUSCULAR | Status: DC | PRN
  Administered 2023-11-15: 4 mg via INTRAVENOUS

## 2023-11-15 MED ORDER — METOCLOPRAMIDE HCL 10 MG PO TABS: 5.0000 mg | ORAL_TABLET | Freq: Three times a day (TID) | ORAL | Status: DC | PRN

## 2023-11-15 MED ORDER — BUPIVACAINE HCL 0.5 % IJ SOLN
INTRAMUSCULAR | Status: DC | PRN
  Administered 2023-11-15: 7 mL

## 2023-11-15 SURGICAL SUPPLY — 44 items
BLADE OSC/SAGITTAL MD 5.5X18 (BLADE) ×1 IMPLANT
BLADE SURG MINI STRL (BLADE) ×1 IMPLANT
BNDG COHESIVE 4X5 TAN STRL LF (GAUZE/BANDAGES/DRESSINGS) ×1 IMPLANT
BNDG ELASTIC 4X5.8 VLCR NS LF (GAUZE/BANDAGES/DRESSINGS) ×1 IMPLANT
BNDG ESMARCH 4X12 STRL LF (GAUZE/BANDAGES/DRESSINGS) ×1 IMPLANT
BNDG GAUZE DERMACEA FLUFF 4 (GAUZE/BANDAGES/DRESSINGS) ×1 IMPLANT
BNDG STRETCH GAUZE 3IN X12FT (GAUZE/BANDAGES/DRESSINGS) ×2 IMPLANT
CUFF TOURN SGL QUICK 12 (TOURNIQUET CUFF) IMPLANT
CUFF TOURN SGL QUICK 18X4 (TOURNIQUET CUFF) IMPLANT
DRAPE FLUOR MINI C-ARM 54X84 (DRAPES) ×1 IMPLANT
DRAPE XRAY CASSETTE 23X24 (DRAPES) ×1 IMPLANT
DURAPREP 26ML APPLICATOR (WOUND CARE) ×1 IMPLANT
ELECT REM PT RETURN 9FT ADLT (ELECTROSURGICAL) ×1 IMPLANT
ELECTRODE REM PT RTRN 9FT ADLT (ELECTROSURGICAL) ×1 IMPLANT
GAUZE PACKING IODOFORM 1/2INX (GAUZE/BANDAGES/DRESSINGS) ×1 IMPLANT
GAUZE SPONGE 4X4 12PLY STRL (GAUZE/BANDAGES/DRESSINGS) ×1 IMPLANT
GAUZE STRETCH 2X75IN STRL (MISCELLANEOUS) ×1 IMPLANT
GAUZE XEROFORM 1X8 LF (GAUZE/BANDAGES/DRESSINGS) ×1 IMPLANT
GLOVE BIO SURGEON STRL SZ7.5 (GLOVE) ×1 IMPLANT
GLOVE INDICATOR 8.0 STRL GRN (GLOVE) ×1 IMPLANT
GOWN STRL REUS W/ TWL XL LVL3 (GOWN DISPOSABLE) ×1 IMPLANT
GOWN STRL REUS W/TWL XL LVL4 (GOWN DISPOSABLE) ×1 IMPLANT
KIT TURNOVER KIT A (KITS) ×1 IMPLANT
LABEL OR SOLS (LABEL) ×1 IMPLANT
MANIFOLD NEPTUNE II (INSTRUMENTS) ×1 IMPLANT
NDL FILTER BLUNT 18X1 1/2 (NEEDLE) ×1 IMPLANT
NDL HYPO 25X1 1.5 SAFETY (NEEDLE) ×1 IMPLANT
NEEDLE FILTER BLUNT 18X1 1/2 (NEEDLE) ×1 IMPLANT
NEEDLE HYPO 25X1 1.5 SAFETY (NEEDLE) ×1 IMPLANT
NS IRRIG 500ML POUR BTL (IV SOLUTION) ×1 IMPLANT
PACK EXTREMITY ARMC (MISCELLANEOUS) ×1 IMPLANT
PAD ABD DERMACEA PRESS 5X9 (GAUZE/BANDAGES/DRESSINGS) ×2 IMPLANT
PULSAVAC PLUS IRRIG FAN TIP (DISPOSABLE) ×1 IMPLANT
SHIELD FULL FACE ANTIFOG 7M (MISCELLANEOUS) ×1 IMPLANT
SOL .9 NS 3000ML IRR UROMATIC (IV SOLUTION) ×1 IMPLANT
STOCKINETTE M/LG 89821 (MISCELLANEOUS) ×1 IMPLANT
STRAP SAFETY 5IN WIDE (MISCELLANEOUS) ×1 IMPLANT
SUT ETHILON 3-0 FS-10 30 BLK (SUTURE) ×1 IMPLANT
SUT ETHILON 5-0 FS-2 18 BLK (SUTURE) ×1 IMPLANT
SUTURE EHLN 3-0 FS-10 30 BLK (SUTURE) ×1 IMPLANT
SYR 10ML LL (SYRINGE) ×3 IMPLANT
TIP FAN IRRIG PULSAVAC PLUS (DISPOSABLE) ×1 IMPLANT
TRAP FLUID SMOKE EVACUATOR (MISCELLANEOUS) ×1 IMPLANT
WATER STERILE IRR 500ML POUR (IV SOLUTION) ×1 IMPLANT

## 2023-11-15 NOTE — Transfer of Care (Signed)
 Immediate Anesthesia Transfer of Care Note  Patient: Laura Escobar  Procedure(s) Performed: AMPUTATION, TOE (Right: Toe)  Patient Location: PACU  Anesthesia Type:MAC  Level of Consciousness: awake, alert , oriented, and patient cooperative  Airway & Oxygen Therapy: Patient Spontanous Breathing  Post-op Assessment: Report given to RN and Post -op Vital signs reviewed and stable  Post vital signs: Reviewed and stable  Last Vitals:  Vitals Value Taken Time  BP 153/78 11/15/23 1435  Temp 97   Pulse 80 11/15/23 1439  Resp 18 11/15/23 1438  SpO2 100 % 11/15/23 1439  Vitals shown include unfiled device data.  Last Pain:  Vitals:   11/15/23 1143  TempSrc: Tympanic         Complications: No notable events documented.

## 2023-11-15 NOTE — Discharge Instructions (Addendum)

## 2023-11-15 NOTE — Op Note (Signed)
 Operative note   Surgeon:Tremond Shimabukuro [employer] logistics/support/administrative officer: None    Preop diagnosis: Osteomyelitis distal right second toe    Postop diagnosis: Same    Procedure: Amputation right second toe at proximal phalanx    EBL: Minimal    Anesthesia:local and IV sedation    Hemostasis: None    Specimen: Osteomyelitis right second toe for pathology and deep wound culture    Complications: None    Operative indications:Laura Escobar is an 43 y.o. that presents today for surgical intervention.  The risks/benefits/alternatives/complications have been discussed and consent has been given.    Procedure:  Patient was brought into the OR and placed on the operating table in thesupine position. After anesthesia was obtained theright lower extremity was prepped and draped in usual sterile fashion.  Attention was directed to the right second toe where 2 semielliptical incisions were made.  This was performed at the PIPJ region.  Full-thickness flaps were created.  Just proximal to the proximal phalanx head a an osteotomy was created.  The toe was then removed from the surgical field in toto.  Wounds were flushed with copious amounts of irrigation.  Closure was then performed with a 3-0 nylon.  A large bulky sterile dressing was applied.  Patient tolerated the procedure and anesthesia well.    Patient tolerated the procedure and anesthesia well.  Was transported from the OR to the PACU with all vital signs stable and vascular status intact. To be discharged per routine protocol.  Will follow up in approximately 1 week in the outpatient clinic.

## 2023-11-15 NOTE — Anesthesia Preprocedure Evaluation (Addendum)
 Anesthesia Evaluation  Patient identified by MRN, date of birth, ID band Patient awake    Reviewed: Allergy & Precautions, H&P , NPO status , Patient's Chart, lab work & pertinent test results  History of Anesthesia Complications (+) PONV and history of anesthetic complications (pt requests a scopolamine patch)  Airway Mallampati: II  TM Distance: >3 FB Neck ROM: full    Dental no notable dental hx.    Pulmonary former smoker   Pulmonary exam normal        Cardiovascular METS: 5 - 7 Mets hypertension, Normal cardiovascular exam     Neuro/Psych  PSYCHIATRIC DISORDERS   Bipolar Disorder    Neuromuscular disease (polyneuropathy)    GI/Hepatic ,,,(+)     substance abuse  marijuana useGastroparesis with certain medications. Pt states she has not had problems in months with severe GERD or vomiting.    Endo/Other  diabetes    Renal/GU negative Renal ROS  negative genitourinary   Musculoskeletal   Abdominal  (+) + obese  Peds  Hematology negative hematology ROS (+)   Anesthesia Other Findings Past Medical History: 2011: Addison disease (HCC) 2011: Bipolar 1 disorder (HCC) No date: Charcot foot due to diabetes mellitus (HCC) 1998: Diabetes mellitus without complication (HCC) No date: Gastroparesis No date: Hyperlipidemia No date: Hypertension No date: Ketoacidosis No date: Marijuana abuse No date: PONV (postoperative nausea and vomiting)  Past Surgical History: No date: ABCESS DRAINAGE; N/A     Comment:  Buttocks 2011, right groin 2013 No date: ABDOMINAL HYSTERECTOMY 06/24/2015: AMPUTATION TOE; Right     Comment:  Procedure: AMPUTATION TOE;  Surgeon: Gwyneth Revels, DPM;              Location: [facility];  Service: Podiatry;  Laterality:               Right; 09/06/2021: AMPUTATION TOE; Right     Comment:  Procedure: AMPUTATION 5th TOE;  Surgeon: Gwyneth Revels,              DPM;  Location: [facility];  Service:  Podiatry;                Laterality: Right;  BMI    Body Mass Index: 48.44 kg/m      Reproductive/Obstetrics negative OB ROS                             Anesthesia Physical Anesthesia Plan  ASA: 3  Anesthesia Plan: Regional and MAC   Post-op Pain Management: Minimal or no pain anticipated   Induction: Intravenous  PONV Risk Score and Plan: Propofol infusion and TIVA  Airway Management Planned: Natural Airway  Additional Equipment:   Intra-op Plan:   Post-operative Plan:   Informed Consent: I have reviewed the patients History and Physical, chart, labs and discussed the procedure including the risks, benefits and alternatives for the proposed anesthesia with the patient or authorized representative who has indicated his/her understanding and acceptance.     Dental Advisory Given  Plan Discussed with: CRNA and Surgeon  Anesthesia Plan Comments:         Anesthesia Quick Evaluation

## 2023-11-15 NOTE — H&P (Signed)
 HISTORY AND PHYSICAL INTERVAL NOTE:  11/15/2023  12:16 PM  Laura Escobar  has presented today for surgery, with the diagnosis of Right foot ulcer, with necrosis of muscle HCC L97.513 Acute osteomyelitis of right ankle or foot HCC M86.171 Type 2 diabetes mellitus with polyneuropathy HCC E11.42.  The various methods of treatment have been discussed with the patient.  No guarantees were given.  After consideration of risks, benefits and other options for treatment, the patient has consented to surgery.  I have reviewed the patients' chart and labs.     A history and physical examination was performed in my office.  The patient was reexamined.  There have been no changes to this history and physical examination.  Gwyneth Revels A

## 2023-11-18 ENCOUNTER — Encounter: Payer: Self-pay | Admitting: Podiatry

## 2023-11-18 NOTE — Anesthesia Postprocedure Evaluation (Addendum)
 Anesthesia Post Note  Patient: Laura Escobar  Procedure(s) Performed: AMPUTATION, TOE (Right: Toe)  Patient location during evaluation: PACU Anesthesia Type: MAC Level of consciousness: awake and alert Pain management: pain level controlled Vital Signs Assessment: post-procedure vital signs reviewed and stable Respiratory status: spontaneous breathing, nonlabored ventilation and respiratory function stable Cardiovascular status: blood pressure returned to baseline and stable Postop Assessment: no apparent nausea or vomiting Anesthetic complications: no   No notable events documented.   Last Vitals:  Vitals:   11/15/23 1500 11/15/23 1519  BP: 131/88 (!) 141/83  Pulse: 79 76  Resp: (!) 21 16  Temp: (!) 36.1 C 36.4 C  SpO2: 100% 93%    Last Pain:  Vitals:   11/15/23 1519  TempSrc: Tympanic  PainSc: 0-No pain                 Foye Deer

## 2023-11-19 LAB — SURGICAL PATHOLOGY

## 2023-11-20 LAB — AEROBIC/ANAEROBIC CULTURE W GRAM STAIN (SURGICAL/DEEP WOUND): Gram Stain: NONE SEEN

## 2024-02-14 NOTE — Discharge Summary (Addendum)
 Physician Discharge Summary [facility] [address] [facility] [address] [place] [address] Dept: [phone] Loc: [phone]   Identifying Information:  Laura Escobar [birth year] [id number]  Primary Care Physician: Laura Escobar  Code Status: Full Code  Admit Date: 02/02/2024  Discharge Date: 02/14/2024   Discharge To: Skilled nursing facility  Discharge Service: [facility]   Discharge Attending Physician: Jenkins Earnie Gu, MD  Discharge Diagnoses: Principal Problem:   Sepsis    (POA: Yes) Active Problems:   Morbid obesity (CMS-HCC) (POA: Yes)   Type 2 diabetes mellitus    (POA: Yes)   Unspecified Depressive Disorder (POA: Yes)   Tobacco use disorder (POA: Yes)   Toe osteomyelitis    (POA: Unknown) Resolved Problems:   * No resolved hospital problems. *   Outpatient Provider Follow Up Issues:  Monitor BMP, repeat June 30th, and then weekly thereafter to ensure stable   Hospital Course:  Laura Escobar is a 43 y.o. female who is presenting to [facility] with Sepsis due to osteomyelitis/bacteremia in the setting of the following pertinent/contributing co-morbidities: obesity, diabetes, hypertension, bipolar disorder, tobacco and cannabis abuse, charcot foot.   Sepsis     R foot cellulitis and possible 3rd digit osteomyelitis Follows with [facility]. Most recently underwent an office debridement of ulceration of the distal right third toe on 5/28. Presenting here with worsening right foot edema and pain as well as signs of sepsis with tachycardia and elevated lactate.  CT with evidence of bone involvement, no evidence of abscess. PVL venous duplex negative for acute DVT. Seen by Podiatry, S/P right third toe amputation on 6/1.  Consulted ID for antibiotic assistance.   Based on OR cultures sensitivities showing staph dysgalactiae and methicillin susceptible Staph aureus, we will complete a treatment course through 7/1 with cefazolin  while inpatient and  switch to linezolid on discharge (clean margins without growth).  recommend close follow-up with podiatry Dr. Lowry at the [facility] wound care clinic .  Discharge dressing recommendations :  Change Foot dressing daily RIGHT FOOT  Wash with wound cleanserThen Dry Then apply betadine  soaked adapetic to surgical site and all wounds Then Apply betadine  soaked gauze over surgical site and all wounds Then apply dry gauze at those same sites and pad it well Then apply gauze to the ankle to avoid pressure sore Apply gauze to the heel to avoid pressure sore Apply Kerlix without strangulation without tightness (from toes to ankle) Then apply ace without tightness and tape (From toes to the ankle) Please make sure the ankle is not strangulated after the dressing if you need to apply dressing farther up for evan pressure like mid calf Then apply surgical shoe Elevate the limb on blankets and avoid pressure to the surgery site and the heel to avoid pressure sores post op Right lower extremity with weightbearing at heel, limit pressure at the ball of the foot and toes  Non-ogliouric acute kidney injury-baseline creatinine in mid ones, developed worsening renal injury with creatinine peaking at 3.4 and then improving to 2.97 on discharge.  Seen by nephrology and felt to be secondary to ATN.  Renal ultrasound without obstruction.  Would monitor creatinine outpatient to ensure improving.  Suggest repeating BMP to monitor creatinine on Monday and then weekly thereafter. Nephrology will try to ensure close follow-up. If significantly uptrending Cr, would represent for medical care. Patient should follow-up with nephrology outpt.    Streptococcus dysgalactiae ss equisimilis (grp c/g) bacteremia2/2 blood cultures from 6/15ily positive for  group c/g streptococcus. Suspect likely source is SSTI/bone infection as above.  Patient is clinically improving.  TTE limited study but EF >55%, NWMA.  ID left recommendations as  above.  Lower extremity swelling and neuropathic type pain: Worsening swelling in the setting of home diuretics being held as well as infection.  Diuresis originally held with ATN and poor intake but started torsemide  on discharge.  If develops increasing urine output concerning for post ATN diuresis would hold.  Voltaren  and lidocaine  patches given for neuropathic pain.    Type 2 diabetesHgb A1c 10.9 on 6/15, indicative of poorly controlled diabetes. Hyperglycemic with anion gap 15 and elevated beta hydroxybutyrate upon admission.  Was treated with supportive care.  Home regimen is 70/30 90 units twice daily. Blood glucose remains suboptimal.  Discharge regimen NPH 40 units twice daily, mealtime insulin  12 units 3 times daily AC, should be held if does not eat, give a half dose if only eats 50% of meals, correctional sliding scale as below.    Tobacco use disorder-received nicotine  patch while inpatient as well as tobacco cessation counseling.  Discharged with nicotine  patches.   Acute on chronic nausea and abdominal pain  Gastroparesis-patient has known gastroparesis.  Had some nausea and vomiting which improved throughout admission.  Given a bowel regimen to promote stable bowel habits.  Hypertension: The patient is on losartan  and metolazone  outpatient.  These held due to elevated creatinine and diuresis with torsemide  as discussed above.  As creatinine improves to near baseline would resume losartan .  Also stated that she took hydrochlorothiazide with triamterene at baseline although fill end of Jan for 90 dats.   Hypokalemia : Improved and stable. Likely secondary to poor oral intake.  Received some potassium supplementation inpatient with stabilization.  Anxiety/depression, Bipolar 1-medications were continued.  Since she is discharging on linezolid, doxepin  should be held until after finishing linezolid dosing.  Does have a as needed Ativan  that was prescribed previously but not given during  admission.  Procedures:  ______________________________________________________________________ Discharge Medications:   Your Medication List     PAUSE taking these medications    doxepin  10 MG capsule Wait to take this until: February 19, 2024 Commonly known as: SINEQUAN  Take 1 capsule (10 mg total) by mouth nightly.   LORazepam  1 MG tablet Wait to take this until your doctor or other care provider tells you to start again. Commonly known as: ATIVAN  Take 1 tablet (1 mg total) by mouth every eight (8) hours as needed for anxiety.   losartan  100 MG tablet Wait to take this until your doctor or other care provider tells you to start again. Commonly known as: COZAAR  Take 0.5 tablets (50 mg total) by mouth daily.   metOLazone  2.5 MG tablet Wait to take this until your doctor or other care provider tells you to start again. Commonly known as: ZAROXOLYN  Take 1 tablet (2.5 mg total) by mouth every other day.   triamterene-hydroCHLOROthiazide 37.5-25 mg per tablet Wait to take this until your doctor or other care provider tells you to start again. Commonly known as: MAXZIDE-25 Take 1 tablet by mouth in the morning.       STOP taking these medications    ibuprofen  600 MG tablet Commonly known as: MOTRIN    NovoLOG  Mix 70-30FlexPen U-100 100 unit/mL (70-30) injection pen Generic drug: insulin  aspart protamine-insulin  aspart       START taking these medications    diclofenac  sodium 1 % gel Commonly known as: VOLTAREN  Apply 2 g topically  four (4) times a day as needed for arthritis.   insulin  lispro 100 unit/mL injection pen Commonly known as: HumaLOG Take 12 units for each meal, if does not eat meal, use only correctional scale, if eats 50% of meal, give 50% dose and for blood glucose results:   51-70: Consume juice or crackers. 71-150: Give 0 units. 151-200: Give 1 unit. 201-250: Give 2 units. 251-300: Give 3 units. 301-350: Give 4 units. 351-400: Give 5 units.    lidocaine  4 % patch Commonly known as: ASPERCREME Place 1 patch on the skin daily.   linezolid 600 mg tablet Commonly known as: ZYVOX Take 1 tablet (600 mg total) by mouth every twelve (12) hours for 4 days.   nicotine  14 mg/24 hr patch Commonly known as: NICODERM CQ  Place 1 patch on the skin daily.   nicotine  polacrilex 2 MG lozenge Commonly known as: NICORETTE Apply 1 lozenge (2 mg total) to cheek every hour as needed for smoking cessation.   NovoLIN N NPH U-100 Insulin  100 unit/mL injection Generic drug: insulin  NPH Inject 0.4 mL (40 Units total) under the skin two (2) times a day.   pantoprazole  40 MG tablet Commonly known as: Protonix  Take 1 tablet (40 mg total) by mouth daily before breakfast.   polyethylene glycol 17 gram packet Commonly known as: MIRALAX  Take 17 g by mouth daily.   senna 8.6 mg tablet Commonly known as: SENOKOT Take 2 tablets by mouth nightly as needed for constipation.   torsemide  20 MG tablet Commonly known as: DEMADEX  Take 1 tablet (20 mg total) by mouth daily.       CONTINUE taking these medications    ACCU-CHEK AVIVA PLUS METER Misc Generic drug: blood-glucose meter FOLLOW PACKAGE DIRECTIONS   acetaminophen  325 MG tablet Commonly known as: TYLENOL  Take 2 tablets (650 mg total) by mouth every six (6) hours as needed.   atorvastatin  20 MG tablet Commonly known as: LIPITOR Take 1 tablet (20 mg total) by mouth in the morning.   BD ULTRA-FINE SHORT PEN NEEDLE 31 gauge x 5/16 (8 mm) Ndle Generic drug: pen needle, diabetic Use as directed.   cholecalciferol (vitamin D3-50 mcg (2,000 unit)) 50 mcg (2,000 unit) Cap Take 1 capsule (50 mcg total) by mouth daily.   cyanocobalamin (vitamin B-12) 1,000 mcg Tber Take 1 tablet by mouth daily.   FLUoxetine  20 MG capsule Commonly known as: PROZAC  Take 1 capsule (20 mg total) by mouth daily.   insulin  syringe-needle U-100 0.5 mL 31 gauge x 5/16 (8 mm) Syrg USE TWICE DAILY    meclizine 25 mg tablet Commonly known as: ANTIVERT Chew every hour as needed.   mv,Ca,min-FA-herbal no.157 400 mcg Tab Herbal Name: apple cider vinegar  1 gummy by mouth once daily   ONETOUCH ULTRA TEST Strp Generic drug: blood sugar diagnostic USE 1 STRIP TO CHECK GLUCOSE THREE TIMES DAILY        Allergies: Amlodipine , Metformin, and Olanzapine ______________________________________________________________________ Pending Test Results (if blank, then none): Pending Labs     Order Current Status   Fungal (Mould) Pathogen Culture Preliminary result   Fungal (Mould) Pathogen Culture Preliminary result       Most Recent Labs: All lab results last 24 hours -  Recent Results (from the past 24 hours)  POCT Glucose   Collection Time: 02/13/24  2:48 PM  Result Value Ref Range   Glucose, POC 288 (H) 70 - 179 mg/dL  POCT Glucose   Collection Time: 02/13/24  5:22 PM  Result  Value Ref Range   Glucose, POC 277 (H) 70 - 179 mg/dL  POCT Glucose   Collection Time: 02/13/24  8:50 PM  Result Value Ref Range   Glucose, POC 276 (H) 70 - 179 mg/dL  POCT Glucose   Collection Time: 02/14/24  7:19 AM  Result Value Ref Range   Glucose, POC 270 (H) 70 - 179 mg/dL  POCT Glucose   Collection Time: 02/14/24 11:29 AM  Result Value Ref Range   Glucose, POC 256 (H) 70 - 179 mg/dL    Relevant Studies/Radiology (if blank, then none): US  Renal Complete Result Date: 02/12/2024 EXAM: US  RENAL COMPLETE ACCESSION: [id number] UN REPORT DATE: 02/12/2024 9:28 AM CLINICAL INDICATION: 43 years old with evaluate for hydronephrosis, baseline kidney disease  COMPARISON: CT abdomen pelvis 06/23/2021 TECHNIQUE: Static and cine images of the kidneys and bladder were performed. FINDINGS: Limited evaluation secondary to body habitus and overlying bowel gas, with significantly limited evaluation of the left kidney. KIDNEYS: Lobulated renal contours. Normal size and echogenicity. No solid masses or calculi. No  hydronephrosis.      Right kidney: 11.8 cm      Left kidney: 10.6 cm BLADDER: Unremarkable.      Bladder volume prevoid: 89.1 mL      -Significantly limited evaluation of the left kidney. No obvious hydronephrosis.   XR Foot 3 Or More Views Right Result Date: 02/05/2024 EXAM: XR FOOT 3 OR MORE VIEWS RIGHT DATE: 02/05/2024 3:27 PM ACCESSION: [id number] UN DICTATED: 02/05/2024 3:32 PM INTERPRETATION LOCATION: [facility] CLINICAL INDICATION: 43 years old Female with Post op evaluation  COMPARISON: Right foot radiograph dated 02/04/2023. TECHNIQUE: Dorsoplantar, oblique and lateral views of the right foot. FINDINGS: Interval amputation of the third ray at the metatarsophalangeal joint. Soft tissue metallic staples at the forefoot and associated soft tissue swelling. Unchanged amputation of the first toe at the level of the IP joint, of the second toe at the level of the proximal phalanx shaft and of the fifth toe at the level of the MTP joint. Amputation margins are preserved without erosions to suggest osteomyelitis. Unchanged appearance of 2 arthrodesis screws traversing the midfoot, one extending from the first metatarsal across the cuneiform, navicular and into the talus and the other extending from the fourth metatarsal base into the calcaneus. There is unchanged bone resorption about the metatarsal portion of both screws.  Sclerosis and partial collapse of the hind and midfoot again suggest underlying neuropathic arthropathy. Plantar calcaneal enthesophyte.   Interval amputation of the third ray at the metatarsophalangeal joint.   XR Foot 3 Or More Views Right Result Date: 02/04/2024 EXAM: XR FOOT 3 OR MORE VIEWS RIGHT, XR TOES RIGHT DATE: 02/04/2024 12:39 PM ACCESSION: [id number] ROLANDA [id number] UN DICTATED: 02/04/2024 1:42 PM INTERPRETATION LOCATION: [facility] CLINICAL INDICATION: 43 years old Female with hardware evaluation and foot  COMPARISON: CT 02/02/2024 TECHNIQUE: Dorsoplantar, oblique and  lateral views of the right foot. AP, lateral, and oblique views of the right toes FINDINGS: Extensive diffuse soft tissue swelling present throughout the entire visualized foot, ankle and into the lower leg. No well-defined wound is appreciated. No radiodense foreign body or deep/tracking soft tissue gas present. Given imaging technique and extent of soft tissue swelling, some regions are difficult to assess, particular within the region of deeper skinfolds, the posterior heel and the toes. Postoperative changes compatible with previous hardware arthrodesis with a single long threaded screw extending from the midshaft of the first metatarsal through the tarsal bones and into the talus. There is lucency surrounding  the distal aspect of the screw within the metatarsal, concerning for hardware loosening. Additionally, there does not appear to be solid osseous fusion of the first TMT, medial naviculocuneiform or talonavicular joint on current imaging. Additional screw placement extending from the base of the fourth metatarsal through the cuboid and into the calcaneus present. There may be a component of osseous lucency surrounding the distal margin of the screw in the fourth metatarsal. Poorly defined advanced arthrosis throughout the midfoot and hindfoot. Moderately sized plantar calcaneal enthesopathy. Alignment most compatible with pes planus. Additional dedicated imaging of the forefoot/toes demonstrates prior amputations of the first, second and fifth rays. Amputation margins appear well-defined with no evidence for acute osteomyelitis at the distal amputation margins. Postoperative soft tissue changes are peripheral to these findings. The distal tip of the third phalanx is poorly defined given overlapping structures and imaging technique. Chronic appearing arthrosis of the first MTP joint.   1.  Marked soft tissue swelling throughout the foot, ankle and lower leg. 2.  Postoperative hardware arthrodesis involving  the first and fourth rays. Solid osseous fusion at the calcaneocuboid articulation. No definite solid osseous fusion at the tarsometatarsal or other intertarsal articulations. 3.  Chronic appearing arthrosis throughout the hindfoot and midfoot. 4.  Previous amputations of the first, second and fifth rays with no evidence for acute osteomyelitis at the amputation margins.   XR Toes Right Result Date: 02/04/2024 EXAM: XR FOOT 3 OR MORE VIEWS RIGHT, XR TOES RIGHT DATE: 02/04/2024 12:39 PM ACCESSION: [id number] ROLANDA [id number] UN DICTATED: 02/04/2024 1:42 PM INTERPRETATION LOCATION: [facility] CLINICAL INDICATION: 43 years old Female with hardware evaluation and foot  COMPARISON: CT 02/02/2024 TECHNIQUE: Dorsoplantar, oblique and lateral views of the right foot. AP, lateral, and oblique views of the right toes FINDINGS: Extensive diffuse soft tissue swelling present throughout the entire visualized foot, ankle and into the lower leg. No well-defined wound is appreciated. No radiodense foreign body or deep/tracking soft tissue gas present. Given imaging technique and extent of soft tissue swelling, some regions are difficult to assess, particular within the region of deeper skinfolds, the posterior heel and the toes. Postoperative changes compatible with previous hardware arthrodesis with a single long threaded screw extending from the midshaft of the first metatarsal through the tarsal bones and into the talus. There is lucency surrounding the distal aspect of the screw within the metatarsal, concerning for hardware loosening. Additionally, there does not appear to be solid osseous fusion of the first TMT, medial naviculocuneiform or talonavicular joint on current imaging. Additional screw placement extending from the base of the fourth metatarsal through the cuboid and into the calcaneus present. There may be a component of osseous lucency surrounding the distal margin of the screw in the fourth metatarsal. Poorly  defined advanced arthrosis throughout the midfoot and hindfoot. Moderately sized plantar calcaneal enthesopathy. Alignment most compatible with pes planus. Additional dedicated imaging of the forefoot/toes demonstrates prior amputations of the first, second and fifth rays. Amputation margins appear well-defined with no evidence for acute osteomyelitis at the distal amputation margins. Postoperative soft tissue changes are peripheral to these findings. The distal tip of the third phalanx is poorly defined given overlapping structures and imaging technique. Chronic appearing arthrosis of the first MTP joint.   1.  Marked soft tissue swelling throughout the foot, ankle and lower leg. 2.  Postoperative hardware arthrodesis involving the first and fourth rays. Solid osseous fusion at the calcaneocuboid articulation. No definite solid osseous fusion at the tarsometatarsal or other intertarsal articulations. 3.  Chronic appearing arthrosis throughout the hindfoot and midfoot. 4.  Previous amputations of the first, second and fifth rays with no evidence for acute osteomyelitis at the amputation margins.   Echocardiogram W Colorflow Spectral Doppler Result Date: 02/04/2024 Patient Info Name:     Laura Escobar Age:     43 years DOB:     06/10/1981 Gender:     Female MRN:     [id number] Accession #:     [id number] UN Account #:     [id number] Ht:     170 cm Wt:     154 kg BSA:     2.79 m2 BP:     116 /     68 mmHg Exam Date:     02/04/2024 8:20 AM Admit Date:     02/02/2024 Exam Type:     ECHOCARDIOGRAM W COLORFLOW SPECTRAL DOPPLER Technical Quality:     Fair Staff Sonographer:     Elsie Corolla Reading Fellow:     Waddell Armin Satchel MD Ordering Physician:     Sotero Ban Study Info Indications      - GCS and GGS bacteremia,  rule out endocarditis Procedure(s)   Complete two-dimensional, color flow and Doppler transthoracic echocardiogram is performed. Summary   1. Technically difficult study.   2. The left  ventricle is normal in size with mildly increased wall thickness.   3. The left ventricular systolic function is normal, LVEF is visually estimated at > 55%.   4. The right ventricle is normal in size, with normal systolic function.   5. No apparent valvular vegetations, however sensitivity is greatly reduced due to poor patient windows. Left Ventricle   The left ventricle is normal in size with mildly increased wall thickness. The left ventricular systolic function is normal, LVEF is visually estimated at > 55%. Left ventricular diastolic function cannot be accurately assessed. Right Ventricle   The right ventricle is normal in size, with normal systolic function. Left Atrium   The left atrium is normal in size. Right Atrium   The right atrium is normal in size. Aortic Valve   The aortic valve is probably trileaflet with normal appearing leaflets with normal excursion. There is no significant aortic regurgitation. There is no evidence of a significant transvalvular gradient. Mitral Valve   The mitral valve leaflets are normal with normal leaflet mobility. There is no significant mitral valve regurgitation. There is no mitral stenosis. Tricuspid Valve   The tricuspid valve leaflets are poorly visualized but probably normal, with probably normal leaflet mobility. There is trivial tricuspid regurgitation. The pulmonary systolic pressure cannot be estimated due to insufficient TR signal. Pulmonic Valve   Pulmonary valve is not well visualized. There is no significant pulmonic regurgitation. There is no evidence of a significant transvalvular gradient. Aorta   The aorta is normal in size in the visualized segments. Inferior Vena Cava   IVC size and inspiratory change suggest normal right atrial pressure. (0-5 mmHg). Pericardium/Pleural   There is no pericardial effusion. Other Findings   Rhythm: Tachycardia. No apparent valvular vegetations, however sensitivity is greatly reduced due to poor patient windows. Ventricles  ---------------------------------------------------------------------- Name                                 Value        Normal ---------------------------------------------------------------------- LV Dimensions 2D/MM ----------------------------------------------------------------------  IVS Diastolic Thickness (2D)  1.2 cm       0.6-0.9 LVID Diastole (2D)                  3.9 cm       3.8-5.2  LVPW Diastolic Thickness (2D)                                1.2 cm       0.6-0.9 LVID Systole (2D)                   2.6 cm       2.2-3.5 LV Mass Index (2D Cubed)           58 g/m2         43-95  Relative Wall Thickness (2D)                                  0.63        <=0.42 RV Dimensions 2D/MM ----------------------------------------------------------------------  RV Basal Diastolic Dimension                           3.5 cm       2.5-4.1 TAPSE                               3.3 cm         >=1.7 Atria ---------------------------------------------------------------------- Name                                 Value        Normal ---------------------------------------------------------------------- LA Dimensions ---------------------------------------------------------------------- LA Dimension (2D)                   3.0 cm       2.7-3.8 LA Volume Index (4C A-L)        14.03 ml/m2               LA Volume Index (2C A-L)        15.37 ml/m2               LA Volume (BP MOD)                   39 ml               LA Volume Index (BP MOD)        13.96 ml/m2   16.00-34.00 RA Dimensions ---------------------------------------------------------------------- RA Area (4C)                      10.8 cm2        <=18.0 RA Area (4C) Index              3.9 cm2/m2               RA ESV Index (4C MOD)              8 ml/m2         15-27 Aortic Valve ---------------------------------------------------------------------- Name                                 Value  Normal  ---------------------------------------------------------------------- AV Doppler ---------------------------------------------------------------------- AV Peak Velocity                   1.6 m/s               AV Peak Gradient                   10 mmHg Mitral Valve ---------------------------------------------------------------------- Name                                 Value        Normal ---------------------------------------------------------------------- MV Diastolic Function ---------------------------------------------------------------------- MV E Peak Velocity                122 cm/s               MV A Peak Velocity                 88 cm/s               MV E/A                                 1.4               MV Annular TDI ---------------------------------------------------------------------- MV Septal e' Velocity             8.9 cm/s         >=8.0 MV E/e' (Septal)                      13.7               MV Lateral e' Velocity           13.2 cm/s        >=10.0 MV E/e' (Lateral)                      9.2               MV e' Average                    11.1 cm/s               MV E/e' (Average)                     11.5 Tricuspid Valve ---------------------------------------------------------------------- Name                                 Value        Normal ---------------------------------------------------------------------- Estimated PAP/RSVP ---------------------------------------------------------------------- RA Pressure                         3 mmHg           <=5 Pulmonic Valve ---------------------------------------------------------------------- Name                                 Value        Normal ---------------------------------------------------------------------- PV Doppler ---------------------------------------------------------------------- PV Peak Velocity                   1.0 m/s Aorta ---------------------------------------------------------------------- Name  Value        Normal ---------------------------------------------------------------------- Ascending Aorta ---------------------------------------------------------------------- Ao Root Diameter (2D)               3.0 cm               Ao Root Diam Index (2D)          1.1 cm/m2 Venous ---------------------------------------------------------------------- Name                                 Value        Normal ---------------------------------------------------------------------- IVC/SVC ---------------------------------------------------------------------- IVC Diameter (Exp 2D)               0.7 cm         <=2.1 Report Signatures Finalized by Fairy Beverley Seton  MD on 02/04/2024 12:27 PM Resident Waddell Armin Satchel  MD on 02/04/2024 10:30 AM  PVL Venous Duplex Lower Extremity Bilateral Result Date: 02/03/2024  Peripheral Vascular Lab     [address]   [place], [place] [address]  PVL VENOUS DUPLEX LOWER EXTREMITY BILATERAL Patient Demographics Pt. Name: KEIDY THURGOOD Location: PVL Inpatient Bedside MRN:      [id number]        Sex:      F DOB:      1980-08-30      Age:      72 years  Study Information Authorizing Provider [phone] KARI        Performed Time        02/03/2024 Name                 HACKLEY                                  10:44:38 AM Ordering Physician   SOTERO BAN       Patient Location      [facility] Accession Number     [id number] UN     Technologist          Harlene Dee                                                               RVT Diagnosis:                              Assisting                                         Technologist Ordered Reason For Exam: RLE swelling, r/o DVT Indication: RLE swelling, r/o DVT SOB Risk Factors: (obesity and sepsis). Anticoagulation: (Lovenox ). Protocol The major deep veins from the inguinal ligament to the ankle are assessed for bilaterally for compressibility and color and spectral Doppler flow characteristics. The assessed veins include bilateral  common femoral vein, femoral vein in the thigh, popliteal vein, and intramuscular calf veins. The iliac vein is assessed indirectly using Doppler waveform analysis. The great saphenous vein is assessed for compressibility at the saphenofemoral junction, and the small saphenous vein assessed for compressibility behind  the knee. Limitations: Poor ultrasound/tissue interface and body habitus.  Final Interpretation Right There is no evidence of DVT in the lower extremity. However, portions of this examination were limited- see technologist comments above. Enlarged lymph nodes visualized in the groin. Left There is no evidence of DVT in the lower extremity. However, portions of this examination were limited- see technologist comments above.  Electronically signed by [id number] Elsie Gulling on 02/03/2024 at 1:57:05 PM.  -------------------------------------------------------------------------------- Right Duplex Findings All veins visualized appear fully compressible. Doppler flow signals demonstrate normal spontaneity, phasicity, and augmentation. Not visualized segments included the calf veins visualized insegments due to limitations noted above. Ultrasound characteristics of enlarged lymph nodes are noted in the groin.  Left Duplex Findings All veins visualized appear fully compressible. Doppler flow signals demonstrate normal spontaneity, phasicity, and augmentation. Not visualized segments included the calf veins visualized in segments due to limitations noted above. Right Technical Summary No evidence of deep venous obstruction in the lower extremity. No indirect evidence of obstruction proximal to the inguinal ligament. Not visualized segments included the calf veins visualized insegments due to limitations noted above. Ultrasound characteristics of enlarged lymph nodes are noted in the groin. Left Technical Summary No evidence of deep venous obstruction in the lower extremity. No indirect evidence of obstruction  proximal to the inguinal ligament. Not visualized segments included the calf veins visualized in segments due to limitations noted above.  Final   XR Abdomen 1 View Result Date: 02/03/2024 EXAM: XR ABDOMEN 1 VIEW ACCESSION: [id number] UN REPORT DATE: 02/03/2024 12:54 PM CLINICAL INDICATION: 43 years old with NAUSEA ALONE  COMPARISON: No recent abdominal imaging TECHNIQUE: Supine view of the abdomen, 4 image(s) FINDINGS: Mild volume stool is scattered throughout the colon. Contrast is seen within a partially distended urinary bladder likely related to recent CT scan of the lower extremity. No findings for obstruction. No free air. Visualized lung bases are clear..   Mild volume stool burden. No findings for obstruction   XR Chest 1 view Result Date: 02/03/2024 EXAM: XR CHEST 1 VIEW ACCESSION: [id number] UN REPORT DATE: 02/03/2024 8:27 AM CLINICAL INDICATION: HYPOXEMIA  TECHNIQUE: Single View AP Chest Radiograph. COMPARISON: 03/10/2021 FINDINGS: Lungs are low in volume with no focal consolidation. No pleural effusion or pneumothorax. Normal heart size and mediastinal contours.   Negative chest.   ECG 12 Lead Result Date: 02/02/2024 SINUS TACHYCARDIA RIGHT ATRIAL ENLARGEMENT RIGHT AXIS DEVIATION WHEN COMPARED WITH ECG OF 23-Jun-2021 13:38, NO SIGNIFICANT CHANGE WAS FOUND Confirmed by Mazzella, Tony ([id number]) on 02/02/2024 2:36:24 PM  CT Lower Extremity Right W Contrast Result Date: 02/02/2024 EXAM: CT LOWER EXTREMITY RIGHT W CONTRAST DATE: 02/02/2024 1:26 PM ACCESSION: [id number] UN DICTATED: 02/02/2024 1:48 PM INTERPRETATION LOCATION: MAIN CAMPUS CLINICAL INDICATION: 43 years old Female with infected toe with redness to thigh  COMPARISON: 08/25/2003 TECHNIQUE: An axially-acquired helical CT scan of the right foot was obtained after administration of IV contrast. Contiguous transverse, coronal and sagittal images were reconstructed at 2-mm increments. FINDINGS: Retrograde screw arthrodesis of the first  metatarsal extending through the tarsals into the talus. There is mild lucency at the screw head and portion of the screw in the first metatarsal. Osseous fusion of the talonavicular joint. Additional retrograde screw arthrodesis of the fourth metatarsal base, cuboid, and calcaneus. Osseous fusion of the calcaneocuboid joint without pathologic perihardware lucency. No evidence for acute fracture. 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 Erosions of  the third toe distal phalanx tuft with overlying soft tissue swelling/skin irregularity. Tibiotalar and subtalar narrowing with sclerosis and osteophyte formation. Subchondral cystic change. 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 Marked diffuse soft tissue swelling. Fatty atrophy of the foot musculature. No rim-enhancing fluid collection. No tracking soft tissue gas.   Erosions at the distal aspect of the third toe distal phalanx tuft which can be seen in osteomyelitis. Postsurgical changes from retrograde screw arthrodesis. Mild lucency surrounding the screw at the first metatarsal, suspicious for mild loosening. 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 Diffuse soft tissue swelling without evidence for abscess.   ______________________________________________________________________ Discharge Instructions:          Appointments which have been scheduled for you    Feb 25, 2024 2:30 PM (Arrive by 2:15 PM) OFFICE VISIT with Warren [place], PMHNP [facility] PSYCHIATRY STEP [facility]) [address] [place] [address] [phone]     Apr 29, 2024 10:30 AM (Arrive by 10:15 AM) RETURN HAND with Cordella CHRISTELLA Hickman, MD [facility] ORTHOPAEDICS ACC [place] [facility]  REGION) [address] [place] [address] [phone]        ______________________________________________________________________ Discharge Day Services: BP 179/80   Pulse 96   Temp 36.8 C (98.2 F) (Oral)   Resp 18   Ht 170.2 cm (5' 7)   Wt (!) 157.1 kg (346 lb 5.5 oz)   LMP 07/28/2017 (Exact Date)   SpO2 100%   BMI 54.24 kg/m  Pt seen on the day of discharge and determined appropriate for discharge.  Condition at Discharge: stable  Length of Discharge: I spent greater than 30 mins in the discharge of this patient.

## 2024-02-25 ENCOUNTER — Other Ambulatory Visit: Payer: Self-pay

## 2024-02-25 ENCOUNTER — Inpatient Hospital Stay
Admission: EM | Admit: 2024-02-25 | Discharge: 2024-02-28 | DRG: 638 | Disposition: A | Discharge status: Home Health | Source: Other Acute Inpatient Hospital | Attending: Family Medicine | Admitting: Family Medicine

## 2024-02-25 ENCOUNTER — Emergency Department

## 2024-02-25 DIAGNOSIS — Z888 Allergy status to other drugs, medicaments and biological substances status: Secondary | ICD-10-CM | POA: Diagnosis not present

## 2024-02-25 DIAGNOSIS — E1151 Type 2 diabetes mellitus with diabetic peripheral angiopathy without gangrene: Secondary | ICD-10-CM | POA: Diagnosis present

## 2024-02-25 DIAGNOSIS — N1831 Chronic kidney disease, stage 3a: Secondary | ICD-10-CM | POA: Diagnosis present

## 2024-02-25 DIAGNOSIS — E1100 Type 2 diabetes mellitus with hyperosmolarity without nonketotic hyperglycemic-hyperosmolar coma (NKHHC): Principal | ICD-10-CM | POA: Diagnosis present

## 2024-02-25 DIAGNOSIS — F17290 Nicotine dependence, other tobacco product, uncomplicated: Secondary | ICD-10-CM | POA: Diagnosis present

## 2024-02-25 DIAGNOSIS — E66813 Obesity, class 3: Secondary | ICD-10-CM | POA: Diagnosis present

## 2024-02-25 DIAGNOSIS — Z833 Family history of diabetes mellitus: Secondary | ICD-10-CM | POA: Diagnosis not present

## 2024-02-25 DIAGNOSIS — E271 Primary adrenocortical insufficiency: Secondary | ICD-10-CM | POA: Diagnosis present

## 2024-02-25 DIAGNOSIS — I10 Essential (primary) hypertension: Secondary | ICD-10-CM | POA: Diagnosis not present

## 2024-02-25 DIAGNOSIS — R739 Hyperglycemia, unspecified: Secondary | ICD-10-CM | POA: Diagnosis present

## 2024-02-25 DIAGNOSIS — E1165 Type 2 diabetes mellitus with hyperglycemia: Secondary | ICD-10-CM | POA: Diagnosis present

## 2024-02-25 DIAGNOSIS — E1129 Type 2 diabetes mellitus with other diabetic kidney complication: Secondary | ICD-10-CM | POA: Diagnosis present

## 2024-02-25 DIAGNOSIS — Z79899 Other long term (current) drug therapy: Secondary | ICD-10-CM

## 2024-02-25 DIAGNOSIS — E860 Dehydration: Secondary | ICD-10-CM | POA: Diagnosis present

## 2024-02-25 DIAGNOSIS — E1169 Type 2 diabetes mellitus with other specified complication: Secondary | ICD-10-CM | POA: Diagnosis present

## 2024-02-25 DIAGNOSIS — N179 Acute kidney failure, unspecified: Secondary | ICD-10-CM | POA: Diagnosis present

## 2024-02-25 DIAGNOSIS — Z6841 Body Mass Index (BMI) 40.0 and over, adult: Secondary | ICD-10-CM | POA: Diagnosis not present

## 2024-02-25 DIAGNOSIS — E1122 Type 2 diabetes mellitus with diabetic chronic kidney disease: Secondary | ICD-10-CM | POA: Diagnosis present

## 2024-02-25 DIAGNOSIS — F32A Depression, unspecified: Secondary | ICD-10-CM | POA: Diagnosis present

## 2024-02-25 DIAGNOSIS — I5032 Chronic diastolic (congestive) heart failure: Secondary | ICD-10-CM | POA: Diagnosis present

## 2024-02-25 DIAGNOSIS — K3184 Gastroparesis: Secondary | ICD-10-CM | POA: Diagnosis present

## 2024-02-25 DIAGNOSIS — I890 Lymphedema, not elsewhere classified: Secondary | ICD-10-CM | POA: Diagnosis present

## 2024-02-25 DIAGNOSIS — E785 Hyperlipidemia, unspecified: Secondary | ICD-10-CM | POA: Diagnosis present

## 2024-02-25 DIAGNOSIS — F17210 Nicotine dependence, cigarettes, uncomplicated: Secondary | ICD-10-CM | POA: Diagnosis present

## 2024-02-25 DIAGNOSIS — M869 Osteomyelitis, unspecified: Secondary | ICD-10-CM | POA: Diagnosis not present

## 2024-02-25 DIAGNOSIS — Z91148 Patient's other noncompliance with medication regimen for other reason: Secondary | ICD-10-CM | POA: Diagnosis not present

## 2024-02-25 DIAGNOSIS — Z604 Social exclusion and rejection: Secondary | ICD-10-CM | POA: Diagnosis present

## 2024-02-25 DIAGNOSIS — E1143 Type 2 diabetes mellitus with diabetic autonomic (poly)neuropathy: Secondary | ICD-10-CM | POA: Diagnosis present

## 2024-02-25 DIAGNOSIS — M79661 Pain in right lower leg: Secondary | ICD-10-CM | POA: Diagnosis not present

## 2024-02-25 DIAGNOSIS — M86671 Other chronic osteomyelitis, right ankle and foot: Secondary | ICD-10-CM | POA: Diagnosis present

## 2024-02-25 DIAGNOSIS — M7989 Other specified soft tissue disorders: Secondary | ICD-10-CM | POA: Diagnosis present

## 2024-02-25 DIAGNOSIS — E782 Mixed hyperlipidemia: Secondary | ICD-10-CM | POA: Diagnosis not present

## 2024-02-25 DIAGNOSIS — Z89421 Acquired absence of other right toe(s): Secondary | ICD-10-CM | POA: Diagnosis not present

## 2024-02-25 DIAGNOSIS — Z794 Long term (current) use of insulin: Secondary | ICD-10-CM

## 2024-02-25 DIAGNOSIS — I130 Hypertensive heart and chronic kidney disease with heart failure and stage 1 through stage 4 chronic kidney disease, or unspecified chronic kidney disease: Secondary | ICD-10-CM | POA: Diagnosis present

## 2024-02-25 DIAGNOSIS — N183 Chronic kidney disease, stage 3 unspecified: Secondary | ICD-10-CM | POA: Diagnosis not present

## 2024-02-25 DIAGNOSIS — Z823 Family history of stroke: Secondary | ICD-10-CM

## 2024-02-25 LAB — CBC WITH DIFFERENTIAL/PLATELET
Abs Immature Granulocytes: 0.11 K/uL — ABNORMAL HIGH (ref 0.00–0.07)
Basophils Absolute: 0 K/uL (ref 0.0–0.1)
Basophils Relative: 0 %
Eosinophils Absolute: 0 K/uL (ref 0.0–0.5)
Eosinophils Relative: 0 %
HCT: 26.7 % — ABNORMAL LOW (ref 36.0–46.0)
Hemoglobin: 8.5 g/dL — ABNORMAL LOW (ref 12.0–15.0)
Immature Granulocytes: 1 %
Lymphocytes Relative: 21 %
Lymphs Abs: 2.6 K/uL (ref 0.7–4.0)
MCH: 30.1 pg (ref 26.0–34.0)
MCHC: 31.8 g/dL (ref 30.0–36.0)
MCV: 94.7 fL (ref 80.0–100.0)
Monocytes Absolute: 1 K/uL (ref 0.1–1.0)
Monocytes Relative: 8 %
Neutro Abs: 8.5 K/uL — ABNORMAL HIGH (ref 1.7–7.7)
Neutrophils Relative %: 70 %
Platelets: 472 K/uL — ABNORMAL HIGH (ref 150–400)
RBC: 2.82 MIL/uL — ABNORMAL LOW (ref 3.87–5.11)
RDW: 13.1 % (ref 11.5–15.5)
WBC: 12.3 K/uL — ABNORMAL HIGH (ref 4.0–10.5)
nRBC: 0 % (ref 0.0–0.2)

## 2024-02-25 LAB — COMPREHENSIVE METABOLIC PANEL WITH GFR
ALT: 10 U/L (ref 0–44)
AST: 15 U/L (ref 15–41)
Albumin: 2.5 g/dL — ABNORMAL LOW (ref 3.5–5.0)
Alkaline Phosphatase: 129 U/L — ABNORMAL HIGH (ref 38–126)
Anion gap: 13 (ref 5–15)
BUN: 25 mg/dL — ABNORMAL HIGH (ref 6–20)
CO2: 22 mmol/L (ref 22–32)
Calcium: 7.9 mg/dL — ABNORMAL LOW (ref 8.9–10.3)
Chloride: 87 mmol/L — ABNORMAL LOW (ref 98–111)
Creatinine, Ser: 2.28 mg/dL — ABNORMAL HIGH (ref 0.44–1.00)
GFR, Estimated: 26 mL/min — ABNORMAL LOW (ref >60)
Glucose, Bld: 743 mg/dL (ref 70–99)
Potassium: 4.3 mmol/L (ref 3.5–5.1)
Sodium: 122 mmol/L — ABNORMAL LOW (ref 135–145)
Total Bilirubin: 0.5 mg/dL (ref 0.0–1.2)
Total Protein: 8.2 g/dL — ABNORMAL HIGH (ref 6.5–8.1)

## 2024-02-25 LAB — BASIC METABOLIC PANEL WITH GFR
Anion gap: 15 (ref 5–15)
BUN: 25 mg/dL — ABNORMAL HIGH (ref 6–20)
CO2: 23 mmol/L (ref 22–32)
Calcium: 7.7 mg/dL — ABNORMAL LOW (ref 8.9–10.3)
Chloride: 85 mmol/L — ABNORMAL LOW (ref 98–111)
Creatinine, Ser: 2.14 mg/dL — ABNORMAL HIGH (ref 0.44–1.00)
GFR, Estimated: 29 mL/min — ABNORMAL LOW (ref >60)
Glucose, Bld: 689 mg/dL (ref 70–99)
Potassium: 3.9 mmol/L (ref 3.5–5.1)
Sodium: 123 mmol/L — ABNORMAL LOW (ref 135–145)

## 2024-02-25 LAB — OSMOLALITY: Osmolality: 312 mosm/kg — ABNORMAL HIGH (ref 275–295)

## 2024-02-25 LAB — LACTIC ACID, PLASMA
Lactic Acid, Venous: 2.1 mmol/L (ref 0.5–1.9)
Lactic Acid, Venous: 1.5 mmol/L (ref 0.5–1.9)

## 2024-02-25 LAB — BRAIN NATRIURETIC PEPTIDE: B Natriuretic Peptide: 22.1 pg/mL (ref 0.0–100.0)

## 2024-02-25 MED ORDER — DEXTROSE IN LACTATED RINGERS 5 % IV SOLN: INTRAVENOUS | Status: DC

## 2024-02-25 MED ORDER — METRONIDAZOLE 500 MG/100ML IV SOLN
500.0000 mg | Freq: Two times a day (BID) | INTRAVENOUS | Status: DC
  Administered 2024-02-25 – 2024-02-26 (×2): 500 mg via INTRAVENOUS
  Filled 2024-02-25 (×2): qty 100

## 2024-02-25 MED ORDER — VANCOMYCIN HCL IN DEXTROSE 1-5 GM/200ML-% IV SOLN
1000.0000 mg | Freq: Once | INTRAVENOUS | Status: AC
  Administered 2024-02-25: 1000 mg via INTRAVENOUS
  Filled 2024-02-25: qty 200

## 2024-02-25 MED ORDER — HYDRALAZINE HCL 20 MG/ML IJ SOLN
5.0000 mg | INTRAMUSCULAR | Status: DC | PRN
  Administered 2024-02-27: 5 mg via INTRAVENOUS
  Filled 2024-02-25: qty 1

## 2024-02-25 MED ORDER — SODIUM CHLORIDE 0.9 % IV SOLN
2.0000 g | INTRAVENOUS | Status: DC
  Administered 2024-02-25: 2 g via INTRAVENOUS
  Filled 2024-02-25 (×2): qty 20

## 2024-02-25 MED ORDER — LACTATED RINGERS IV SOLN: INTRAVENOUS | Status: DC

## 2024-02-25 MED ORDER — OXYCODONE-ACETAMINOPHEN 5-325 MG PO TABS
1.0000 | ORAL_TABLET | ORAL | Status: DC | PRN
  Administered 2024-02-26: 1 via ORAL
  Filled 2024-02-25 (×2): qty 1

## 2024-02-25 MED ORDER — VANCOMYCIN HCL 1500 MG/300ML IV SOLN
1500.0000 mg | Freq: Once | INTRAVENOUS | Status: AC
  Administered 2024-02-26: 1500 mg via INTRAVENOUS
  Filled 2024-02-25: qty 300

## 2024-02-25 MED ORDER — ACETAMINOPHEN 325 MG PO TABS
650.0000 mg | ORAL_TABLET | Freq: Four times a day (QID) | ORAL | Status: DC | PRN
  Administered 2024-02-27: 650 mg via ORAL
  Filled 2024-02-25: qty 2

## 2024-02-25 MED ORDER — ONDANSETRON HCL 4 MG/2ML IJ SOLN
4.0000 mg | Freq: Three times a day (TID) | INTRAMUSCULAR | Status: DC | PRN
  Administered 2024-02-25 – 2024-02-27 (×3): 4 mg via INTRAVENOUS
  Filled 2024-02-25 (×3): qty 2

## 2024-02-25 MED ORDER — INSULIN REGULAR(HUMAN) IN NACL 100-0.9 UT/100ML-% IV SOLN
INTRAVENOUS | Status: DC
  Administered 2024-02-25: 11.5 [IU]/h via INTRAVENOUS
  Filled 2024-02-25: qty 100

## 2024-02-25 MED ORDER — LACTATED RINGERS IV BOLUS
1000.0000 mL | Freq: Once | INTRAVENOUS | Status: AC
  Administered 2024-02-25: 1000 mL via INTRAVENOUS

## 2024-02-25 MED ORDER — PIPERACILLIN-TAZOBACTAM 3.375 G IVPB: 3.3750 g | Freq: Once | INTRAVENOUS | Status: DC

## 2024-02-25 MED ORDER — INSULIN ASPART 100 UNIT/ML IJ SOLN: 8.0000 [IU] | Freq: Once | INTRAMUSCULAR | Status: DC

## 2024-02-25 MED ORDER — VANCOMYCIN HCL IN DEXTROSE 1-5 GM/200ML-% IV SOLN: 1000.0000 mg | INTRAVENOUS | Status: DC

## 2024-02-25 MED ORDER — HEPARIN SODIUM (PORCINE) 5000 UNIT/ML IJ SOLN
5000.0000 [IU] | Freq: Three times a day (TID) | INTRAMUSCULAR | Status: DC
  Administered 2024-02-25 – 2024-02-28 (×8): 5000 [IU] via SUBCUTANEOUS
  Filled 2024-02-25 (×9): qty 1

## 2024-02-25 MED ORDER — POTASSIUM CHLORIDE 10 MEQ/100ML IV SOLN
10.0000 meq | INTRAVENOUS | Status: AC
  Administered 2024-02-25 – 2024-02-26 (×2): 10 meq via INTRAVENOUS
  Filled 2024-02-25 (×2): qty 100

## 2024-02-25 MED ORDER — DEXTROSE 50 % IV SOLN: 0.0000 mL | INTRAVENOUS | Status: DC | PRN

## 2024-02-25 NOTE — ED Provider Notes (Signed)
 [facility] Provider Note    Event Date/Time   First MD Initiated Contact with Patient 02/25/24 1932     (approximate)   History   Chief Complaint Post-op Problem   HPI  Laura Escobar is a 43 y.o. female with past medical history of hypertension, hyperlipidemia, diabetes, and gastroparesis who presents to the ED complaining of postop problem.  Patient underwent toe amputation at [facility] about 3 weeks ago, was subsequently admitted to rehab but signed out AMA prior to completion of antibiotics.  She did not continue antibiotics or her diabetic medications at home, since then has had increasing pain and swelling of her right foot.  She presented for follow-up with her PCP today, was referred to the ED for further evaluation.  She describes some subjective chills, but has not had any fevers.  She has not noticed any drainage from her right foot.     Physical Exam   Triage Vital Signs: ED Triage Vitals  Encounter Vitals Group     BP 02/25/24 1814 (!) 100/56     Girls Systolic BP Percentile --      Girls Diastolic BP Percentile --      Boys Systolic BP Percentile --      Boys Diastolic BP Percentile --      Pulse Rate 02/25/24 1814 (!) 102     Resp 02/25/24 1814 17     Temp 02/25/24 1814 98.3 F (36.8 C)     Temp Source 02/25/24 1814 Oral     SpO2 02/25/24 1814 100 %     Weight 02/25/24 1817 (!) 320 lb (145.2 kg)     Height 02/25/24 1816 5' 7.5 (1.715 m)     Head Circumference --      Peak Flow --      Pain Score 02/25/24 1816 10     Pain Loc --      Pain Education --      Exclude from Growth Chart --     Most recent vital signs: Vitals:   02/25/24 1814  BP: (!) 100/56  Pulse: (!) 102  Resp: 17  Temp: 98.3 F (36.8 C)  SpO2: 100%    Constitutional: Alert and oriented. Eyes: Conjunctivae are normal. Head: Atraumatic. Nose: No congestion/rhinnorhea. Mouth/Throat: Mucous membranes are moist.  Cardiovascular: Normal rate, regular rhythm.  Grossly normal heart sounds.  2+ radial pulses bilaterally. Respiratory: Normal respiratory effort.  No retractions. Lungs CTAB. Gastrointestinal: Soft and nontender. No distention. Musculoskeletal: Staples intact to right foot with no purulent drainage, but with significant erythema, edema, warmth, and tenderness around the entire foot and lower leg. Neurologic:  Normal speech and language. No gross focal neurologic deficits are appreciated.    ED Results / Procedures / Treatments   Labs (all labs ordered are listed, but only abnormal results are displayed) Labs Reviewed  CBC WITH DIFFERENTIAL/PLATELET - Abnormal; Notable for the following components:      Result Value   WBC 12.3 (*)    RBC 2.82 (*)    Hemoglobin 8.5 (*)    HCT 26.7 (*)    Platelets 472 (*)    Neutro Abs 8.5 (*)    Abs Immature Granulocytes 0.11 (*)    All other components within normal limits  COMPREHENSIVE METABOLIC PANEL WITH GFR - Abnormal; Notable for the following components:   Sodium 122 (*)    Chloride 87 (*)    Glucose, Bld 743 (*)    BUN 25 (*)  Creatinine, Ser 2.28 (*)    Calcium  7.9 (*)    Total Protein 8.2 (*)    Albumin 2.5 (*)    Alkaline Phosphatase 129 (*)    GFR, Estimated 26 (*)    All other components within normal limits  LACTIC ACID, PLASMA - Abnormal; Notable for the following components:   Lactic Acid, Venous 2.1 (*)    All other components within normal limits  CULTURE, BLOOD (ROUTINE X 2)  CULTURE, BLOOD (ROUTINE X 2)  LACTIC ACID, PLASMA  BRAIN NATRIURETIC PEPTIDE    RADIOLOGY Right foot x-ray reviewed and interpreted by me with bony breakdown concerning for osteomyelitis.  PROCEDURES:  Critical Care performed: No  Procedures   MEDICATIONS ORDERED IN ED: Medications  lactated ringers  bolus 1,000 mL (has no administration in time range)  insulin  aspart (novoLOG ) injection 8 Units (has no administration in time range)  vancomycin  (VANCOCIN ) IVPB 1000 mg/200 mL  premix (has no administration in time range)  ondansetron  (ZOFRAN ) injection 4 mg (has no administration in time range)  hydrALAZINE  (APRESOLINE ) injection 5 mg (has no administration in time range)  acetaminophen  (TYLENOL ) tablet 650 mg (has no administration in time range)  cefTRIAXone  (ROCEPHIN ) 2 g in sodium chloride  0.9 % 100 mL IVPB (has no administration in time range)  metroNIDAZOLE  (FLAGYL ) IVPB 500 mg (has no administration in time range)  oxyCODONE -acetaminophen  (PERCOCET/ROXICET) 5-325 MG per tablet 1 tablet (has no administration in time range)     IMPRESSION / MDM / ASSESSMENT AND PLAN / ED COURSE  I reviewed the triage vital signs and the nursing notes.                              43 y.o. female with past medical history of hypertension, hyperlipidemia, diabetes, and gastroparesis who presents to the ED with worsening pain and swelling of her right foot for the past week following recent treatment for osteomyelitis.  Patient's presentation is most consistent with acute presentation with potential threat to life or bodily function.  Differential diagnosis includes, but is not limited to, sepsis, osteomyelitis, hyperglycemia, DKA, HHS, electrolyte abnormality, AKI.  Patient nontoxic-appearing and in no acute distress, vital signs remarkable for tachycardia but otherwise reassuring.  She does have worsening swelling and obvious cellulitis to her right foot, x-ray concerning for osteomyelitis and we will start on broad-spectrum antibiotics.  Patient does meet sepsis criteria with leukocytosis and mild tachycardia, she also has mild elevation in lactic acid and we will give IV fluid bolus and trend.  She has an AKI as well as hyperglycemia, no evidence of DKA or HHS.  Patient offered transfer back to [facility], but declines and wishes to be admitted at [facility], has previously seen Dr. Ashley of podiatry.  Case discussed with hospitalist for admission.      FINAL CLINICAL IMPRESSION(S) /  ED DIAGNOSES   Final diagnoses:  Osteomyelitis of right foot, unspecified type (HCC)  Hyperglycemia     Rx / DC Orders   ED Discharge Orders     None        Note:  This document was prepared using Dragon voice recognition software and may include unintentional dictation errors.   Willo Dunnings, MD 02/25/24 2034

## 2024-02-25 NOTE — ED Notes (Signed)
 Critical Results: Glucose 743 Lactic Acid 2.1  Willo, MD aware

## 2024-02-25 NOTE — H&P (Signed)
 History and Physical    [name] FMW:[id number] DOB: [birth year] DOA: 02/25/2024  Referring MD/NP/PA:   PCP: Steva Clotilda DEL, NP   Patient coming from:  The patient is coming from home.     Chief Complaint: Right foot pain and swelling  HPI: Laura Escobar is a 43 y.o. female with medical history significant of poorly controlled DM, diabetic foot osteomyelitis, hypertension, hyperlipidemia, dCHF, depression, bipolar, CKD-3, Addison's disease, gastroparesis, morbid obesity with BMI 49.38, who presents with right foot pain and swelling.  Patient was recently hospitalized to [facility] due to toe osteomyelitis and bacteremia on 02/02/2024; pt is s/p of right third toe amputation on 6/1. Per discharge summary, Consulted ID for antibiotic assistance. Based on OR cultures sensitivities showing staph dysgalactiae and methicillin susceptible Staph aureus, we will complete a treatment course through 7/1 with cefazolin  while inpatient and switch to linezolid on discharge (clean margins without growth.   Pt was subsequently admitted to rehab but signed out AMA prior to completion of antibiotics. She did not continue antibiotics at home.  She states that she has worsening right foot pain and swelling. Her right foot pain is constant, sharp, moderate to severe, nonradiating.  She also has asymmetric right right leg swelling.  No fever or chills.  Patient had a following up with PCP today, and was referred to ED for further evaluation and treatment.  Patient does not have fever or chills.  No chest pain, cough, SOB.  She has nausea, no vomiting, diarrhea or abdominal pain.  No symptoms of UTI.  Denies rectal bleeding or dark stool.  Data reviewed independently and ED Course: pt was found to have WBC 12.3, lactic acid 2.1, blood sugar 743 with anion gap 13, pseudohyponatremia, stable renal function, temperature normal, blood pressure 100/56, heart rate of 102, RR 17, oxygen saturation 100% on room  air.  Patient is admitted to stepdown as inpatient.  Consulted Dr. Ashley of podiatry.   X-ray of right foot: 1. Subtle lucency of the third metatarsal head, which may represent osteomyelitis. 2. Postsurgical changes of first, second, third, and fifth toe amputations. 3. Similar uniform lucency surrounding the distal threaded portions of the first and fourth metatarsal rods, which may be seen in the setting of loosening.    EKG: I have personally reviewed.  Sinus rhythm, unstable baseline, no ischemic change.   Review of Systems:   General: no fevers, chills, no body weight gain, has fatigue HEENT: no blurry vision, hearing changes or sore throat Respiratory: no dyspnea, coughing, wheezing CV: no chest pain, no palpitations GI: has nausea, no vomiting, abdominal pain, diarrhea, constipation GU: no dysuria, burning on urination, increased urinary frequency, hematuria  Ext: has right leg leg edema, right foot swelling and pain Neuro: no unilateral weakness, numbness, or tingling, no vision change or hearing loss Skin: no rash, no skin tear. MSK: No muscle spasm, no deformity, no limitation of range of movement in spin Heme: No easy bruising.  Travel history: No recent long distant travel.   Allergy:  Allergies  Allergen Reactions   Amlodipine  Anaphylaxis and Swelling   Metformin And Related    Olanzapine Anxiety    Pulled hair out    Past Medical History:  Diagnosis Date   Addison disease (HCC) 2011   Bipolar 1 disorder (HCC) 2011   Charcot foot due to diabetes mellitus (HCC)    Diabetes mellitus without complication (HCC) 1998   Gastroparesis    Hyperlipidemia    Hypertension  Ketoacidosis    Marijuana abuse    PONV (postoperative nausea and vomiting)     Past Surgical History:  Procedure Laterality Date   ABCESS DRAINAGE N/A    Buttocks 2011, right groin 2013   ABDOMINAL HYSTERECTOMY     AMPUTATION TOE Right 06/24/2015   Procedure: AMPUTATION TOE;  Surgeon:  Eva Gay, DPM;  Location: [facility] ORS;  Service: Podiatry;  Laterality: Right;   AMPUTATION TOE Right 09/06/2021   Procedure: AMPUTATION 5th TOE;  Surgeon: Gay Eva, DPM;  Location: [facility] ORS;  Service: Podiatry;  Laterality: Right;   AMPUTATION TOE Right 11/15/2023   Procedure: AMPUTATION, TOE;  Surgeon: Gay Eva, DPM;  Location: [facility] ORS;  Service: Orthopedics/Podiatry;  Laterality: Right;    Social History:  reports that she has quit smoking. Her smoking use included cigarettes. She has never used smokeless tobacco. She reports current drug use. Drug: Marijuana. She reports that she does not drink alcohol.  Family History:  Family History  Problem Relation Age of Onset   Diabetes Mother    Stroke Mother    Healthy Father      Prior to Admission medications   Medication Sig Start Date End Date Taking? Authorizing Provider  Aspirin-Acetaminophen -Caffeine (GOODY HEADACHE PO) Take 1 packet by mouth as needed.    [provider]  atorvastatin  (LIPITOR) 20 MG tablet Take 10 mg by mouth daily. 02/17/21   [provider]  dimenhyDRINATE (DRAMAMINE) 50 MG tablet Take 50 mg by mouth daily as needed for nausea.    [provider]  FLUoxetine  (PROZAC ) 20 MG capsule Take 20 mg by mouth daily.    [provider]  ibuprofen  (ADVIL ) 200 MG tablet Take 600 mg by mouth every 4 (four) hours as needed for moderate pain (pain score 4-6) or mild pain (pain score 1-3).    [provider]  insulin  aspart protamine - aspart (NOVOLOG  MIX 70/30 FLEXPEN) (70-30) 100 UNIT/ML FlexPen Inject 75 Units into the skin 2 (two) times daily with a meal. Patient taking differently: Inject 90 Units into the skin 2 (two) times daily with a meal. 09/07/21 11/15/23  Awanda City, MD  losartan  (COZAAR ) 100 MG tablet Take 50 mg by mouth daily.    [provider]  metolazone  (ZAROXOLYN ) 2.5 MG tablet Take 2.5 mg by mouth every other day.    [provider]   triamterene-hydrochlorothiazide (MAXZIDE-25) 37.5-25 MG tablet Take 1 tablet by mouth daily.    [provider]    Physical Exam: Vitals:   02/25/24 1816 02/25/24 1817 02/25/24 2200 02/26/24 0100  BP:   (!) 182/78 (!) 186/88  Pulse:   (!) 104 95  Resp:   (!) 25 (!) 22  Temp:      TempSrc:      SpO2:   100% 100%  Weight:  (!) 145.2 kg    Height: 5' 7.5 (1.715 m)      General: Not in acute distress.  Dry mucous membrane HEENT:       Eyes: PERRL, EOMI, no jaundice       ENT: No discharge from the ears and nose, no pharynx injection, no tonsillar enlargement.        Neck: No JVD, no bruit, no mass felt. Heme: No neck lymph node enlargement. Cardiac: S1/S2, RRR, No murmurs, No gallops or rubs. Respiratory: No rales, wheezing, rhonchi or rubs. GI: Soft, nondistended, nontender, no rebound pain, no organomegaly, BS present. GU: No hematuria Ext: has 1+ right lower leg edema,  s/p of recent third right toe amputation with staples in place, no active drainage, has swelling, tenderness and warmth.    Musculoskeletal: No joint deformities, No joint redness or warmth, no limitation of ROM in spin. Skin: No rashes.  Neuro: Alert, oriented X3, cranial nerves II-XII grossly intact, moves all extremities normally Psych: Patient is not psychotic, no suicidal or hemocidal ideation.  Labs on Admission: I have personally reviewed following labs and imaging studies  CBC: Recent Labs  Lab 02/25/24 1818  WBC 12.3*  NEUTROABS 8.5*  HGB 8.5*  HCT 26.7*  MCV 94.7  PLT 472*   Basic Metabolic Panel: Recent Labs  Lab 02/25/24 1818 02/25/24 2322  NA 122* 123*  K 4.3 3.9  CL 87* 85*  CO2 22 23  GLUCOSE 743* 689*  BUN 25* 25*  CREATININE 2.28* 2.14*  CALCIUM  7.9* 7.7*   GFR: Estimated Creatinine Clearance: 50.7 mL/min (A) (by C-G formula based on SCr of 2.14 mg/dL (H)). Liver Function Tests: Recent Labs  Lab 02/25/24 1818  AST 15  ALT 10  ALKPHOS 129*  BILITOT 0.5   PROT 8.2*  ALBUMIN 2.5*   No results for input(s): LIPASE, AMYLASE in the last 168 hours. No results for input(s): AMMONIA in the last 168 hours. Coagulation Profile: No results for input(s): INR, PROTIME in the last 168 hours. Cardiac Enzymes: No results for input(s): CKTOTAL, CKMB, CKMBINDEX, TROPONINI in the last 168 hours. BNP (last 3 results) No results for input(s): PROBNP in the last 8760 hours. HbA1C: No results for input(s): HGBA1C in the last 72 hours. CBG: No results for input(s): GLUCAP in the last 168 hours. Lipid Profile: No results for input(s): CHOL, HDL, LDLCALC, TRIG, CHOLHDL, LDLDIRECT in the last 72 hours. Thyroid Function Tests: No results for input(s): TSH, T4TOTAL, FREET4, T3FREE, THYROIDAB in the last 72 hours. Anemia Panel: No results for input(s): VITAMINB12, FOLATE, FERRITIN, TIBC, IRON, RETICCTPCT in the last 72 hours. Urine analysis:    Component Value Date/Time   COLORURINE YELLOW 09/06/2021 0117   APPEARANCEUR CLEAR 09/06/2021 0117   APPEARANCEUR CLEAR 08/18/2012 1504   LABSPEC 1.015 09/06/2021 0117   LABSPEC 1.020 08/18/2012 1504   PHURINE 5.5 09/06/2021 0117   GLUCOSEU >1,000 (A) 09/06/2021 0117   GLUCOSEU >/= 2000 mg/dL 87/69/7986 8495   HGBUR NEGATIVE 09/06/2021 0117   BILIRUBINUR NEGATIVE 09/06/2021 0117   BILIRUBINUR NEGATIVE 08/18/2012 1504   KETONESUR TRACE (A) 09/06/2021 0117   PROTEINUR NEGATIVE 09/06/2021 0117   NITRITE NEGATIVE 09/06/2021 0117   LEUKOCYTESUR NEGATIVE 09/06/2021 0117   LEUKOCYTESUR NEGATIVE 08/18/2012 1504   Sepsis Labs: @LABRCNTIP (procalcitonin:4,lacticidven:4) )No results found for this or any previous visit (from the past 240 hours).   Radiological Exams on Admission:   Assessment/Plan Principal Problem:   Hyperosmolar hyperglycemic state (HHS) (HCC) Active Problems:   Type II diabetes mellitus with renal manifestations (HCC)   Foot  osteomyelitis, right (HCC)   Essential hypertension   Hyperlipidemia   Chronic diastolic CHF (congestive heart failure) (HCC)   Acute renal failure superimposed on stage 3a chronic kidney disease (HCC)   Pain and swelling of lower leg, right   Addison's disease (HCC)   Obesity, Class III, BMI 40-49.9 (morbid obesity)   Assessment and Plan:   Hyperosmolar hyperglycemic state (HHS) and type II diabetes mellitus with renal manifestations (HCC): Recent A1c 10.9, poorly controlled.  Patient is supposed to take 70/30 insulin  90 units twice daily, but patient seems not taking insulin  currently.  Developed HHS with blood sugar 741  and a normal anion gap at 13.  Bicarbonate of 22, no DKA.  Mental status normal.  - Admit to stepdown as inpt - IVF:  1L of NS bolus - start insulin  drip with BMP q4h - IVF: LR at 125 cc/h, will switch to D5-LR at 125 cc/h when CBG<250 - replete K as needed - Zofran  prn nausea  - NPO  - consult to diabetic educator  Foot osteomyelitis, right [facility]): s/p of recent third toe amputation, but did not complete antibiotic treatment.  Has worsening pain and swelling in right foot.  WBC 12.3, no fever.  Lactic acid elevated 2.1 which is likely due to dehydration secondary to HHS.  Clinically does not seem to have sepsis.  X-ray showed subtle lucency of the third metatarsal head, which may represent osteomyelitis.  - Empiric antimicrobial treatment with vancomycin  and Rocephin , and flagyl   - PRN Zofran  for nausea, and Tylenol , Percocet for pain - Blood cultures x 2  - ESR and CRP - wound care consult - Security message was sent to Dr. Ashley of podiatry for consult  Essential hypertension: Blood pressure soft 100/56 -Hold off blood pressure medications: Maxzide, metolazone , Cozaar  - IV hydralazine  as needed  Hyperlipidemia -Lipitor  Chronic diastolic CHF (congestive heart failure) (HCC): 2D echo on 02/04/2024 showed EF> 55%.  Clinically patient is dry. -Hold  metolazone  and Maxizde -check BNP  Acute renal failure superimposed on stage 3a chronic kidney disease (HCC): He had recent admission in [facility], patient had worsening renal function with creatinine up to 3.4 on 02/07/2024, which improved to 2.97 on 02/13/2024.  Today her creatinine is 2.28, BUN 25, which has improved from up recent level. - Follow-up with BMP  Pain and swelling of lower leg, right -f/u LE doppler of right leg to r/o DVT  Hx of Addison's disease (HCC) -give 100 mg of Solu-Cortef  as stress dose   Obesity, Class III, BMI 40-49.9 (morbid obesity): Patient has Obesity Class III, with body weight 145.2 Kg and BMI 49.38 kg/m2.  - Encourage losing weight - Exercise and healthy diet       DVT ppx: SQ Heparin     Code Status: Full code     Family Communication:     not done, no family member is at bed side.    Disposition Plan:  Anticipate discharge back to previous environment  Consults called:  Consulted Dr. Ashley of podiatry.  Admission status and Level of care: Stepdown:   as inpt        Dispo: The patient is from: Home              Anticipated d/c is to: Home              Anticipated d/c date is: 2 days              Patient currently is not medically stable to d/c.    Severity of Illness:  The appropriate patient status for this patient is INPATIENT. Inpatient status is judged to be reasonable and necessary in order to provide the required intensity of service to ensure the patient's safety. The patient's presenting symptoms, physical exam findings, and initial radiographic and laboratory data in the context of their chronic comorbidities is felt to place them at high risk for further clinical deterioration. Furthermore, it is not anticipated that the patient will be medically stable for discharge from the hospital within 2 midnights of admission.   * I certify that at the point of admission it  is my clinical judgment that the patient will require inpatient  hospital care spanning beyond 2 midnights from the point of admission due to high intensity of service, high risk for further deterioration and high frequency of surveillance required.*       Date of Service 02/26/2024    Caleb Exon [facility]   If 7PM-7AM, please contact night-coverage [email or web address] 02/26/2024, 1:43 AM

## 2024-02-25 NOTE — Consult Note (Signed)
 Pharmacy Antibiotic Note  Laura Escobar is a 43 y.o. female admitted on 02/25/2024 with OM.  Pharmacy has been consulted for vancomycin  dosing. Lactic acid 2.1, WBC 12, afeb. Scr is at baseline. Hx of Staph lugdunensis in wound cx 10/2023.   Imaging:  1. Subtle lucency of the third metatarsal head, which may represent osteomyelitis. 2. Postsurgical changes of first, second, third, and fifth toe amputations. 3. Similar uniform lucency surrounding the distal threaded portions of the first and fourth metatarsal rods, which may be seen in the setting of loosening.  Plan: Pt received vancomycin  1000 mg x 1 in the ED. Will give vancomycin  1500 mg x 1 for a total of 2500 mg loading dose. Will start a maintenance dose of 1000 mg q24H. Predicted AUC of 454. Goal AUC of 400-550. Scr 2.28, IBW, Vd 0.5. Plan to obtain vancomycin  levels after the 4th or 5th dose, or if clinically deem appropriate.   Continue ceftriaxone  2 g daily   Continue flagyl  500 mg IV BID.   Height: 5' 7.5 (171.5 cm) Weight: (!) 145.2 kg (320 lb) IBW/kg (Calculated) : 62.75  Temp (24hrs), Avg:98.3 F (36.8 C), Min:98.3 F (36.8 C), Max:98.3 F (36.8 C)  Recent Labs  Lab 02/25/24 1818 02/25/24 1819  WBC 12.3*  --   CREATININE 2.28*  --   LATICACIDVEN  --  2.1*    Estimated Creatinine Clearance: 47.6 mL/min (A) (by C-G formula based on SCr of 2.28 mg/dL (H)).    Allergies  Allergen Reactions   Amlodipine  Anaphylaxis and Swelling   Metformin And Related    Olanzapine Anxiety    Pulled hair out    Antimicrobials this admission: 7/8 vancomycin  >>  7/8 flagyl  >>  7/8 ceftriaxone  >>   Dose adjustments this admission: None  Microbiology results: None.   Thank you for allowing pharmacy to be a part of this patient's care.  Cathaleen GORMAN Blanch, PharmD, BCPS 02/25/2024 8:26 PM

## 2024-02-25 NOTE — ED Notes (Signed)
 Pt wheeled to rm 19. BP cuff and pulse ox placed. Call light within reach. Pt has no further needs at this time. Family at bedside.
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
## 2024-02-25 NOTE — ED Triage Notes (Signed)
 Pt had toe amputation from R foot at [facility] last Thursday. Pt had staph and strep infections and received IV abx while there. Pt had check up with PCP today and was told to come to the hospital for admission. She stopped the antibiotic early due to leaving rehab and the leg has been swollen since before surgery.

## 2024-02-25 NOTE — ED Notes (Signed)
 Critical glucose 689 Provider informed

## 2024-02-26 ENCOUNTER — Inpatient Hospital Stay

## 2024-02-26 DIAGNOSIS — M869 Osteomyelitis, unspecified: Secondary | ICD-10-CM

## 2024-02-26 DIAGNOSIS — I10 Essential (primary) hypertension: Secondary | ICD-10-CM

## 2024-02-26 DIAGNOSIS — E1122 Type 2 diabetes mellitus with diabetic chronic kidney disease: Secondary | ICD-10-CM

## 2024-02-26 DIAGNOSIS — N179 Acute kidney failure, unspecified: Secondary | ICD-10-CM

## 2024-02-26 DIAGNOSIS — E782 Mixed hyperlipidemia: Secondary | ICD-10-CM

## 2024-02-26 DIAGNOSIS — E66813 Obesity, class 3: Secondary | ICD-10-CM

## 2024-02-26 DIAGNOSIS — Z794 Long term (current) use of insulin: Secondary | ICD-10-CM

## 2024-02-26 DIAGNOSIS — E1100 Type 2 diabetes mellitus with hyperosmolarity without nonketotic hyperglycemic-hyperosmolar coma (NKHHC): Secondary | ICD-10-CM

## 2024-02-26 DIAGNOSIS — M7989 Other specified soft tissue disorders: Secondary | ICD-10-CM

## 2024-02-26 DIAGNOSIS — N1831 Chronic kidney disease, stage 3a: Secondary | ICD-10-CM

## 2024-02-26 DIAGNOSIS — E271 Primary adrenocortical insufficiency: Secondary | ICD-10-CM

## 2024-02-26 DIAGNOSIS — M79661 Pain in right lower leg: Secondary | ICD-10-CM

## 2024-02-26 DIAGNOSIS — I5032 Chronic diastolic (congestive) heart failure: Secondary | ICD-10-CM

## 2024-02-26 DIAGNOSIS — N183 Chronic kidney disease, stage 3 unspecified: Secondary | ICD-10-CM

## 2024-02-26 LAB — CBC
HCT: 26.6 % — ABNORMAL LOW (ref 36.0–46.0)
Hemoglobin: 8.6 g/dL — ABNORMAL LOW (ref 12.0–15.0)
MCH: 30.2 pg (ref 26.0–34.0)
MCHC: 32.3 g/dL (ref 30.0–36.0)
MCV: 93.3 fL (ref 80.0–100.0)
Platelets: 385 K/uL (ref 150–400)
RBC: 2.85 MIL/uL — ABNORMAL LOW (ref 3.87–5.11)
RDW: 13 % (ref 11.5–15.5)
WBC: 12.6 K/uL — ABNORMAL HIGH (ref 4.0–10.5)
nRBC: 0 % (ref 0.0–0.2)

## 2024-02-26 LAB — BASIC METABOLIC PANEL WITH GFR
Anion gap: 12 (ref 5–15)
Anion gap: 11 (ref 5–15)
Anion gap: 14 (ref 5–15)
BUN: 22 mg/dL — ABNORMAL HIGH (ref 6–20)
BUN: 23 mg/dL — ABNORMAL HIGH (ref 6–20)
BUN: 25 mg/dL — ABNORMAL HIGH (ref 6–20)
CO2: 26 mmol/L (ref 22–32)
CO2: 25 mmol/L (ref 22–32)
CO2: 24 mmol/L (ref 22–32)
Calcium: 8 mg/dL — ABNORMAL LOW (ref 8.9–10.3)
Calcium: 8.1 mg/dL — ABNORMAL LOW (ref 8.9–10.3)
Calcium: 7.9 mg/dL — ABNORMAL LOW (ref 8.9–10.3)
Chloride: 92 mmol/L — ABNORMAL LOW (ref 98–111)
Chloride: 93 mmol/L — ABNORMAL LOW (ref 98–111)
Chloride: 92 mmol/L — ABNORMAL LOW (ref 98–111)
Creatinine, Ser: 2.01 mg/dL — ABNORMAL HIGH (ref 0.44–1.00)
Creatinine, Ser: 1.85 mg/dL — ABNORMAL HIGH (ref 0.44–1.00)
Creatinine, Ser: 1.81 mg/dL — ABNORMAL HIGH (ref 0.44–1.00)
GFR, Estimated: 31 mL/min — ABNORMAL LOW
GFR, Estimated: 34 mL/min — ABNORMAL LOW
GFR, Estimated: 35 mL/min — ABNORMAL LOW (ref >60)
Glucose, Bld: 231 mg/dL — ABNORMAL HIGH (ref 70–99)
Glucose, Bld: 213 mg/dL — ABNORMAL HIGH (ref 70–99)
Glucose, Bld: 283 mg/dL — ABNORMAL HIGH (ref 70–99)
Potassium: 3.6 mmol/L (ref 3.5–5.1)
Potassium: 4.2 mmol/L (ref 3.5–5.1)
Potassium: 4 mmol/L (ref 3.5–5.1)
Sodium: 130 mmol/L — ABNORMAL LOW (ref 135–145)
Sodium: 129 mmol/L — ABNORMAL LOW (ref 135–145)
Sodium: 130 mmol/L — ABNORMAL LOW (ref 135–145)

## 2024-02-26 LAB — GLUCOSE, CAPILLARY
Glucose-Capillary: 293 mg/dL — ABNORMAL HIGH (ref 70–99)
Glucose-Capillary: 240 mg/dL — ABNORMAL HIGH (ref 70–99)

## 2024-02-26 LAB — CBG MONITORING, ED
Glucose-Capillary: 400 mg/dL — ABNORMAL HIGH (ref 70–99)
Glucose-Capillary: 228 mg/dL — ABNORMAL HIGH (ref 70–99)
Glucose-Capillary: 197 mg/dL — ABNORMAL HIGH (ref 70–99)
Glucose-Capillary: 195 mg/dL — ABNORMAL HIGH (ref 70–99)
Glucose-Capillary: 283 mg/dL — ABNORMAL HIGH (ref 70–99)

## 2024-02-26 LAB — HEMOGLOBIN A1C
Hgb A1c MFr Bld: 10.6 % — ABNORMAL HIGH (ref 4.8–5.6)
Mean Plasma Glucose: 258 mg/dL

## 2024-02-26 LAB — HIV ANTIBODY (ROUTINE TESTING W REFLEX): HIV Screen 4th Generation wRfx: NONREACTIVE

## 2024-02-26 LAB — C-REACTIVE PROTEIN: CRP: 7.8 mg/dL — ABNORMAL HIGH (ref <1.0)

## 2024-02-26 LAB — SEDIMENTATION RATE: Sed Rate: 125 mm/h — ABNORMAL HIGH (ref 0–20)

## 2024-02-26 MED ORDER — DICLOFENAC SODIUM 1 % EX GEL: 2.0000 g | Freq: Two times a day (BID) | CUTANEOUS | Status: DC | PRN

## 2024-02-26 MED ORDER — DOXEPIN HCL 10 MG PO CAPS
10.0000 mg | ORAL_CAPSULE | Freq: Every day | ORAL | Status: DC
  Administered 2024-02-26 – 2024-02-27 (×2): 10 mg via ORAL
  Filled 2024-02-26 (×2): qty 1

## 2024-02-26 MED ORDER — METOLAZONE 2.5 MG PO TABS
2.5000 mg | ORAL_TABLET | ORAL | Status: DC
  Administered 2024-02-26 – 2024-02-28 (×2): 2.5 mg via ORAL
  Filled 2024-02-26 (×2): qty 1

## 2024-02-26 MED ORDER — INSULIN ASPART 100 UNIT/ML IJ SOLN
0.0000 [IU] | Freq: Every day | INTRAMUSCULAR | Status: DC
  Administered 2024-02-26 – 2024-02-27 (×2): 2 [IU] via SUBCUTANEOUS
  Filled 2024-02-26 (×2): qty 1

## 2024-02-26 MED ORDER — ATORVASTATIN CALCIUM 20 MG PO TABS
20.0000 mg | ORAL_TABLET | Freq: Every day | ORAL | Status: DC
  Administered 2024-02-26 – 2024-02-28 (×3): 20 mg via ORAL
  Filled 2024-02-26 (×3): qty 1

## 2024-02-26 MED ORDER — TORSEMIDE 20 MG PO TABS
20.0000 mg | ORAL_TABLET | Freq: Every day | ORAL | Status: DC
  Administered 2024-02-26 – 2024-02-28 (×3): 20 mg via ORAL
  Filled 2024-02-26 (×3): qty 1

## 2024-02-26 MED ORDER — SENNA 8.6 MG PO TABS: 2.0000 | ORAL_TABLET | Freq: Every evening | ORAL | Status: DC | PRN

## 2024-02-26 MED ORDER — NICOTINE 14 MG/24HR TD PT24
14.0000 mg | MEDICATED_PATCH | Freq: Every day | TRANSDERMAL | Status: DC
  Administered 2024-02-26 – 2024-02-28 (×3): 14 mg via TRANSDERMAL
  Filled 2024-02-26 (×3): qty 1

## 2024-02-26 MED ORDER — VANCOMYCIN HCL 1250 MG/250ML IV SOLN
1250.0000 mg | INTRAVENOUS | Status: DC
  Filled 2024-02-26: qty 250

## 2024-02-26 MED ORDER — FLUOXETINE HCL 20 MG PO CAPS
20.0000 mg | ORAL_CAPSULE | Freq: Every day | ORAL | Status: DC
  Administered 2024-02-26 – 2024-02-28 (×3): 20 mg via ORAL
  Filled 2024-02-26 (×3): qty 1

## 2024-02-26 MED ORDER — METOLAZONE 2.5 MG PO TABS: 2.5000 mg | ORAL_TABLET | ORAL | Status: DC

## 2024-02-26 MED ORDER — PANTOPRAZOLE SODIUM 40 MG PO TBEC
40.0000 mg | DELAYED_RELEASE_TABLET | Freq: Every day | ORAL | Status: DC
  Administered 2024-02-26 – 2024-02-28 (×3): 40 mg via ORAL
  Filled 2024-02-26 (×4): qty 1

## 2024-02-26 MED ORDER — LOSARTAN POTASSIUM 50 MG PO TABS: 50.0000 mg | ORAL_TABLET | Freq: Every day | ORAL | Status: DC

## 2024-02-26 MED ORDER — INSULIN ASPART 100 UNIT/ML IJ SOLN
0.0000 [IU] | Freq: Three times a day (TID) | INTRAMUSCULAR | Status: DC
  Administered 2024-02-26 – 2024-02-27 (×3): 11 [IU] via SUBCUTANEOUS
  Administered 2024-02-27: 20 [IU] via SUBCUTANEOUS
  Administered 2024-02-27: 3 [IU] via SUBCUTANEOUS
  Administered 2024-02-28: 15 [IU] via SUBCUTANEOUS
  Administered 2024-02-28: 20 [IU] via SUBCUTANEOUS
  Filled 2024-02-26 (×7): qty 1

## 2024-02-26 MED ORDER — POLYETHYLENE GLYCOL 3350 17 G PO PACK
17.0000 g | PACK | Freq: Every day | ORAL | Status: DC
  Administered 2024-02-27: 17 g via ORAL
  Filled 2024-02-26: qty 1

## 2024-02-26 MED ORDER — HYDROCORTISONE SOD SUC (PF) 100 MG IJ SOLR
100.0000 mg | Freq: Once | INTRAMUSCULAR | Status: AC
  Administered 2024-02-26: 100 mg via INTRAVENOUS
  Filled 2024-02-26: qty 2

## 2024-02-26 MED ORDER — INSULIN ASPART 100 UNIT/ML IJ SOLN
6.0000 [IU] | Freq: Three times a day (TID) | INTRAMUSCULAR | Status: DC
  Administered 2024-02-26 – 2024-02-28 (×6): 6 [IU] via SUBCUTANEOUS
  Filled 2024-02-26 (×7): qty 1

## 2024-02-26 MED ORDER — INSULIN GLARGINE-YFGN 100 UNIT/ML ~~LOC~~ SOLN
20.0000 [IU] | Freq: Every day | SUBCUTANEOUS | Status: DC
  Administered 2024-02-26: 20 [IU] via SUBCUTANEOUS
  Filled 2024-02-26 (×2): qty 0.2

## 2024-02-26 MED ORDER — LORAZEPAM 1 MG PO TABS
1.0000 mg | ORAL_TABLET | Freq: Three times a day (TID) | ORAL | Status: DC | PRN
  Administered 2024-02-27 (×2): 1 mg via ORAL
  Filled 2024-02-26 (×2): qty 1

## 2024-02-26 NOTE — Consult Note (Signed)
 Pharmacy Antibiotic Note  Laura Escobar is a 43 y.o. female admitted on 02/25/2024 with R foot infection/? OM.  Pharmacy has been consulted for vancomycin  dosing.  Hx of Staph lugdunensis in wound cx 10/2023.   -no purulence per Podiatry  Imaging:  1. Subtle lucency of the third metatarsal head, which may represent osteomyelitis. 2. Postsurgical changes of first, second, third, and fifth toe amputations. 3. Similar uniform lucency surrounding the distal threaded portions of the first and fourth metatarsal rods, which may be seen in the setting of loosening.  Plan: Scr 2.28>> 2.14>>2.01>>1.85    Will adjust Vancomycin  dose from 1000 mg to 1250 mg q24H.  Goal AUC 400-550. Expected AUC: 474 Cmin 13.1 SCr used: 1.85   Plan to obtain vancomycin  levels after the 4th or 5th dose, or if clinically deem appropriate.   Continue ceftriaxone  2 g daily   Continue flagyl  500 mg IV BID.   Height: 5' 7.5 (171.5 cm) Weight: (!) 145.2 kg (320 lb) IBW/kg (Calculated) : 62.75  Temp (24hrs), Avg:98.4 F (36.9 C), Min:98 F (36.7 C), Max:98.6 F (37 C)  Recent Labs  Lab 02/25/24 1818 02/25/24 1819 02/25/24 2322 02/26/24 0509 02/26/24 0908  WBC 12.3*  --   --  12.6*  --   CREATININE 2.28*  --  2.14* 2.01* 1.85*  LATICACIDVEN  --  2.1* 1.5  --   --     Estimated Creatinine Clearance: 58.7 mL/min (A) (by C-G formula based on SCr of 1.85 mg/dL (H)).    Allergies  Allergen Reactions   Amlodipine  Anaphylaxis and Swelling   Metformin And Related    Olanzapine Anxiety    Pulled hair out    Antimicrobials this admission: 7/8 vancomycin  >>  7/8 flagyl  >>  7/8 ceftriaxone  >>   Dose adjustments this admission:   Microbiology results: 7/8 Bcx: NG<12 hrs  Thank you for allowing pharmacy to be a part of this patient's care.  Ellyssa Zagal A, PharmD 02/26/2024 3:35 PM

## 2024-02-26 NOTE — Inpatient Diabetes Management (Addendum)
 Inpatient Diabetes Program Recommendations  AACE/ADA: New Consensus Statement on Inpatient Glycemic Control (2015)  Target Ranges:  Prepandial:   less than 140 mg/dL      Peak postprandial:   less than 180 mg/dL (1-2 hours)      Critically ill patients:  140 - 180 mg/dL   Lab Results  Component Value Date   GLUCAP 197 (H) 02/26/2024   HGBA1C 11.4 (H) 09/06/2021    Review of Glycemic Control  Latest Reference Range & Units 02/26/24 03:38 02/26/24 04:57 02/26/24 06:44  Glucose-Capillary 70 - 99 mg/dL 599 (H) 771 (H) 802 (H)   Diabetes history: DM 2 Outpatient Diabetes medications:  Novolog  70/30 - 80 units bid Current orders for Inpatient glycemic control:  IV insulin - (HHS)  Inpatient Diabetes Program Recommendations:    Patient admitted with foot infection and glucose>700 mg/dL.  Last documented A1C was >11%.  Will speak to patient when appropriate to make sure she is taking insulin  properly when appropriate.  -For transition (when appropriate) off insulin  drip, consider Semglee  30 units bid, Novolog  10 units tid with meals, and Novolog  resistant correction tid with meals and HS.    Thanks,  Randall Bullocks, RN, BC-ADM Inpatient Diabetes Coordinator Pager [phone]  (8a-5p)

## 2024-02-26 NOTE — Consult Note (Addendum)
 WOC Nurse Consult Note: patient with history of charcot foot deformity and osteomyelitis of R foot 3rd digit with bacteremia; underwent amputation of 3rd digit 02/05/2024 at [facility], was discharged to rehab but left AMA; per review of chart does not appear to have followed with surgeon since surgery  Reason for Consult: r foot surgical wound  Wound type: full thickness r/t amputation 3rd digit R foot as above  Pressure Injury POA: NA not pressure  Measurement: see nursing flowsheet  Wound bed:  surgical incision with staples and sutures in place  Drainage (amount, consistency, odor) see nursing flowsheet; no mention of purulent or foul smelling drainage in H&P note  Periwound: edema  Dressing procedure/placement/frequency:  [facility] discharge orders were to place Betadine  soaked gauze on incision line and cover with dry gauze.  Will continue the same.   Paint R foot surgical incision with Betadine  then apply Betadine  soaked gauze to incision line daily, cover with dry gauze and secure with Kerlix roll gauze.  May apply Ace bandage wrapped in same fashion as Kerlix to help secure dressing.    POC discussed with bedside nurse. WOC team will not follow. Patient should follow with her surgeon for ongoing care of this surgical wound.   Thank you,    Powell Bar MSN, RN-BC, [employer]

## 2024-02-26 NOTE — Progress Notes (Signed)
 PROGRESS NOTE    Laura Escobar  FMW:[id number] DOB: 10-03-1980 DOA: 02/25/2024 PCP: Steva Clotilda DEL, NP  Chief Complaint  Patient presents with   Post-op Problem    Hospital Course:  Laura Escobar is a 43 year old female with poorly controlled diabetes, chronic foot osteomyelitis, hypertension, hyperlipidemia, diastolic CHF, depression, bipolar, CKD stage III, Addison's disease, gastroparesis, morbid obesity with BMI 49, who presents with right foot pain and swelling.  Patient was recently hospitalized at [facility] due to toe osteomyelitis and bacteremia on 6/15.  She is status post right third toe amputation on 6/1.  OR cultures at that time grew staph dysgalactiae and MSSA.  She was admitted to complete a course of cefazolin  through 7/1 and switched to linezolid at discharge.  Patient was admitted to rehab but signed out AMA prior to completion of antibiotics.  She did not continue taking antibiotics at home.  She reports that since that time she has had worsening right foot pain and swelling.  Patient had follow-up appointment with her primary care physician on 7/8 and was referred to the ED for further evaluation.  On arrival to the ED she was found to have a lactic acid of 2.1, WBC 12.3, blood sugar 743 with an anion gap of 13 and pseudohyponatremia.  She was admitted for management of HHS and podiatry was consulted for osteomyelitis.  Subjective: Anion gap is closed this morning.  Patient has began transition to subcu insulin .  She reports that she is feeling tired and has right-sided foot pain.  No nausea or vomiting.   Objective: Vitals:   02/26/24 1100 02/26/24 1155 02/26/24 1341 02/26/24 1557  BP: (!) 130/95  136/69 (!) 146/78  Pulse: 97  (!) 106 95  Resp: 18  17 17   Temp:  98.5 F (36.9 C) 98 F (36.7 C) 97.9 F (36.6 C)  TempSrc:  Oral    SpO2: 100%  100% 100%  Weight:      Height:        Intake/Output Summary (Last 24 hours) at 02/26/2024 1610 Last data filed at 02/26/2024  0249 Gross per 24 hour  Intake 1600 ml  Output --  Net 1600 ml   Filed Weights   02/25/24 1817  Weight: (!) 145.2 kg    Examination: General exam: Appears calm and comfortable, NAD  Respiratory system: No work of breathing, symmetric chest wall expansion Cardiovascular system: S1 & S2 heard, RRR.  Gastrointestinal system: Abdomen is nondistended, soft and nontender.  Neuro: Alert and oriented. No focal neurological deficits. Extremities: Right leg with diffuse swelling, chronic skin changes, significant skin flaking.  Missing toes, some purulence appreciated, staples over prior amputation site.  See photos below Psychiatry: Demonstrates appropriate judgement and insight. Mood & affect appropriate for situation.      Assessment & Plan:  Principal Problem:   Hyperosmolar hyperglycemic state (HHS) (HCC) Active Problems:   Type II diabetes mellitus with renal manifestations (HCC)   Foot osteomyelitis, right (HCC)   Essential hypertension   Hyperlipidemia   Chronic diastolic CHF (congestive heart failure) (HCC)   Acute renal failure superimposed on stage 3a chronic kidney disease (HCC)   Pain and swelling of lower leg, right   Addison's disease (HCC)   Obesity, Class III, BMI 40-49.9 (morbid obesity)    Right foot osteomyelitis Severe peripheral vascular disease Status post right third toe amputation recently Diabetic neuropathy Charcot neuroarthropathy, status post prior reconstruction - Podiatry consulted.  Does not believe there is acute erosive changes at  this time - Podiatry has recommended weightbearing to heel and minimal ambulation, should use walker. - No plans for surgical debridement at this time.  Small ulcer at tip of great toe to monitor closely. - Patient has been afebrile.  For now we will discontinue all antibiotics given no acute concern of infection per podiatry. - Follow CBC and fever curve - Pain management  HHS - Was initially on insulin  drip and  plan for ICU admission - Anion gap closed this morning, have again transition to basal/bolus insulin . - Discontinue IV fluids now - Follow CBGs closely, titrate as needed - Diabetes coordinator consult  Essential hypertension - Gradually resuming home antihypertensives.  Titrate closely  Hyperlipidemia - Continue statin  Chronic diastolic heart failure - Most recent echocardiogram June 2025: EF of 55% - Clinically euvolemic at this time.  Has unilateral right leg swelling which she reports is chronic - Resume GDMT as BP tolerates - Has received IV fluids this admission, discontinued now.  Monitor volume status closely  CKD stage IIIa - Patient had recent AKI while admitted 2 weeks ago.  Creatinine now is improved from that baseline - Continue to follow BMP - Renally dosed the creatinine clearance 50 when needed  Pain and swelling of right lower leg - Likely secondary to chronic lymphedema and recurrent operations - Doppler: neg for DVT  History of Addison's disease - Has received 100 mg Solu-Cortef  stress dose on arrival - Is not on home steroid therapy - Monitor CBGs closely  Obesity, class III BMI 49 -- Outpatient follow up for lifestyle modification and risk factor management  Pseudohyponatremia - Expect resolution as glucose resolves  History of bacteremia - blood cultures taken on arrival - Will follow blood cultures closely.  No antibiotics at this time.  Afebrile.  DVT prophylaxis: Heparin    Code Status: Full Code Disposition:  Inpatient pending clinical resolution   Consultants:  Treatment Team:  Consulting Physician: Ashley Soulier, DPM  Procedures:    Antimicrobials:  Anti-infectives (From admission, onward)    Start     Dose/Rate Route Frequency Ordered Stop   02/26/24 2200  vancomycin  (VANCOCIN ) IVPB 1000 mg/200 mL premix  Status:  Discontinued        1,000 mg 200 mL/hr over 60 Minutes Intravenous Every 24 hours 02/25/24 2039 02/26/24 1535    02/26/24 2000  vancomycin  (VANCOREADY) IVPB 1250 mg/250 mL        1,250 mg 166.7 mL/hr over 90 Minutes Intravenous Every 24 hours 02/26/24 1535     02/25/24 2200  metroNIDAZOLE  (FLAGYL ) IVPB 500 mg        500 mg 100 mL/hr over 60 Minutes Intravenous Every 12 hours 02/25/24 2024     02/25/24 2200  vancomycin  (VANCOREADY) IVPB 1500 mg/300 mL        1,500 mg 150 mL/hr over 120 Minutes Intravenous  Once 02/25/24 2039 02/26/24 0608   02/25/24 2100  cefTRIAXone  (ROCEPHIN ) 2 g in sodium chloride  0.9 % 100 mL IVPB        2 g 200 mL/hr over 30 Minutes Intravenous Every 24 hours 02/25/24 2023     02/25/24 2000  vancomycin  (VANCOCIN ) IVPB 1000 mg/200 mL premix        1,000 mg 200 mL/hr over 60 Minutes Intravenous  Once 02/25/24 1958 02/25/24 2237   02/25/24 2000  piperacillin -tazobactam (ZOSYN ) IVPB 3.375 g  Status:  Discontinued        3.375 g 12.5 mL/hr over 240 Minutes Intravenous  Once 02/25/24 1958  02/25/24 2023       Data Reviewed: I have personally reviewed following labs and imaging studies CBC: Recent Labs  Lab 02/25/24 1818 02/26/24 0509  WBC 12.3* 12.6*  NEUTROABS 8.5*  --   HGB 8.5* 8.6*  HCT 26.7* 26.6*  MCV 94.7 93.3  PLT 472* 385   Basic Metabolic Panel: Recent Labs  Lab 02/25/24 1818 02/25/24 2322 02/26/24 0509 02/26/24 0908  NA 122* 123* 130* 129*  K 4.3 3.9 3.6 4.2  CL 87* 85* 92* 93*  CO2 22 23 26 25   GLUCOSE 743* 689* 231* 213*  BUN 25* 25* 22* 23*  CREATININE 2.28* 2.14* 2.01* 1.85*  CALCIUM  7.9* 7.7* 8.0* 8.1*   GFR: Estimated Creatinine Clearance: 58.7 mL/min (A) (by C-G formula based on SCr of 1.85 mg/dL (H)). Liver Function Tests: Recent Labs  Lab 02/25/24 1818  AST 15  ALT 10  ALKPHOS 129*  BILITOT 0.5  PROT 8.2*  ALBUMIN 2.5*   CBG: Recent Labs  Lab 02/26/24 0338 02/26/24 0457 02/26/24 0644 02/26/24 0824 02/26/24 1136  GLUCAP 400* 228* 197* 195* 283*    Recent Results (from the past 240 hours)  Culture, blood (routine x 2)      Status: None (Preliminary result)   Collection Time: 02/25/24  8:33 PM   Specimen: BLOOD  Result Value Ref Range Status   Specimen Description BLOOD BLOOD LEFT ARM  Final   Special Requests   Final    BOTTLES DRAWN AEROBIC AND ANAEROBIC Blood Culture adequate volume   Culture   Final    NO GROWTH < 12 HOURS Performed at [facility], [address] [place], [place] [address]    Report Status PENDING  Incomplete  Culture, blood (routine x 2)     Status: None (Preliminary result)   Collection Time: 02/25/24  8:33 PM   Specimen: BLOOD  Result Value Ref Range Status   Specimen Description BLOOD RIGHT HAND  Final   Special Requests   Final    BOTTLES DRAWN AEROBIC AND ANAEROBIC Blood Culture results may not be optimal due to an inadequate volume of blood received in culture bottles   Culture   Final    NO GROWTH < 12 HOURS Performed at [facility], [address] [place], [place] [address]    Report Status PENDING  Incomplete     Radiology Studies: US  Venous Img Lower Unilateral Right (DVT) Result Date: 02/26/2024 CLINICAL DATA:  Right leg swelling EXAM: RIGHT LOWER EXTREMITY VENOUS DOPPLER ULTRASOUND TECHNIQUE: Gray-scale sonography with compression, as well as color and duplex ultrasound, were performed to evaluate the deep venous system(s) from the level of the common femoral vein through the popliteal and proximal calf veins. COMPARISON:  None Available. FINDINGS: VENOUS Normal compressibility of the common femoral, superficial femoral, and popliteal veins, as well as the visualized calf veins. Visualized portions of profunda femoral vein and great saphenous vein unremarkable. No filling defects to suggest DVT on grayscale or color Doppler imaging. Doppler waveforms show normal direction of venous flow, normal respiratory plasticity and response to augmentation. Limited views of the contralateral common femoral vein are unremarkable. OTHER Right popliteal fossa cyst  measures 3 x 2.4 x 1.2 cm. Limitations: Limited visualization of the calf veins. IMPRESSION: No evidence of right lower extremity DVT. Right Baker's cyst. Electronically Signed   By: Franky Crease M.D.   On: 02/26/2024 02:50   DG Foot Complete Right Result Date: 02/25/2024 CLINICAL DATA:  Postsurgical changes of right  toe amputation with persistent right foot swelling EXAM: RIGHT FOOT COMPLETE - 3 VIEW COMPARISON:  Right foot radiograph dated 09/05/2021 FINDINGS: Postsurgical changes of first, second, third, and fifth toe amputations. Intramedullary rod placement is also seen across the first and fourth tarsometatarsal joints. Hardware appear intact. Similar uniform lucency surrounding the distal threaded portions of the rods. Diffuse soft tissue swelling of the foot. Subtle lucency of the third metatarsal head. Degenerative changes of the hindfoot with similar appearance of Charcot joint. IMPRESSION: 1. Subtle lucency of the third metatarsal head, which may represent osteomyelitis. 2. Postsurgical changes of first, second, third, and fifth toe amputations. 3. Similar uniform lucency surrounding the distal threaded portions of the first and fourth metatarsal rods, which may be seen in the setting of loosening. Electronically Signed   By: Limin  Xu M.D.   On: 02/25/2024 19:04    Scheduled Meds:  atorvastatin   20 mg Oral Daily   heparin   5,000 Units Subcutaneous Q8H   insulin  aspart  0-20 Units Subcutaneous TID WC   insulin  aspart  0-5 Units Subcutaneous QHS   insulin  aspart  6 Units Subcutaneous TID WC   insulin  glargine-yfgn  20 Units Subcutaneous Daily   pantoprazole   40 mg Oral QAC breakfast   Continuous Infusions:  cefTRIAXone  (ROCEPHIN )  IV Stopped (02/25/24 2117)   dextrose  5% lactated ringers  Stopped (02/26/24 0911)   lactated ringers  125 mL/hr at 02/26/24 1452   metronidazole  Stopped (02/26/24 1111)   vancomycin        LOS: 1 day  MDM: Patient is high risk for one or more organ failure.   They necessitate ongoing hospitalization for continued IV therapies and subsequent lab monitoring. Total time spent interpreting labs and vitals, reviewing the medical record, coordinating care amongst consultants and care team members, directly assessing and discussing care with the patient and/or family: 55 min Aycen Porreca, DO [facility]  To contact the attending physician between 7A-7P please use Epic Chat. To contact the covering physician during after hours 7P-7A, please review Amion.  02/26/2024, 4:10 PM   *This document has been created with the assistance of dictation software. Please excuse typographical errors. *

## 2024-02-26 NOTE — Consult Note (Signed)
 ORTHOPAEDIC CONSULTATION  REQUESTING PHYSICIAN: Dezii, Alexandra, DO  Chief Complaint: Possible right foot infection  HPI: Laura Escobar is a 43 y.o. female who complains of pain and swelling to her right foot.  Patient underwent amputation at [facility] about 3 weeks ago for infection of the right third toe.  Per patient she was told she had a bloodstream infection as well.  Discharge that time sent to home care facility and left AMA.  Was seen in the outpatient clinic by her primary care provider and she noted some drainage from the previous second toe amputation site and was concerned about residual infection and brought to the hospital for reevaluation.  She states she has been able to perform some ambulation to her right foot without pain.  Past Medical History:  Diagnosis Date   Addison disease (HCC) 2011   Bipolar 1 disorder (HCC) 2011   Charcot foot due to diabetes mellitus (HCC)    Diabetes mellitus without complication (HCC) 1998   Gastroparesis    Hyperlipidemia    Hypertension    Ketoacidosis    Marijuana abuse    PONV (postoperative nausea and vomiting)    Past Surgical History:  Procedure Laterality Date   ABCESS DRAINAGE N/A    Buttocks 2011, right groin 2013   ABDOMINAL HYSTERECTOMY     AMPUTATION TOE Right 06/24/2015   Procedure: AMPUTATION TOE;  Surgeon: Eva Gay, DPM;  Location: [facility] ORS;  Service: Podiatry;  Laterality: Right;   AMPUTATION TOE Right 09/06/2021   Procedure: AMPUTATION 5th TOE;  Surgeon: Gay Eva, DPM;  Location: [facility] ORS;  Service: Podiatry;  Laterality: Right;   AMPUTATION TOE Right 11/15/2023   Procedure: AMPUTATION, TOE;  Surgeon: Gay Eva, DPM;  Location: [facility] ORS;  Service: Orthopedics/Podiatry;  Laterality: Right;   Social History   Socioeconomic History   Marital status: Single    Spouse name: Not on file   Number of children: Not on file   Years of education: Not on file   Highest education level: Not on file   Occupational History   Not on file  Tobacco Use   Smoking status: Former    Current packs/day: 0.25    Types: Cigarettes   Smokeless tobacco: Never  Vaping Use   Vaping status: Some Days  Substance and Sexual Activity   Alcohol use: No    Alcohol/week: 0.0 standard drinks of alcohol    Comment: 1 glasas of wine twice a year   Drug use: Yes    Types: Marijuana    Comment: 3-4 joints 4-5 days a week   Sexual activity: Yes    Comment: NONE  Other Topics Concern   Not on file  Social History Narrative   Not on file   Social Drivers of Health   Financial Resource Strain: Low Risk  (02/04/2024)   Received from [facility]   Overall Financial Resource Strain (CARDIA)    How hard is it for you to pay for the very basics like food, housing, medical care, and heating?: Not very hard  Food Insecurity: No Food Insecurity (02/04/2024)   Received from [facility]   Hunger Vital Sign    Within the past 12 months, you worried that your food would run out before you got the money to buy more.: Never true    Within the past 12 months, the food you bought just didn't last and you didn't have money to get more.: Never true  Transportation Needs: No Transportation Needs (  02/04/2024)   Received from [facility]   PRAPARE - Transportation    Lack of Transportation (Medical): No    Lack of Transportation (Non-Medical): No  Physical Activity: Insufficiently Active (03/08/2023)   Received from [facility]   Exercise Vital Sign    On average, how many days per week do you engage in moderate to strenuous exercise (like a brisk walk)?: 3 days    On average, how many minutes do you engage in exercise at this level?: 20 min  Stress: Stress Concern Present (03/08/2023)   Received from [facility] - Occupational Stress Questionnaire    Feeling of Stress : Rather much  Social Connections: Socially Isolated (03/08/2023)   Received from [facility]   Social Connection and Isolation Panel    In a typical week, how many times do you talk on the phone with family, friends, or neighbors?: More than three times a week    How often do you get together with friends or relatives?: More than three times a week    How often do you attend church or religious services?: Never    Do you belong to any clubs or organizations such as church groups, unions, fraternal or athletic groups, or school groups?: No    How often do you attend meetings of the clubs or organizations you belong to?: Never    Are you married, widowed, divorced, separated, never married, or living with a partner?: Never married   Family History  Problem Relation Age of Onset   Diabetes Mother    Stroke Mother    Healthy Father    Allergies  Allergen Reactions   Amlodipine  Anaphylaxis and Swelling   Metformin And Related    Olanzapine Anxiety    Pulled hair out   Prior to Admission medications   Medication Sig Start Date End Date Taking? Authorizing Provider  Aspirin-Acetaminophen -Caffeine (GOODY HEADACHE PO) Take 1 packet by mouth as needed.   Yes [provider]  atorvastatin  (LIPITOR) 20 MG tablet Take 20 mg by mouth daily.   Yes [provider]  diclofenac  Sodium (VOLTAREN ) 1 % GEL Apply 2 g topically 4 (four) times daily as needed. 02/14/24 02/13/25 Yes [provider]  dimenhyDRINATE (DRAMAMINE) 50 MG tablet Take 50 mg by mouth daily as needed for nausea.   Yes [provider]  doxepin  (SINEQUAN ) 10 MG capsule Take 10 mg by mouth at bedtime. 12/13/23 03/12/24 Yes [provider]  FLUoxetine  (PROZAC ) 20 MG capsule Take 20 mg by mouth daily.   Yes [provider]  ibuprofen  (ADVIL ) 200 MG tablet Take 600 mg by mouth every 4 (four) hours as needed for moderate pain (pain score 4-6) or mild pain (pain score 1-3).   Yes [provider]  insulin  aspart protamine - aspart (NOVOLOG  MIX 70/30 FLEXPEN) (70-30)  100 UNIT/ML FlexPen Inject 75 Units into the skin 2 (two) times daily with a meal. Patient taking differently: Inject 90 Units into the skin 2 (two) times daily with a meal. 09/07/21 02/26/24 Yes Awanda [place], MD  insulin  lispro (HUMALOG) 100 UNIT/ML KwikPen Take 12 units for each meal, if does not eat meal, use only correctional scale, if eats 50% of meal, give 50% dose and for blood glucose results: 51-70: Consume juice or crackers. 71-150: Give 0 units. 151-200: Give 1 unit. 201-250: Give 2 units. 251-300: Give 3 units. 301-350: Give 4 units. 351-400: Give 5 units. 02/14/24  Yes [provider]  insulin  NPH Human (NOVOLIN N) 100 UNIT/ML injection Inject 40 Units into the skin. Inject 40 Units subcutaneously 02/14/24 05/14/24 Yes [provider]  lidocaine  4 % Place 1 patch onto the skin.  Place 1 patch onto the skin once daily 02/14/24  Yes [provider]  LORazepam  (ATIVAN ) 1 MG tablet Take 1 mg by mouth every 8 (eight) hours as needed for anxiety. 01/29/24  Yes [provider]  losartan  (COZAAR ) 100 MG tablet Take 50 mg by mouth daily.   Yes [provider]  metolazone  (ZAROXOLYN ) 2.5 MG tablet Take 2.5 mg by mouth every other day.   Yes [provider]  nicotine  (NICODERM CQ  - DOSED IN MG/24 HOURS) 14 mg/24hr patch Place 1 patch onto the skin.  Place 1 patch onto the skin once daily 02/14/24 03/15/24 Yes [provider]  nicotine  polacrilex (COMMIT) 2 MG lozenge Place 2 mg inside cheek.  Place 2 mg inside cheek 02/14/24 03/15/24 Yes [provider]  pantoprazole  (PROTONIX ) 40 MG tablet Take 40 mg by mouth daily before breakfast. 02/14/24 05/14/24 Yes [provider]  polyethylene glycol (MIRALAX  / GLYCOLAX ) 17 g packet Take 17 g by mouth daily. 02/14/24 05/14/24 Yes [provider]  senna (SENOKOT) 8.6 MG tablet Take 2 tablets by mouth at bedtime as needed for constipation. 02/14/24 05/14/24 Yes [provider]   torsemide  (DEMADEX ) 20 MG tablet Take 20 mg by mouth daily. 02/14/24 05/14/24 Yes [provider]  triamterene-hydrochlorothiazide (MAXZIDE-25) 37.5-25 MG tablet Take 1 tablet by mouth daily.   Yes [provider]  Cyanocobalamin 1000 MCG TBCR Take 1 tablet by mouth daily. Patient not taking: Reported on 02/26/2024 07/21/19   [provider]  oxyCODONE  (OXY IR/ROXICODONE ) 5 MG immediate release tablet  05/17/23   [provider]   US  Venous Img Lower Unilateral Right (DVT) Result Date: 02/26/2024 CLINICAL DATA:  Right leg swelling EXAM: RIGHT LOWER EXTREMITY VENOUS DOPPLER ULTRASOUND TECHNIQUE: Gray-scale sonography with compression, as well as color and duplex ultrasound, were performed to evaluate the deep venous system(s) from the level of the common femoral vein through the popliteal and proximal calf veins. COMPARISON:  None Available. FINDINGS: VENOUS Normal compressibility of the common femoral, superficial femoral, and popliteal veins, as well as the visualized calf veins. Visualized portions of profunda femoral vein and great saphenous vein unremarkable. No filling defects to suggest DVT on grayscale or color Doppler imaging. Doppler waveforms show normal direction of venous flow, normal respiratory plasticity and response to augmentation. Limited views of the contralateral common femoral vein are unremarkable. OTHER Right popliteal fossa cyst measures 3 x 2.4 x 1.2 cm. Limitations: Limited visualization of the calf veins. IMPRESSION: No evidence of right lower extremity DVT. Right Baker's cyst. Electronically Signed   By: Franky Crease M.D.   On: 02/26/2024 02:50   DG Foot Complete Right Result Date: 02/25/2024 CLINICAL DATA:  Postsurgical changes of right toe amputation with persistent right foot swelling EXAM: RIGHT FOOT COMPLETE - 3 VIEW COMPARISON:  Right foot radiograph dated 09/05/2021 FINDINGS: Postsurgical changes of first, second, third, and fifth toe  amputations. Intramedullary rod placement is also seen across the first and fourth tarsometatarsal joints. Hardware appear intact. Similar uniform lucency surrounding the distal threaded portions of the rods. Diffuse soft tissue swelling of the foot. Subtle lucency of the third metatarsal head. Degenerative changes of the hindfoot with similar appearance of Charcot joint. IMPRESSION: 1. Subtle lucency of the third metatarsal head,  which may represent osteomyelitis. 2. Postsurgical changes of first, second, third, and fifth toe amputations. 3. Similar uniform lucency surrounding the distal threaded portions of the first and fourth metatarsal rods, which may be seen in the setting of loosening. Electronically Signed   By: Limin  Xu M.D.   On: 02/25/2024 19:04    Positive ROS: All other systems have been reviewed and were otherwise negative with the exception of those mentioned in the HPI and as above.  12 point ROS was performed.  Physical Exam: General: Alert and oriented.  No apparent distress.  Vascular:  Left foot:Nonpalpable pulses secondary to edema  Right foot: Nonpalpable pulses secondary to edema  Neuro: She is completely neuropathic to bilateral extremities  Derm: She has a well-healed scar at the third toe amputation site on the right foot.  I was able to remove the skin staples and no dehiscence was noted.  No active drainage from the site at all.  Ortho/MS: She is status post partial amputation of the right great toe and second toe with complete amputation of the right 3rd and 5th toes.  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  She does have marked edema to the foot and entire right leg.  There is no crepitus or instability with attempted range of motion of the midtarsal region.  I personally reviewed her x-rays of her right foot.  It shows the amputations as described above.  Skin staples noted along the third toe amputation site.  There was an area of concern of possible  osteomyelitis of the distal medial aspect of the third metatarsal head.  This was not seen on all films at this time.  Does not appear to be acute at this time.  There is no periosteal bone reaction or fracturing at this time.  Retained hardware into the midtarsal region from her Charcot reconstruction.  Assessment: Severe peripheral vascular disease status post right third toe amputation recently Recent sepsis Diabetes with neuropathy  Plan: I would like to remove the superficial skin staples.  No dehiscence was noted.  No active purulent drainage.  No lymphangitic streaking noted at this time.  I did evaluate the x-ray and at the site of concern this does not appear to be an acute erosive change.  We can continue to monitor for now.  She can weight-bear on her heel.  I would recommend minimal ambulation less than 10 feet of ambulation for short distances.  She should likely use a walker.  We will continue to monitor while in house.  Will hold on surgical debridement or workup.  She does have a small ulcer on the tip of her great toe and second toe amputation sites that we will have to monitor but on x-ray these do not show any erosive changes or gas.    Ashley Eva LABOR, DPM Cell [phone]   02/26/2024 2:10 PM

## 2024-02-26 NOTE — Progress Notes (Signed)
 Messaged MD re: IVF, order for LR and D5LR in [facility]. BG [phone] at last check, MD stated to run LR.

## 2024-02-27 ENCOUNTER — Encounter: Payer: Self-pay | Admitting: Internal Medicine

## 2024-02-27 ENCOUNTER — Other Ambulatory Visit: Payer: Self-pay

## 2024-02-27 DIAGNOSIS — E782 Mixed hyperlipidemia: Secondary | ICD-10-CM | POA: Diagnosis not present

## 2024-02-27 DIAGNOSIS — E1100 Type 2 diabetes mellitus with hyperosmolarity without nonketotic hyperglycemic-hyperosmolar coma (NKHHC): Secondary | ICD-10-CM | POA: Diagnosis not present

## 2024-02-27 DIAGNOSIS — M869 Osteomyelitis, unspecified: Secondary | ICD-10-CM | POA: Diagnosis not present

## 2024-02-27 DIAGNOSIS — E1122 Type 2 diabetes mellitus with diabetic chronic kidney disease: Secondary | ICD-10-CM | POA: Diagnosis not present

## 2024-02-27 LAB — CBC WITH DIFFERENTIAL/PLATELET
Abs Immature Granulocytes: 0.12 K/uL — ABNORMAL HIGH (ref 0.00–0.07)
Basophils Absolute: 0 K/uL (ref 0.0–0.1)
Basophils Relative: 0 %
Eosinophils Absolute: 0.1 K/uL (ref 0.0–0.5)
Eosinophils Relative: 1 %
HCT: 24.3 % — ABNORMAL LOW (ref 36.0–46.0)
Hemoglobin: 7.7 g/dL — ABNORMAL LOW (ref 12.0–15.0)
Immature Granulocytes: 1 %
Lymphocytes Relative: 26 %
Lymphs Abs: 3.1 K/uL (ref 0.7–4.0)
MCH: 29.3 pg (ref 26.0–34.0)
MCHC: 31.7 g/dL (ref 30.0–36.0)
MCV: 92.4 fL (ref 80.0–100.0)
Monocytes Absolute: 1 K/uL (ref 0.1–1.0)
Monocytes Relative: 9 %
Neutro Abs: 7.8 K/uL — ABNORMAL HIGH (ref 1.7–7.7)
Neutrophils Relative %: 63 %
Platelets: 388 K/uL (ref 150–400)
RBC: 2.63 MIL/uL — ABNORMAL LOW (ref 3.87–5.11)
RDW: 13 % (ref 11.5–15.5)
WBC: 12.2 K/uL — ABNORMAL HIGH (ref 4.0–10.5)
nRBC: 0 % (ref 0.0–0.2)

## 2024-02-27 LAB — FERRITIN: Ferritin: 168 ng/mL (ref 11–307)

## 2024-02-27 LAB — COMPREHENSIVE METABOLIC PANEL WITH GFR
ALT: 7 U/L (ref 0–44)
AST: 14 U/L — ABNORMAL LOW (ref 15–41)
Albumin: 2.1 g/dL — ABNORMAL LOW (ref 3.5–5.0)
Alkaline Phosphatase: 96 U/L (ref 38–126)
Anion gap: 11 (ref 5–15)
BUN: 27 mg/dL — ABNORMAL HIGH (ref 6–20)
CO2: 25 mmol/L (ref 22–32)
Calcium: 7.5 mg/dL — ABNORMAL LOW (ref 8.9–10.3)
Chloride: 94 mmol/L — ABNORMAL LOW (ref 98–111)
Creatinine, Ser: 2.34 mg/dL — ABNORMAL HIGH (ref 0.44–1.00)
GFR, Estimated: 26 mL/min — ABNORMAL LOW (ref >60)
Glucose, Bld: 296 mg/dL — ABNORMAL HIGH (ref 70–99)
Potassium: 3.3 mmol/L — ABNORMAL LOW (ref 3.5–5.1)
Sodium: 130 mmol/L — ABNORMAL LOW (ref 135–145)
Total Bilirubin: 0.4 mg/dL (ref 0.0–1.2)
Total Protein: 7.1 g/dL (ref 6.5–8.1)

## 2024-02-27 LAB — MAGNESIUM: Magnesium: 1 mg/dL — ABNORMAL LOW (ref 1.7–2.4)

## 2024-02-27 LAB — IRON AND TIBC
Iron: 35 ug/dL (ref 28–170)
Saturation Ratios: 24 % (ref 10.4–31.8)
TIBC: 146 ug/dL — ABNORMAL LOW (ref 250–450)
UIBC: 111 ug/dL

## 2024-02-27 LAB — GLUCOSE, CAPILLARY
Glucose-Capillary: 356 mg/dL — ABNORMAL HIGH (ref 70–99)
Glucose-Capillary: 267 mg/dL — ABNORMAL HIGH (ref 70–99)
Glucose-Capillary: 121 mg/dL — ABNORMAL HIGH (ref 70–99)
Glucose-Capillary: 224 mg/dL — ABNORMAL HIGH (ref 70–99)

## 2024-02-27 LAB — RETICULOCYTES
Immature Retic Fract: 31.9 % — ABNORMAL HIGH (ref 2.3–15.9)
RBC.: 2.55 MIL/uL — ABNORMAL LOW (ref 3.87–5.11)
Retic Count, Absolute: 90.5 K/uL (ref 19.0–186.0)
Retic Ct Pct: 3.6 % — ABNORMAL HIGH (ref 0.4–3.1)

## 2024-02-27 LAB — PHOSPHORUS: Phosphorus: 3.3 mg/dL (ref 2.5–4.6)

## 2024-02-27 LAB — VITAMIN B12: Vitamin B-12: 561 pg/mL (ref 180–914)

## 2024-02-27 LAB — FOLATE: Folate: 7.1 ng/mL (ref >5.9)

## 2024-02-27 MED ORDER — POTASSIUM CHLORIDE CRYS ER 20 MEQ PO TBCR
40.0000 meq | EXTENDED_RELEASE_TABLET | Freq: Once | ORAL | Status: DC
  Filled 2024-02-27: qty 2

## 2024-02-27 MED ORDER — POTASSIUM CHLORIDE 10 MEQ/100ML IV SOLN
10.0000 meq | INTRAVENOUS | Status: AC
  Administered 2024-02-27 (×4): 10 meq via INTRAVENOUS
  Filled 2024-02-27 (×3): qty 100

## 2024-02-27 MED ORDER — LABETALOL HCL 5 MG/ML IV SOLN: 10.0000 mg | INTRAVENOUS | Status: DC | PRN

## 2024-02-27 MED ORDER — INSULIN GLARGINE-YFGN 100 UNIT/ML ~~LOC~~ SOLN
35.0000 [IU] | Freq: Every day | SUBCUTANEOUS | Status: DC
  Administered 2024-02-27: 35 [IU] via SUBCUTANEOUS
  Filled 2024-02-27 (×2): qty 0.35

## 2024-02-27 MED ORDER — MAGNESIUM SULFATE 2 GM/50ML IV SOLN
2.0000 g | Freq: Once | INTRAVENOUS | Status: AC
  Administered 2024-02-27: 2 g via INTRAVENOUS
  Filled 2024-02-27: qty 50

## 2024-02-27 MED ORDER — METOCLOPRAMIDE HCL 5 MG/ML IJ SOLN
10.0000 mg | Freq: Three times a day (TID) | INTRAMUSCULAR | Status: DC | PRN
  Administered 2024-02-27: 10 mg via INTRAVENOUS
  Filled 2024-02-27: qty 2

## 2024-02-27 MED ORDER — HYDRALAZINE HCL 20 MG/ML IJ SOLN: 10.0000 mg | Freq: Four times a day (QID) | INTRAMUSCULAR | Status: DC | PRN

## 2024-02-27 NOTE — Progress Notes (Signed)
 Daily Progress Note   Subjective  - * No surgery found *  Follow-up right foot wound.  Dressing removed today and there is no signs of active drainage.  Objective Vitals:   02/26/24 2005 02/27/24 0458 02/27/24 0821 02/27/24 1200  BP: (!) 101/55 105/60 122/68 (!) 183/99  Pulse: 96 89 (!) 102 97  Resp: 17 17 19 16   Temp: 97.9 F (36.6 C) 97.8 F (36.6 C) 98.3 F (36.8 C) 98.3 F (36.8 C)  TempSrc:  Oral Oral Oral  SpO2: 100% 100% 100% 100%  Weight:      Height:        Physical Exam: No dehiscence to the wound.  Skin staple removal site is healing nicely.  I did leave the sutures intact for removal outpatient.  Laboratory CBC    Component Value Date/Time   WBC 12.2 (H) 02/27/2024 0254   HGB 7.7 (L) 02/27/2024 0254   HCT 24.3 (L) 02/27/2024 0254   PLT 388 02/27/2024 0254    BMET    Component Value Date/Time   NA 130 (L) 02/27/2024 0254   K 3.3 (L) 02/27/2024 0254   CL 94 (L) 02/27/2024 0254   CO2 25 02/27/2024 0254   GLUCOSE 296 (H) 02/27/2024 0254   BUN 27 (H) 02/27/2024 0254   CREATININE 2.34 (H) 02/27/2024 0254   CALCIUM  7.5 (L) 02/27/2024 0254   GFRNONAA 26 (L) 02/27/2024 0254   GFRAA >60 08/09/2016 1315    Assessment/Planning: History of toe amputation. Diabetes with neuropathy uncontrolled.  The wound looks to be stable at this time.  She can continue to keep a padded bandage on the area.  She does have severe diffuse edema to the right foot and compression may be a benefit.  Weight-bear on the heel in her postoperative shoe.  For now podiatry will sign off.  She can follow-up outpatient 1 to 2 weeks.   Ashley Soulier A  02/27/2024, 3:43 PM

## 2024-02-27 NOTE — Progress Notes (Signed)
 PROGRESS NOTE    Laura Escobar  FMW:[id number] DOB: 05/20/1981 DOA: 02/25/2024 PCP: Steva Clotilda DEL, NP  Chief Complaint  Patient presents with   Post-op Problem    Hospital Course:  Laura Escobar is a 43 year old female with poorly controlled diabetes, chronic foot osteomyelitis, hypertension, hyperlipidemia, diastolic CHF, depression, bipolar, CKD stage III, Addison's disease, gastroparesis, morbid obesity with BMI 49, who presents with right foot pain and swelling.  Patient was recently hospitalized at [facility] due to toe osteomyelitis and bacteremia on 6/15.  She is status post right third toe amputation on 6/1.  OR cultures at that time grew staph dysgalactiae and MSSA.  She was admitted to complete a course of cefazolin  through 7/1 and switched to linezolid at discharge.  Patient was admitted to rehab but signed out AMA prior to completion of antibiotics.  She did not continue taking antibiotics at home.  She reports that since that time she has had worsening right foot pain and swelling.  Patient had follow-up appointment with her primary care physician on 7/8 and was referred to the ED for further evaluation.  On arrival to the ED she was found to have a lactic acid of 2.1, WBC 12.3, blood sugar 743 with an anion gap of 13 and pseudohyponatremia.  She was admitted for management of HHS and podiatry was consulted for osteomyelitis.  Subjective: Patient was some nausea vomiting this morning.  She reports some abdominal pain with each nausea vomiting.  She reports this is chronic secondary to her gastroparesis.  Objective: Vitals:   02/26/24 2005 02/27/24 0458 02/27/24 0821 02/27/24 1200  BP: (!) 101/55 105/60 122/68 (!) 183/99  Pulse: 96 89 (!) 102 97  Resp: 17 17 19 16   Temp: 97.9 F (36.6 C) 97.8 F (36.6 C) 98.3 F (36.8 C) 98.3 F (36.8 C)  TempSrc:  Oral Oral Oral  SpO2: 100% 100% 100% 100%  Weight:      Height:        Intake/Output Summary (Last 24 hours) at 02/27/2024  1435 Last data filed at 02/27/2024 0600 Gross per 24 hour  Intake 360 ml  Output --  Net 360 ml   Filed Weights   02/25/24 1817  Weight: (!) 145.2 kg    Examination: General exam: Appears calm and comfortable, NAD  Respiratory system: No work of breathing, symmetric chest wall expansion Cardiovascular system: S1 & S2 heard, RRR.  Gastrointestinal system: Abdomen is nondistended, soft and nontender.  Neuro: Alert and oriented. No focal neurological deficits. Extremities: Right leg with diffuse swelling, chronic skin changes, significant skin flaking.  Right foot wound with Ace bandage, C/D/I Psychiatry: Demonstrates appropriate judgement and insight. Mood & affect appropriate for situation.   Assessment & Plan:  Principal Problem:   Hyperosmolar hyperglycemic state (HHS) (HCC) Active Problems:   Type II diabetes mellitus with renal manifestations (HCC)   Foot osteomyelitis, right (HCC)   Essential hypertension   Hyperlipidemia   Chronic diastolic CHF (congestive heart failure) (HCC)   Acute renal failure superimposed on stage 3a chronic kidney disease (HCC)   Pain and swelling of lower leg, right   Addison's disease (HCC)   Obesity, Class III, BMI 40-49.9 (morbid obesity)    Right foot osteomyelitis Severe peripheral vascular disease Status post right third toe amputation recently Diabetic neuropathy Charcot neuroarthropathy, status post prior reconstruction - Podiatry consulted.  Does not believe there is acute erosive changes at this time - Podiatry has recommended weightbearing to heel and minimal ambulation, should  use walker.  Foot wound has been redressed - No plans for surgical debridement at this time.  Small ulcer at tip of great toe to monitor closely. - Patient has been afebrile.  Antibiotics have been discontinued for now given no acute concern of infection per podiatry - Continue to follow CBC and fever curve closely - Continue with pain management.     HHS Uncontrolled type 2 diabetes - Was initially on insulin  drip and planned for ICU admission - Anion gap now closed.  Have transition to basal/bolus - Continue following CBGs closely, titrate as needed.  Increased basal dosing today. - Hemoglobin A1c 10.6% - Patient has had some nausea vomiting and thus p.o. intake is inconsistent.  Scheduled Reglan . - Diabetes coordinator consult  Gastroparesis - Have scheduled Reglan   Essential hypertension - Resuming home antihypertensives.  Follow closely and titrate as needed - As needed labetalol  and hydralazine   Hyperlipidemia - Continue statin  Chronic diastolic heart failure - Most recent echocardiogram June 2025: EF of 55% - Clinically euvolemic at this time.  Has unilateral right leg swelling which she reports is chronic - Resume home GDMT, titrate as needed - Has received IV fluids during this admission.  This has since been discontinued.  Monitor volume status very closely.  CKD stage IIIa - Patient had recent AKI while admitted 2 weeks ago.  Creatinine now is improved from that baseline but uptrending again today.  I suspect her baseline is near 2.0 - continue to follow BMP - Renally dosed with a creatinine clearance of 46 when needed  Pain and swelling of right lower leg - Likely secondary to chronic lymphedema and recurrent operations - Doppler: neg for DVT  History of Addison's disease - Has received 100 mg Solu-Cortef  stress dose on arrival - Is not on home steroid therapy - Monitor CBGs closely  Obesity, class III BMI 49 -- Outpatient follow up for lifestyle modification and risk factor management  Pseudohyponatremia - Expect resolution as glucose resolves  History of bacteremia - blood cultures taken on arrival - Will follow blood cultures closely.  No antibiotics at this time.  Afebrile.  DVT prophylaxis: Heparin    Code Status: Full Code Disposition: Currently with nausea, vomiting, and AKI.  Monitoring  off antibiotics and titrating insulin  needs.  Will discharge home possibly with home health once clinically improved  Consultants:  Treatment Team:  Consulting Physician: Ashley Soulier, DPM  Procedures:    Antimicrobials:  Anti-infectives (From admission, onward)    Start     Dose/Rate Route Frequency Ordered Stop   02/26/24 2200  vancomycin  (VANCOCIN ) IVPB 1000 mg/200 mL premix  Status:  Discontinued        1,000 mg 200 mL/hr over 60 Minutes Intravenous Every 24 hours 02/25/24 2039 02/26/24 1535   02/26/24 2000  vancomycin  (VANCOREADY) IVPB 1250 mg/250 mL  Status:  Discontinued        1,250 mg 166.7 mL/hr over 90 Minutes Intravenous Every 24 hours 02/26/24 1535 02/26/24 1619   02/25/24 2200  metroNIDAZOLE  (FLAGYL ) IVPB 500 mg  Status:  Discontinued        500 mg 100 mL/hr over 60 Minutes Intravenous Every 12 hours 02/25/24 2024 02/26/24 1619   02/25/24 2200  vancomycin  (VANCOREADY) IVPB 1500 mg/300 mL        1,500 mg 150 mL/hr over 120 Minutes Intravenous  Once 02/25/24 2039 02/26/24 0608   02/25/24 2100  cefTRIAXone  (ROCEPHIN ) 2 g in sodium chloride  0.9 % 100 mL IVPB  Status:  Discontinued        2 g 200 mL/hr over 30 Minutes Intravenous Every 24 hours 02/25/24 2023 02/26/24 1619   02/25/24 2000  vancomycin  (VANCOCIN ) IVPB 1000 mg/200 mL premix        1,000 mg 200 mL/hr over 60 Minutes Intravenous  Once 02/25/24 1958 02/25/24 2237   02/25/24 2000  piperacillin -tazobactam (ZOSYN ) IVPB 3.375 g  Status:  Discontinued        3.375 g 12.5 mL/hr over 240 Minutes Intravenous  Once 02/25/24 1958 02/25/24 2023       Data Reviewed: I have personally reviewed following labs and imaging studies CBC: Recent Labs  Lab 02/25/24 1818 02/26/24 0509 02/27/24 0254  WBC 12.3* 12.6* 12.2*  NEUTROABS 8.5*  --  7.8*  HGB 8.5* 8.6* 7.7*  HCT 26.7* 26.6* 24.3*  MCV 94.7 93.3 92.4  PLT 472* 385 388   Basic Metabolic Panel: Recent Labs  Lab 02/25/24 2322 02/26/24 0509 02/26/24 0908  02/26/24 1757 02/27/24 0254  NA 123* 130* 129* 130* 130*  K 3.9 3.6 4.2 4.0 3.3*  CL 85* 92* 93* 92* 94*  CO2 23 26 25 24 25   GLUCOSE 689* 231* 213* 283* 296*  BUN 25* 22* 23* 25* 27*  CREATININE 2.14* 2.01* 1.85* 1.81* 2.34*  CALCIUM  7.7* 8.0* 8.1* 7.9* 7.5*  MG  --   --   --   --  1.0*  PHOS  --   --   --   --  3.3   GFR: Estimated Creatinine Clearance: 46.4 mL/min (A) (by C-G formula based on SCr of 2.34 mg/dL (H)). Liver Function Tests: Recent Labs  Lab 02/25/24 1818 02/27/24 0254  AST 15 14*  ALT 10 7  ALKPHOS 129* 96  BILITOT 0.5 0.4  PROT 8.2* 7.1  ALBUMIN 2.5* 2.1*   CBG: Recent Labs  Lab 02/26/24 1136 02/26/24 1628 02/26/24 2142 02/27/24 0820 02/27/24 1201  GLUCAP 283* 293* 240* 356* 267*    Recent Results (from the past 240 hours)  Culture, blood (routine x 2)     Status: None (Preliminary result)   Collection Time: 02/25/24  8:33 PM   Specimen: BLOOD  Result Value Ref Range Status   Specimen Description BLOOD BLOOD LEFT ARM  Final   Special Requests   Final    BOTTLES DRAWN AEROBIC AND ANAEROBIC Blood Culture adequate volume   Culture   Final    NO GROWTH < 12 HOURS Performed at [facility], [address] [place], [place] [address]    Report Status PENDING  Incomplete  Culture, blood (routine x 2)     Status: None (Preliminary result)   Collection Time: 02/25/24  8:33 PM   Specimen: BLOOD  Result Value Ref Range Status   Specimen Description BLOOD RIGHT HAND  Final   Special Requests   Final    BOTTLES DRAWN AEROBIC AND ANAEROBIC Blood Culture results may not be optimal due to an inadequate volume of blood received in culture bottles   Culture   Final    NO GROWTH < 12 HOURS Performed at [facility], [address], [place], [place] [address]    Report Status PENDING  Incomplete     Radiology Studies: US  Venous Img Lower Unilateral Right (DVT) Result Date: 02/26/2024 CLINICAL DATA:  Right leg swelling EXAM: RIGHT  LOWER EXTREMITY VENOUS DOPPLER ULTRASOUND TECHNIQUE: Gray-scale sonography with compression, as well as color and duplex ultrasound, were performed to evaluate the deep venous system(s) from the  level of the common femoral vein through the popliteal and proximal calf veins. COMPARISON:  None Available. FINDINGS: VENOUS Normal compressibility of the common femoral, superficial femoral, and popliteal veins, as well as the visualized calf veins. Visualized portions of profunda femoral vein and great saphenous vein unremarkable. No filling defects to suggest DVT on grayscale or color Doppler imaging. Doppler waveforms show normal direction of venous flow, normal respiratory plasticity and response to augmentation. Limited views of the contralateral common femoral vein are unremarkable. OTHER Right popliteal fossa cyst measures 3 x 2.4 x 1.2 cm. Limitations: Limited visualization of the calf veins. IMPRESSION: No evidence of right lower extremity DVT. Right Baker's cyst. Electronically Signed   By: Franky Crease M.D.   On: 02/26/2024 02:50   DG Foot Complete Right Result Date: 02/25/2024 CLINICAL DATA:  Postsurgical changes of right toe amputation with persistent right foot swelling EXAM: RIGHT FOOT COMPLETE - 3 VIEW COMPARISON:  Right foot radiograph dated 09/05/2021 FINDINGS: Postsurgical changes of first, second, third, and fifth toe amputations. Intramedullary rod placement is also seen across the first and fourth tarsometatarsal joints. Hardware appear intact. Similar uniform lucency surrounding the distal threaded portions of the rods. Diffuse soft tissue swelling of the foot. Subtle lucency of the third metatarsal head. Degenerative changes of the hindfoot with similar appearance of Charcot joint. IMPRESSION: 1. Subtle lucency of the third metatarsal head, which may represent osteomyelitis. 2. Postsurgical changes of first, second, third, and fifth toe amputations. 3. Similar uniform lucency surrounding the  distal threaded portions of the first and fourth metatarsal rods, which may be seen in the setting of loosening. Electronically Signed   By: Limin  Xu M.D.   On: 02/25/2024 19:04    Scheduled Meds:  atorvastatin   20 mg Oral Daily   doxepin   10 mg Oral QHS   FLUoxetine   20 mg Oral Daily   heparin   5,000 Units Subcutaneous Q8H   insulin  aspart  0-20 Units Subcutaneous TID WC   insulin  aspart  0-5 Units Subcutaneous QHS   insulin  aspart  6 Units Subcutaneous TID WC   insulin  glargine-yfgn  35 Units Subcutaneous Daily   metolazone   2.5 mg Oral QODAY   nicotine   14 mg Transdermal Daily   pantoprazole   40 mg Oral QAC breakfast   polyethylene glycol  17 g Oral Daily   torsemide   20 mg Oral Daily   Continuous Infusions:     LOS: 2 days  MDM: Patient is high risk for one or more organ failure.  They necessitate ongoing hospitalization for continued IV therapies and subsequent lab monitoring. Total time spent interpreting labs and vitals, reviewing the medical record, coordinating care amongst consultants and care team members, directly assessing and discussing care with the patient and/or family: 55 min Korynne Dols, DO [facility]  To contact the attending physician between 7A-7P please use Epic Chat. To contact the covering physician during after hours 7P-7A, please review Amion.  02/27/2024, 2:35 PM   *This document has been created with the assistance of dictation software. Please excuse typographical errors. *

## 2024-02-27 NOTE — Plan of Care (Signed)

## 2024-02-27 NOTE — Inpatient Diabetes Management (Addendum)
 Inpatient Diabetes Program Recommendations  AACE/ADA: New Consensus Statement on Inpatient Glycemic Control (2015)  Target Ranges:  Prepandial:   less than 140 mg/dL      Peak postprandial:   less than 180 mg/dL (1-2 hours)      Critically ill patients:  140 - 180 mg/dL   Lab Results  Component Value Date   GLUCAP 356 (H) 02/27/2024   HGBA1C 10.6 (H) 02/26/2024    Review of Glycemic Control  Latest Reference Range & Units 02/26/24 08:24 02/26/24 11:36 02/26/24 16:28 02/26/24 21:42 02/27/24 08:20  Glucose-Capillary 70 - 99 mg/dL 804 (H) 716 (H) 706 (H) 240 (H) 356 (H)   Diabetes history: DM2 Outpatient Diabetes medications:  Novolog  70/30 - 80 units bid Current orders for Inpatient glycemic control:  Novolog  0-20 units tid with meals and HS Novolog  6 units tid with meals Semglee  35 units daily  Inpatient Diabetes Program Recommendations:    Consider increasing Semglee  to 35 units bid.   Addendum:  Spoke to patient at bedside and confirmed that she is taking Novolog  70/30 - 80 units bid at home.  I asked about CGM but she states that she prefers fingersticks.  Encouraged close follow up with provider. A1C is 10.6% but slightly better than 2023.  Reminded patient that blood sugar control is important for healing.  Patient sleepy but answered questions that I asked.   Thanks,  Randall Bullocks, RN, BC-ADM Inpatient Diabetes Coordinator Pager [phone]  (8a-5p)

## 2024-02-28 DIAGNOSIS — E1122 Type 2 diabetes mellitus with diabetic chronic kidney disease: Secondary | ICD-10-CM | POA: Diagnosis not present

## 2024-02-28 DIAGNOSIS — E1100 Type 2 diabetes mellitus with hyperosmolarity without nonketotic hyperglycemic-hyperosmolar coma (NKHHC): Secondary | ICD-10-CM | POA: Diagnosis not present

## 2024-02-28 DIAGNOSIS — M869 Osteomyelitis, unspecified: Secondary | ICD-10-CM | POA: Diagnosis not present

## 2024-02-28 DIAGNOSIS — E782 Mixed hyperlipidemia: Secondary | ICD-10-CM | POA: Diagnosis not present

## 2024-02-28 LAB — CBC WITH DIFFERENTIAL/PLATELET
Abs Immature Granulocytes: 0.09 K/uL — ABNORMAL HIGH (ref 0.00–0.07)
Basophils Absolute: 0 K/uL (ref 0.0–0.1)
Basophils Relative: 0 %
Eosinophils Absolute: 0 K/uL (ref 0.0–0.5)
Eosinophils Relative: 0 %
HCT: 24 % — ABNORMAL LOW (ref 36.0–46.0)
Hemoglobin: 7.6 g/dL — ABNORMAL LOW (ref 12.0–15.0)
Immature Granulocytes: 1 %
Lymphocytes Relative: 31 %
Lymphs Abs: 3 K/uL (ref 0.7–4.0)
MCH: 29.2 pg (ref 26.0–34.0)
MCHC: 31.7 g/dL (ref 30.0–36.0)
MCV: 92.3 fL (ref 80.0–100.0)
Monocytes Absolute: 0.9 K/uL (ref 0.1–1.0)
Monocytes Relative: 9 %
Neutro Abs: 5.5 K/uL (ref 1.7–7.7)
Neutrophils Relative %: 59 %
Platelets: 389 K/uL (ref 150–400)
RBC: 2.6 MIL/uL — ABNORMAL LOW (ref 3.87–5.11)
RDW: 13.1 % (ref 11.5–15.5)
WBC: 9.5 K/uL (ref 4.0–10.5)
nRBC: 0 % (ref 0.0–0.2)

## 2024-02-28 LAB — GLUCOSE, CAPILLARY
Glucose-Capillary: 348 mg/dL — ABNORMAL HIGH (ref 70–99)
Glucose-Capillary: 415 mg/dL — ABNORMAL HIGH (ref 70–99)
Glucose-Capillary: 354 mg/dL — ABNORMAL HIGH (ref 70–99)
Glucose-Capillary: 242 mg/dL — ABNORMAL HIGH (ref 70–99)

## 2024-02-28 LAB — COMPREHENSIVE METABOLIC PANEL WITH GFR
ALT: 6 U/L (ref 0–44)
AST: 11 U/L — ABNORMAL LOW (ref 15–41)
Albumin: 2.1 g/dL — ABNORMAL LOW (ref 3.5–5.0)
Alkaline Phosphatase: 90 U/L (ref 38–126)
Anion gap: 12 (ref 5–15)
BUN: 28 mg/dL — ABNORMAL HIGH (ref 6–20)
CO2: 25 mmol/L (ref 22–32)
Calcium: 7.5 mg/dL — ABNORMAL LOW (ref 8.9–10.3)
Chloride: 95 mmol/L — ABNORMAL LOW (ref 98–111)
Creatinine, Ser: 2.11 mg/dL — ABNORMAL HIGH (ref 0.44–1.00)
GFR, Estimated: 29 mL/min — ABNORMAL LOW (ref >60)
Glucose, Bld: 319 mg/dL — ABNORMAL HIGH (ref 70–99)
Potassium: 3.8 mmol/L (ref 3.5–5.1)
Sodium: 132 mmol/L — ABNORMAL LOW (ref 135–145)
Total Bilirubin: 0.7 mg/dL (ref 0.0–1.2)
Total Protein: 7 g/dL (ref 6.5–8.1)

## 2024-02-28 LAB — PHOSPHORUS: Phosphorus: 3.9 mg/dL (ref 2.5–4.6)

## 2024-02-28 LAB — MAGNESIUM: Magnesium: 1.3 mg/dL — ABNORMAL LOW (ref 1.7–2.4)

## 2024-02-28 MED ORDER — INSULIN ASPART PROT & ASPART (70-30 MIX) 100 UNIT/ML PEN: 40.0000 [IU] | PEN_INJECTOR | Freq: Two times a day (BID) | SUBCUTANEOUS | Status: AC

## 2024-02-28 MED ORDER — INSULIN ASPART PROT & ASPART (70-30 MIX) 100 UNIT/ML PEN: 40.0000 [IU] | PEN_INJECTOR | Freq: Two times a day (BID) | SUBCUTANEOUS | Status: DC

## 2024-02-28 MED ORDER — INSULIN GLARGINE-YFGN 100 UNIT/ML ~~LOC~~ SOLN
50.0000 [IU] | Freq: Every day | SUBCUTANEOUS | Status: DC
  Administered 2024-02-28: 50 [IU] via SUBCUTANEOUS
  Filled 2024-02-28: qty 0.5

## 2024-02-28 NOTE — Progress Notes (Signed)
 Pt discharged to home, PIV x 2 removed. Discharge instructions reviewed with patient and all questions and concerns answered.

## 2024-02-28 NOTE — Evaluation (Signed)
 Physical Therapy Evaluation Patient Details Name: Laura Escobar MRN: [id number] DOB: December 04, 1980 Today's Date: 02/28/2024  History of Present Illness  43 y/o female presented to ED on 02/25/24 for R foot pain and swelling. Recent R 3rd toe amputation at [facility] on 6/1. Admitted for R foot osteomyelitis and hyperosmolar hyperglycemic state.  Clinical Impression  Patient admitted with the above. PTA, patient recently discharged home from rehab and was utilizing RW for mobility. She lives in a 2nd floor apartment with flight of stairs to access. Patient presents with weakness, impaired balance, and decreased activity tolerance. Able to complete bed mobility with supervision and utilizing BUE to assist R LE off bed. TotalA to don post op shoe. Stood from EOB with CGA and ambulated ~10' with RW and minA, however further mobility deferred due to R foot severely supinates and inverts out of the shoe with potential for injuring her R ankle due to impaired sensation. MD notified for possible options for boot for safe mobility. Patient will benefit from skilled PT services during acute stay to address listed deficits. Patient will benefit from ongoing therapy at discharge to maximize functional independence and safety.          If plan is discharge home, recommend the following: A little help with walking and/or transfers;Assistance with cooking/housework;Help with stairs or ramp for entrance;Assist for transportation   Can travel by private vehicle   Yes    Equipment Recommendations None recommended by PT  Recommendations for Other Services       Functional Status Assessment Patient has had a recent decline in their functional status and demonstrates the ability to make significant improvements in function in a reasonable and predictable amount of time.     Precautions / Restrictions Precautions Precautions: Fall Recall of Precautions/Restrictions: Intact Restrictions Weight Bearing Restrictions Per  Provider Order: Yes RLE Weight Bearing Per Provider Order:  (Heel WB in post op shoe)      Mobility  Bed Mobility Overal bed mobility: Needs Assistance Bed Mobility: Supine to Sit     Supine to sit: Supervision     General bed mobility comments: seated on EOB at end of session. utilized UEs to assist R LE off bed    Transfers Overall transfer level: Needs assistance Equipment used: Rolling Markesha Hannig (2 wheels) Transfers: Sit to/from Stand Sit to Stand: Contact guard assist                Ambulation/Gait Ambulation/Gait assistance: Min assist Gait Distance (Feet): 10 Feet Assistive device: Rolling Zhoey Blackstock (2 wheels) Gait Pattern/deviations: Step-to pattern, Decreased stride length Gait velocity: decreased     General Gait Details: posterior LOB x 1 during short ambulation. R foot supinating and inverting out of post op shoe despite proper adjustment and fitting. deferred further mobility and notified MD of other options  Stairs            Wheelchair Mobility     Tilt Bed    Modified Rankin (Stroke Patients Only)       Balance Overall balance assessment: Needs assistance Sitting-balance support: No upper extremity supported, Feet supported Sitting balance-Leahy Scale: Normal     Standing balance support: Bilateral upper extremity supported, During functional activity Standing balance-Leahy Scale: Fair                               Pertinent Vitals/Pain Pain Assessment Pain Assessment: No/denies pain    Home Living Family/patient expects to  be discharged to:: Private residence Living Arrangements: Alone Available Help at Discharge: Family;Available 24 hours/day Type of Home: Apartment Home Access: Stairs to enter Entrance Stairs-Rails: Right;Left;Can reach both Entrance Stairs-Number of Steps: 15   Home Layout: One level Home Equipment: Agricultural consultant (2 wheels)      Prior Function Prior Level of Function : Independent/Modified  Independent             Mobility Comments: prior to 3rd toe amputation, she was independent with no AD       Extremity/Trunk Assessment   Upper Extremity Assessment Upper Extremity Assessment: Overall WFL for tasks assessed    Lower Extremity Assessment Lower Extremity Assessment: Generalized weakness;RLE deficits/detail RLE Deficits / Details: R forefoot wrapped with ACE wrap RLE Sensation: decreased light touch    Cervical / Trunk Assessment Cervical / Trunk Assessment: Normal  Communication   Communication Communication: No apparent difficulties    Cognition Arousal: Alert Behavior During Therapy: Flat affect   PT - Cognitive impairments: No apparent impairments                         Following commands: Intact       Cueing Cueing Techniques: Verbal cues     General Comments      Exercises     Assessment/Plan    PT Assessment Patient needs continued PT services  PT Problem List Decreased strength;Decreased activity tolerance;Decreased balance;Decreased mobility;Decreased knowledge of use of DME;Decreased safety awareness;Decreased knowledge of precautions;Pain;Impaired sensation       PT Treatment Interventions DME instruction;Stair training;Gait training;Functional mobility training;Therapeutic activities;Therapeutic exercise;Balance training;Patient/family education    PT Goals (Current goals can be found in the Care Plan section)  Acute Rehab PT Goals Patient Stated Goal: to go home PT Goal Formulation: With patient Time For Goal Achievement: 03/13/24 Potential to Achieve Goals: Fair    Frequency Min 3X/week     Co-evaluation               AM-PAC PT 6 Clicks Mobility  Outcome Measure Help needed turning from your back to your side while in a flat bed without using bedrails?: A Little Help needed moving from lying on your back to sitting on the side of a flat bed without using bedrails?: A Little Help needed moving to  and from a bed to a chair (including a wheelchair)?: A Little Help needed standing up from a chair using your arms (e.g., wheelchair or bedside chair)?: A Little Help needed to walk in hospital room?: A Little Help needed climbing 3-5 steps with a railing? : A Lot 6 Click Score: 17    End of Session   Activity Tolerance: Patient tolerated treatment well Patient left: in bed;with call bell/phone within reach;with bed alarm set Nurse Communication: Mobility status PT Visit Diagnosis: Unsteadiness on feet (R26.81);Muscle weakness (generalized) (M62.81);Other abnormalities of gait and mobility (R26.89)    Time: 9094-9080 PT Time Calculation (min) (ACUTE ONLY): 14 min   Charges:   PT Evaluation $PT Eval Moderate Complexity: 1 Mod   PT General Charges $$ ACUTE PT VISIT: 1 Visit         Maryanne Finder, PT, DPT Physical Therapist - [facility]  [facility]   Keajah Killough A Yezenia Fredrick 02/28/2024, 9:29 AM

## 2024-02-28 NOTE — Discharge Summary (Signed)
 DISCHARGE SUMMARY    Laura Escobar FMW:[id number] DOB: 05/16/1981 DOA: 02/25/2024  PCP: Steva Clotilda DEL, NP  Admit date: 02/25/2024 Discharge date: 02/28/2024   Recommendations for Outpatient Follow-up:  Follow up with PCP in 1-2 weeks to recheck kidney function and ensure stability Follow-up with podiatry for postop follow-up and continued wound monitoring Endocrinology follow-up for better blood glucose control Home health ordered for PT/OT/home health aide   Hospital Course: Laura Escobar is a 43 year old female with poorly controlled diabetes, chronic foot osteomyelitis, hypertension, hyperlipidemia, diastolic CHF, depression, bipolar, CKD stage III, Addison's disease, gastroparesis, morbid obesity with BMI 49, who presents with right foot pain and swelling.  Patient was recently hospitalized at [facility] due to toe osteomyelitis and bacteremia on 6/15.  She is status post right third toe amputation on 6/1.  OR cultures at that time grew staph dysgalactiae and MSSA.  She was admitted to complete a course of cefazolin  through 7/1 and switched to linezolid at discharge.  Patient was admitted to rehab but signed out AMA prior to completion of antibiotics.  She did not continue taking antibiotics at home.  She reports that since that time she has had worsening right foot pain and swelling.  Patient had follow-up appointment with her primary care physician on 7/8 and was referred to the ED for further evaluation.  On arrival to the ED she was found to have a lactic acid of 2.1, WBC 12.3, blood sugar 743 with an anion gap of 13 and pseudohyponatremia.  She was admitted for management of HHS and podiatry was consulted for osteomyelitis.  Patient was started on insulin  drip and gradually blood glucose normalized.  She was then transition to basal/bolus.  Hemoglobin A1c found to be 10.6%.  She met with diabetes coordinator and reports that she is currently taking 70/30 and like to continue her current  regimen.  She is followed closely by endocrinology. Podiatry evaluated the patient and determined no acute erosive changes at this time and has recommended for antibiotic discontinuation and close outpatient follow-up in the clinic. By evaluation on 7/11 patient reported feeling close to her physiologic baseline and ready for discharge.  Right foot osteomyelitis Severe peripheral vascular disease Status post right third toe amputation recently Diabetic neuropathy Charcot neuroarthropathy, status post prior reconstruction - Podiatry consulted.  Does not believe there is acute erosive changes at this time - Podiatry has recommended weightbearing to heel and minimal ambulation, should use walker.  Foot wound has been redressed - No plans for surgical debridement at this time.  Small ulcer at tip of great toe to monitor closely. - Patient has been afebrile.  Antibiotics have been discontinued for now given no acute concern of infection per podiatry - Continue with pain management.  And close outpatient follow-up   HHS Uncontrolled type 2 diabetes - Was initially on insulin  drip Anion gap now closed.  Have transition to basal/bolus - Patient did receive 100 mg Solu-Cortef  on arrival causing acute worsening of hyperglycemia - Hemoglobin A1c 10.6% -Patient is taking 7030 at home.  We discussed elevated A1c and need for tighter glucose control.  She endorses understanding and agrees.  Gastroparesis - Reglan  PRN   Essential hypertension - Resume diuretics.  Holding losartan  and Maxine given low/normal BPs and kidney dysfunction  Hyperlipidemia - Continue statin   Chronic diastolic heart failure - Most recent echocardiogram June 2025: EF of 55% - Clinically euvolemic at this time.  Has unilateral right leg swelling which she reports is chronic -  Resume home diuretics.   CKD stage IIIa - Patient had recent AKI while admitted 2 weeks ago.  Creatinine now is improved from that baseline but  uptrending again.  I suspect her baseline is near 2.0 - Follow-up with PCP to recheck creatinine in 1 week - Renally dosed with a creatinine clearance of 51 when needed   Pain and swelling of right lower leg - Likely secondary to chronic lymphedema and recurrent operations - Doppler: neg for DVT   History of Addison's disease - Has received 100 mg Solu-Cortef  stress dose on arrival - Is not on home steroid therapy   Obesity, class III BMI 49 -- Outpatient follow up for lifestyle modification and risk factor management   Pseudohyponatremia - Expect resolution as glucose resolves   History of bacteremia - blood cultures taken on arrival - Negative to date, no growth at 3 days.  Will follow to completion.  Discharge Instructions  Discharge Instructions     Call MD for:  difficulty breathing, headache or visual disturbances   Complete by: As directed    Call MD for:  persistant dizziness or light-headedness   Complete by: As directed    Call MD for:  persistant nausea and vomiting   Complete by: As directed    Call MD for:  severe uncontrolled pain   Complete by: As directed    Call MD for:  temperature >100.4   Complete by: As directed    Diet general   Complete by: As directed    Discharge instructions   Complete by: As directed    We have held your losartan  and maxzide, currently these medications may be too hard on your kidneys.  Please keep a blood pressure log and follow-up with your primary care physician to review your blood pressure and determine further Blood pressure needs. Please also repeat your kidney function with your primary care physician within 1 week to ensure it is not worsening.  Please follow-up with podiatry in the clinic for continued postoperative monitoring and have staple removal when appropriate.  Follow up with your primary care physician to discuss the medication changes during this admission   Increase activity slowly   Complete by: As  directed    No wound care   Complete by: As directed       Allergies as of 02/28/2024       Reactions   Amlodipine  Anaphylaxis, Swelling   Metformin And Related    Olanzapine Anxiety   Pulled hair out        Medication List     PAUSE taking these medications    losartan  100 MG tablet Wait to take this until your doctor or other care provider tells you to start again. Commonly known as: COZAAR  Take 50 mg by mouth daily.   triamterene-hydrochlorothiazide 37.5-25 MG tablet Wait to take this until your doctor or other care provider tells you to start again. Commonly known as: MAXZIDE-25 Take 1 tablet by mouth daily.       TAKE these medications    atorvastatin  20 MG tablet Commonly known as: LIPITOR Take 20 mg by mouth daily.   diclofenac  Sodium 1 % Gel Commonly known as: VOLTAREN  Apply 2 g topically 4 (four) times daily as needed.   doxepin  10 MG capsule Commonly known as: SINEQUAN  Take 10 mg by mouth at bedtime.   FLUoxetine  20 MG capsule Commonly known as: PROZAC  Take 20 mg by mouth daily.   insulin  aspart protamine - aspart (70-30)  100 UNIT/ML FlexPen Commonly known as: NOVOLOG  70/30 MIX Inject 40 Units into the skin 2 (two) times daily. WITH breakfast and WITH dinner   LORazepam  1 MG tablet Commonly known as: ATIVAN  Take 1 mg by mouth every 8 (eight) hours as needed for anxiety.   metolazone  2.5 MG tablet Commonly known as: ZAROXOLYN  Take 2.5 mg by mouth every other day.   nicotine  14 mg/24hr patch Commonly known as: NICODERM CQ  - dosed in mg/24 hours Place 1 patch onto the skin.  Place 1 patch onto the skin once daily   pantoprazole  40 MG tablet Commonly known as: PROTONIX  Take 40 mg by mouth daily before breakfast.   polyethylene glycol 17 g packet Commonly known as: MIRALAX  / GLYCOLAX  Take 17 g by mouth daily.   senna 8.6 MG tablet Commonly known as: SENOKOT Take 2 tablets by mouth at bedtime as needed for constipation.   torsemide   20 MG tablet Commonly known as: DEMADEX  Take 20 mg by mouth daily.        Allergies  Allergen Reactions   Amlodipine  Anaphylaxis and Swelling   Metformin And Related    Olanzapine Anxiety    Pulled hair out    Consultations: Treatment Team:  Ashley Soulier, DPM   Procedures/Studies: US  Venous Img Lower Unilateral Right (DVT) Result Date: 02/26/2024 CLINICAL DATA:  Right leg swelling EXAM: RIGHT LOWER EXTREMITY VENOUS DOPPLER ULTRASOUND TECHNIQUE: Gray-scale sonography with compression, as well as color and duplex ultrasound, were performed to evaluate the deep venous system(s) from the level of the common femoral vein through the popliteal and proximal calf veins. COMPARISON:  None Available. FINDINGS: VENOUS Normal compressibility of the common femoral, superficial femoral, and popliteal veins, as well as the visualized calf veins. Visualized portions of profunda femoral vein and great saphenous vein unremarkable. No filling defects to suggest DVT on grayscale or color Doppler imaging. Doppler waveforms show normal direction of venous flow, normal respiratory plasticity and response to augmentation. Limited views of the contralateral common femoral vein are unremarkable. OTHER Right popliteal fossa cyst measures 3 x 2.4 x 1.2 cm. Limitations: Limited visualization of the calf veins. IMPRESSION: No evidence of right lower extremity DVT. Right Baker's cyst. Electronically Signed   By: Franky Crease M.D.   On: 02/26/2024 02:50   DG Foot Complete Right Result Date: 02/25/2024 CLINICAL DATA:  Postsurgical changes of right toe amputation with persistent right foot swelling EXAM: RIGHT FOOT COMPLETE - 3 VIEW COMPARISON:  Right foot radiograph dated 09/05/2021 FINDINGS: Postsurgical changes of first, second, third, and fifth toe amputations. Intramedullary rod placement is also seen across the first and fourth tarsometatarsal joints. Hardware appear intact. Similar uniform lucency surrounding the  distal threaded portions of the rods. Diffuse soft tissue swelling of the foot. Subtle lucency of the third metatarsal head. Degenerative changes of the hindfoot with similar appearance of Charcot joint. IMPRESSION: 1. Subtle lucency of the third metatarsal head, which may represent osteomyelitis. 2. Postsurgical changes of first, second, third, and fifth toe amputations. 3. Similar uniform lucency surrounding the distal threaded portions of the first and fourth metatarsal rods, which may be seen in the setting of loosening. Electronically Signed   By: Limin  Xu M.D.   On: 02/25/2024 19:04      Discharge Exam: Vitals:   02/28/24 0445 02/28/24 0825  BP: (!) 112/55 124/64  Pulse: 90 (!) 101  Resp: 16 16  Temp: 98.4 F (36.9 C) 98.3 F (36.8 C)  SpO2: 98% 100%   Vitals:  02/27/24 2025 02/27/24 2352 02/28/24 0445 02/28/24 0825  BP: (!) 104/51 (!) 112/50 (!) 112/55 124/64  Pulse: 95 98 90 (!) 101  Resp: 17 17 16 16   Temp: 98.4 F (36.9 C)  98.4 F (36.9 C) 98.3 F (36.8 C)  TempSrc:   Oral Oral  SpO2: 100% 100% 98% 100%  Weight:      Height:       General exam: Appears calm and comfortable, NAD  Respiratory system: No work of breathing, symmetric chest wall expansion Cardiovascular system: S1 & S2 heard, RRR.  Gastrointestinal system: Abdomen is nondistended, soft and nontender.  Neuro: Alert and oriented. No focal neurological deficits. Extremities: Right leg with diffuse swelling, chronic skin changes, significant skin flaking.  Right foot wound with Ace bandage, C/D/I Psychiatry: Demonstrates appropriate judgement and insight. Mood & affect appropriate for situation.   The results of significant diagnostics from this hospitalization (including imaging, microbiology, ancillary and laboratory) are listed below for reference.     Microbiology: Recent Results (from the past 240 hours)  Culture, blood (routine x 2)     Status: None (Preliminary result)   Collection Time:  02/25/24  8:33 PM   Specimen: BLOOD  Result Value Ref Range Status   Specimen Description BLOOD BLOOD LEFT ARM  Final   Special Requests   Final    BOTTLES DRAWN AEROBIC AND ANAEROBIC Blood Culture adequate volume   Culture   Final    NO GROWTH 3 DAYS Performed at [facility], [address] [place], [place] [address]    Report Status PENDING  Incomplete  Culture, blood (routine x 2)     Status: None (Preliminary result)   Collection Time: 02/25/24  8:33 PM   Specimen: BLOOD  Result Value Ref Range Status   Specimen Description BLOOD RIGHT HAND  Final   Special Requests   Final    BOTTLES DRAWN AEROBIC AND ANAEROBIC Blood Culture results may not be optimal due to an inadequate volume of blood received in culture bottles   Culture   Final    NO GROWTH 3 DAYS Performed at [facility], [address] [place], [place] [address]    Report Status PENDING  Incomplete     Labs: BNP (last 3 results) Recent Labs    02/25/24 1818  BNP 22.1   Basic Metabolic Panel: Recent Labs  Lab 02/26/24 0509 02/26/24 0908 02/26/24 1757 02/27/24 0254 02/28/24 0504  NA 130* 129* 130* 130* 132*  K 3.6 4.2 4.0 3.3* 3.8  CL 92* 93* 92* 94* 95*  CO2 26 25 24 25 25   GLUCOSE 231* 213* 283* 296* 319*  BUN 22* 23* 25* 27* 28*  CREATININE 2.01* 1.85* 1.81* 2.34* 2.11*  CALCIUM  8.0* 8.1* 7.9* 7.5* 7.5*  MG  --   --   --  1.0* 1.3*  PHOS  --   --   --  3.3 3.9   Liver Function Tests: Recent Labs  Lab 02/25/24 1818 02/27/24 0254 02/28/24 0504  AST 15 14* 11*  ALT 10 7 6   ALKPHOS 129* 96 90  BILITOT 0.5 0.4 0.7  PROT 8.2* 7.1 7.0  ALBUMIN 2.5* 2.1* 2.1*   No results for input(s): LIPASE, AMYLASE in the last 168 hours. No results for input(s): AMMONIA in the last 168 hours. CBC: Recent Labs  Lab 02/25/24 1818 02/26/24 0509 02/27/24 0254 02/28/24 0504  WBC 12.3* 12.6* 12.2* 9.5  NEUTROABS 8.5*  --  7.8* 5.5  HGB 8.5* 8.6*  7.7* 7.6*  HCT 26.7* 26.6*  24.3* 24.0*  MCV 94.7 93.3 92.4 92.3  PLT 472* 385 388 389   Cardiac Enzymes: No results for input(s): CKTOTAL, CKMB, CKMBINDEX, TROPONINI in the last 168 hours. BNP: Invalid input(s): POCBNP CBG: Recent Labs  Lab 02/27/24 1631 02/27/24 2201 02/28/24 0807 02/28/24 1022 02/28/24 1141  GLUCAP 121* 224* 348* 415* 354*   D-Dimer No results for input(s): DDIMER in the last 72 hours. Hgb A1c Recent Labs    02/26/24 0908  HGBA1C 10.6*   Lipid Profile No results for input(s): CHOL, HDL, LDLCALC, TRIG, CHOLHDL, LDLDIRECT in the last 72 hours. Thyroid function studies No results for input(s): TSH, T4TOTAL, T3FREE, THYROIDAB in the last 72 hours.  Invalid input(s): FREET3 Anemia work up Recent Labs    02/26/24 1757 02/27/24 0254  VITAMINB12 561  --   FOLATE  --  7.1  FERRITIN  --  168  TIBC  --  146*  IRON  --  35  RETICCTPCT  --  3.6*   Urinalysis    Component Value Date/Time   COLORURINE YELLOW 09/06/2021 0117   APPEARANCEUR CLEAR 09/06/2021 0117   APPEARANCEUR CLEAR 08/18/2012 1504   LABSPEC 1.015 09/06/2021 0117   LABSPEC 1.020 08/18/2012 1504   PHURINE 5.5 09/06/2021 0117   GLUCOSEU >1,000 (A) 09/06/2021 0117   GLUCOSEU >/= 2000 mg/dL 87/69/7986 8495   HGBUR NEGATIVE 09/06/2021 0117   BILIRUBINUR NEGATIVE 09/06/2021 0117   BILIRUBINUR NEGATIVE 08/18/2012 1504   KETONESUR TRACE (A) 09/06/2021 0117   PROTEINUR NEGATIVE 09/06/2021 0117   NITRITE NEGATIVE 09/06/2021 0117   LEUKOCYTESUR NEGATIVE 09/06/2021 0117   LEUKOCYTESUR NEGATIVE 08/18/2012 1504   Sepsis Labs Recent Labs  Lab 02/25/24 1818 02/26/24 0509 02/27/24 0254 02/28/24 0504  WBC 12.3* 12.6* 12.2* 9.5   Microbiology Recent Results (from the past 240 hours)  Culture, blood (routine x 2)     Status: None (Preliminary result)   Collection Time: 02/25/24  8:33 PM   Specimen: BLOOD  Result Value Ref Range Status   Specimen Description BLOOD BLOOD LEFT ARM   Final   Special Requests   Final    BOTTLES DRAWN AEROBIC AND ANAEROBIC Blood Culture adequate volume   Culture   Final    NO GROWTH 3 DAYS Performed at [facility], [address] [place], [place] [address]    Report Status PENDING  Incomplete  Culture, blood (routine x 2)     Status: None (Preliminary result)   Collection Time: 02/25/24  8:33 PM   Specimen: BLOOD  Result Value Ref Range Status   Specimen Description BLOOD RIGHT HAND  Final   Special Requests   Final    BOTTLES DRAWN AEROBIC AND ANAEROBIC Blood Culture results may not be optimal due to an inadequate volume of blood received in culture bottles   Culture   Final    NO GROWTH 3 DAYS Performed at [facility], [address] [place], [place] [address]    Report Status PENDING  Incomplete     Time coordinating discharge: 32 min   SIGNED: Lorane Poland, MD  [facility] 02/28/2024, 1:44 PM Pager   If 7PM-7AM, please contact night-coverage

## 2024-02-28 NOTE — TOC Transition Note (Addendum)
 Transition of Care [facility]) - Discharge Note   Patient Details  Name: Laura Escobar MRN: [id number] Date of Birth: [birth year]  [facility] CM/SW Contact:  [place] L Audrinna Sherman, LCSW Phone Number: 02/28/2024, 1:52 PM   Clinical Narrative:     CSW met with patient to discuss HH options. Patient had no preferences and advised CSW to locate an agency.   CSW spoke with [place] from [place]. HH in place for PT/OT. Patient notified that services will begin over the weekend.         Patient Goals and CMS Choice            Discharge Placement                       Discharge Plan and Services Additional resources added to the After Visit Summary for                                       Social Drivers of Health (SDOH) Interventions SDOH Screenings   Food Insecurity: No Food Insecurity (02/26/2024)  Housing: Low Risk  (02/26/2024)  Transportation Needs: No Transportation Needs (02/26/2024)  Utilities: Not At Risk (02/26/2024)  Alcohol Screen: Low Risk  (03/24/2019)  Depression (PHQ2-9): Low Risk  (03/24/2019)  Financial Resource Strain: Low Risk  (02/04/2024)   Received from [facility]  Physical Activity: Insufficiently Active (03/08/2023)   Received from [facility]  Social Connections: Socially Isolated (03/08/2023)   Received from [facility]  Stress: Stress Concern Present (03/08/2023)   Received from [facility]  Tobacco Use: Medium Risk (02/27/2024)  Health Literacy: Medium Risk (03/08/2023)   Received from [facility]     Readmission Risk Interventions     No data to display

## 2024-02-28 NOTE — Plan of Care (Signed)

## 2024-03-01 LAB — CULTURE, BLOOD (ROUTINE X 2)
Culture: NO GROWTH
Culture: NO GROWTH
Special Requests: ADEQUATE

## 2024-03-07 NOTE — Telephone Encounter (Signed)
-------------------------------------------------------------------------------   Summary: Home health -------------------------------------------------------------------------------  Thank you for your Home Health referral. [facility] and [facility] are unable to accommodate (at this time )the ordered Home Health services for your patient. Currently we have reached our capacity level I have listed some agencies that may have capacity . SABRA  In Network Providers: [facility] [phone] (link: [phone])    [facility] [phone] (link: tel:[phone])    [facility] [phone] (link: [phone])    [facility] [phone] (link: [phone])    [facility] [phone] (link: [phone])       [facility] [phone] (link: [phone])    [facility]  [phone]   [facility]- [phone] Med Home [facility] [phone]  [facility]- [phone]  Kenney Bers, LPN (TEAM LEAD) Central Intake Nurse for [facility] DME AND [facility] [phone]

## 2024-03-11 ENCOUNTER — Other Ambulatory Visit: Payer: Self-pay

## 2024-03-11 ENCOUNTER — Emergency Department
Admission: EM | Admit: 2024-03-11 | Discharge: 2024-03-11 | Disposition: A | Discharge status: Home / Self Care | Attending: Emergency Medicine | Admitting: Emergency Medicine

## 2024-03-11 DIAGNOSIS — K3184 Gastroparesis: Secondary | ICD-10-CM | POA: Diagnosis not present

## 2024-03-11 DIAGNOSIS — R112 Nausea with vomiting, unspecified: Secondary | ICD-10-CM | POA: Diagnosis present

## 2024-03-11 DIAGNOSIS — E119 Type 2 diabetes mellitus without complications: Secondary | ICD-10-CM | POA: Diagnosis not present

## 2024-03-11 LAB — LACTIC ACID, PLASMA: Lactic Acid, Venous: 1.2 mmol/L (ref 0.5–1.9)

## 2024-03-11 LAB — COMPREHENSIVE METABOLIC PANEL WITH GFR
ALT: 12 U/L (ref 0–44)
AST: 18 U/L (ref 15–41)
Albumin: 3 g/dL — ABNORMAL LOW (ref 3.5–5.0)
Alkaline Phosphatase: 120 U/L (ref 38–126)
Anion gap: 14 (ref 5–15)
BUN: 20 mg/dL (ref 6–20)
CO2: 25 mmol/L (ref 22–32)
Calcium: 8.6 mg/dL — ABNORMAL LOW (ref 8.9–10.3)
Chloride: 96 mmol/L — ABNORMAL LOW (ref 98–111)
Creatinine, Ser: 1.38 mg/dL — ABNORMAL HIGH (ref 0.44–1.00)
GFR, Estimated: 48 mL/min — ABNORMAL LOW (ref >60)
Glucose, Bld: 216 mg/dL — ABNORMAL HIGH (ref 70–99)
Potassium: 3.7 mmol/L (ref 3.5–5.1)
Sodium: 135 mmol/L (ref 135–145)
Total Bilirubin: 1 mg/dL (ref 0.0–1.2)
Total Protein: 9.1 g/dL — ABNORMAL HIGH (ref 6.5–8.1)

## 2024-03-11 LAB — CBC WITH DIFFERENTIAL/PLATELET
Abs Immature Granulocytes: 0.04 K/uL (ref 0.00–0.07)
Basophils Absolute: 0 K/uL (ref 0.0–0.1)
Basophils Relative: 0 %
Eosinophils Absolute: 0.1 K/uL (ref 0.0–0.5)
Eosinophils Relative: 2 %
HCT: 32.5 % — ABNORMAL LOW (ref 36.0–46.0)
Hemoglobin: 10.3 g/dL — ABNORMAL LOW (ref 12.0–15.0)
Immature Granulocytes: 0 %
Lymphocytes Relative: 23 %
Lymphs Abs: 2.2 K/uL (ref 0.7–4.0)
MCH: 29.1 pg (ref 26.0–34.0)
MCHC: 31.7 g/dL (ref 30.0–36.0)
MCV: 91.8 fL (ref 80.0–100.0)
Monocytes Absolute: 0.5 K/uL (ref 0.1–1.0)
Monocytes Relative: 5 %
Neutro Abs: 6.6 K/uL (ref 1.7–7.7)
Neutrophils Relative %: 70 %
Platelets: 396 K/uL (ref 150–400)
RBC: 3.54 MIL/uL — ABNORMAL LOW (ref 3.87–5.11)
RDW: 12.4 % (ref 11.5–15.5)
WBC: 9.5 K/uL (ref 4.0–10.5)
nRBC: 0 % (ref 0.0–0.2)

## 2024-03-11 LAB — LIPASE, BLOOD: Lipase: 31 U/L (ref 11–51)

## 2024-03-11 MED ORDER — SODIUM CHLORIDE 0.9 % IV SOLN: Freq: Once | INTRAVENOUS | Status: AC

## 2024-03-11 MED ORDER — SODIUM CHLORIDE 0.9 % IV SOLN
8.0000 mg | Freq: Once | INTRAVENOUS | Status: AC
  Administered 2024-03-11: 8 mg via INTRAVENOUS
  Filled 2024-03-11: qty 4

## 2024-03-11 MED ORDER — ONDANSETRON 4 MG PO TBDP
4.0000 mg | ORAL_TABLET | Freq: Three times a day (TID) | ORAL | 0 refills | Status: AC | PRN
Start: 2024-03-11 — End: ?

## 2024-03-11 NOTE — ED Provider Notes (Signed)
 [facility] Provider Note    Event Date/Time   First MD Initiated Contact with Patient 03/11/24 1005     (approximate)   History   Abdominal Pain   HPI  JANNA OAK is a 43 y.o. female with extensive past medical history related to diabetes, including osteomyelitis, DKA, gastroparesis who presents with complaints of nausea and vomiting over the last several days worsened last night.  She suspects this is related to her gastroparesis.  No fevers.  Recent amputation secondary to diabetic foot infection     Physical Exam   Triage Vital Signs: ED Triage Vitals  Encounter Vitals Group     BP 03/11/24 1006 (!) 154/85     Girls Systolic BP Percentile --      Girls Diastolic BP Percentile --      Boys Systolic BP Percentile --      Boys Diastolic BP Percentile --      Pulse Rate 03/11/24 1006 (!) 106     Resp 03/11/24 1006 20     Temp 03/11/24 1006 98.3 F (36.8 C)     Temp Source 03/11/24 1006 Oral     SpO2 03/11/24 1006 100 %     Weight 03/11/24 1005 (!) 145.2 kg (320 lb 1.7 oz)     Height --      Head Circumference --      Peak Flow --      Pain Score 03/11/24 1004 8     Pain Loc --      Pain Education --      Exclude from Growth Chart --     Most recent vital signs: Vitals:   03/11/24 1006  BP: (!) 154/85  Pulse: (!) 106  Resp: 20  Temp: 98.3 F (36.8 C)  SpO2: 100%     General: Awake, no distress.  CV:  Good peripheral perfusion.  Resp:  Normal effort.  Abd:  No distention.  Soft, nontender Other:  Right foot: Diffusely swollen, sutures still in place, no erythema or discharge to suggest infection   ED Results / Procedures / Treatments   Labs (all labs ordered are listed, but only abnormal results are displayed) Labs Reviewed  COMPREHENSIVE METABOLIC PANEL WITH GFR - Abnormal; Notable for the following components:      Result Value   Chloride 96 (*)    Glucose, Bld 216 (*)    Creatinine, Ser 1.38 (*)    Calcium  8.6  (*)    Total Protein 9.1 (*)    Albumin 3.0 (*)    GFR, Estimated 48 (*)    All other components within normal limits  CBC WITH DIFFERENTIAL/PLATELET - Abnormal; Notable for the following components:   RBC 3.54 (*)    Hemoglobin 10.3 (*)    HCT 32.5 (*)    All other components within normal limits  LACTIC ACID, PLASMA  LIPASE, BLOOD  URINALYSIS, W/ REFLEX TO CULTURE (INFECTION SUSPECTED)     EKG  ED ECG REPORT I, Lamar Price, the attending physician, personally viewed and interpreted this ECG.  Date: 03/11/2024  Rhythm: Sinus tachycardia QRS Axis: normal Intervals: normal ST/T Wave abnormalities: normal Narrative Interpretation: no evidence of acute ischemia    RADIOLOGY     PROCEDURES:  Critical Care performed:   Procedures   MEDICATIONS ORDERED IN ED: Medications  ondansetron  (ZOFRAN ) 8 mg in sodium chloride  0.9 % 50 mL IVPB (8 mg Intravenous New Bag/Given 03/11/24 1112)  0.9 %  sodium chloride   infusion ( Intravenous New Bag/Given 03/11/24 1112)     IMPRESSION / MDM / ASSESSMENT AND PLAN / ED COURSE  I reviewed the triage vital signs and the nursing notes. Patient's presentation is most consistent with exacerbation of chronic illness.  Patient resents with nausea vomiting as detailed above, she has tachycardic.  Differential includes gastroparesis, DKA, hyperglycemia, infection  Will treat with IV fluids, IV Zofran , obtain labs including lactic acid and reevaluate  ----------------------------------------- 12:32 PM on 03/11/2024 ----------------------------------------- Patient reports her nausea is resolved, she is feeling much better.  Lab work is reassuring, lactic is normal.  She has follow-up with Dr. Ashley of podiatry.  No indication for admission at this time, emphasized the need for outpatient follow-up.  Review of records demonstrates the patient has home health being arranged via [facility] health      FINAL CLINICAL IMPRESSION(S) / ED DIAGNOSES    Final diagnoses:  Gastroparesis     Rx / DC Orders   ED Discharge Orders          Ordered    ondansetron  (ZOFRAN -ODT) 4 MG disintegrating tablet  Every 8 hours PRN        03/11/24 1231             Note:  This document was prepared using Dragon voice recognition software and may include unintentional dictation errors.   Arlander Charleston, MD 03/11/24 [phone]

## 2024-03-11 NOTE — ED Triage Notes (Addendum)
 C/O abd pain and N/V yesterday. Today also feeling weak.  On EMS arrival, patient sitting on bed. Recent toe amputation with post op infection. D/C from hosptial 02/28/2024. No fever  P:  110 RR: 26 ETCO2 24 SpO2 100 CBG: 134 191/93

## 2024-06-04 ENCOUNTER — Emergency Department
Admission: EM | Admit: 2024-06-04 | Discharge: 2024-06-04 | Disposition: A | Discharge status: Home / Self Care | Attending: Emergency Medicine | Admitting: Emergency Medicine

## 2024-06-04 ENCOUNTER — Emergency Department

## 2024-06-04 ENCOUNTER — Other Ambulatory Visit: Payer: Self-pay

## 2024-06-04 DIAGNOSIS — X501XXA Overexertion from prolonged static or awkward postures, initial encounter: Secondary | ICD-10-CM | POA: Insufficient documentation

## 2024-06-04 DIAGNOSIS — S8991XA Unspecified injury of right lower leg, initial encounter: Secondary | ICD-10-CM | POA: Diagnosis present

## 2024-06-04 DIAGNOSIS — E119 Type 2 diabetes mellitus without complications: Secondary | ICD-10-CM | POA: Diagnosis not present

## 2024-06-04 DIAGNOSIS — I110 Hypertensive heart disease with heart failure: Secondary | ICD-10-CM | POA: Diagnosis not present

## 2024-06-04 DIAGNOSIS — I5030 Unspecified diastolic (congestive) heart failure: Secondary | ICD-10-CM | POA: Diagnosis not present

## 2024-06-04 DIAGNOSIS — S8391XA Sprain of unspecified site of right knee, initial encounter: Secondary | ICD-10-CM | POA: Insufficient documentation

## 2024-06-04 NOTE — ED Provider Notes (Signed)
   [facility] Provider Note    Event Date/Time   First MD Initiated Contact with Patient 06/04/24 1201     (approximate)   History   Knee Pain   HPI  Laura Escobar is a 43 y.o. female with history of insulin -dependent type 2 diabetes, hypertension, osteomyelitis, diastolic CHF, Addison's disease, gastroparesis and as listed in EMR presents to the emergency department for treatment and evaluation of right knee pain.  While getting out of a car her leg twisted in an awkward way and now anytime she stands up her leg turns to the right and is extremely painful.  No previous knee injury no relief with Goody powder.     Physical Exam    Vitals:   06/04/24 1148  BP: (!) 154/78  Pulse: 90  Resp: 18  Temp: 98.5 F (36.9 C)  SpO2: 98%    General: Awake, no distress.  CV:  Good peripheral perfusion.  Resp:  Normal effort.  Abd:  No distention.  Other:  No obvious swelling of the right knee. No open wounds or lesions.   ED Results / Procedures / Treatments   Labs (all labs ordered are listed, but only abnormal results are displayed)  Labs Reviewed - No data to display   EKG  Not indicated.   RADIOLOGY  Image and radiology report reviewed and interpreted by me. Radiology report consistent with the same.  Imaging of the right knee shows asymmetric widening of the lateral joint space with medial compartment joint space loss which may like changes of underlying joint effusion or osteoarthritis.  No acute fracture or dislocation.  PROCEDURES:  Critical Care performed: No  Procedures   MEDICATIONS ORDERED IN ED:  Medications - No data to display   IMPRESSION / MDM / ASSESSMENT AND PLAN / ED COURSE   I have reviewed the triage note and vital signs. Vital signs are stable   Differential diagnosis includes, but is not limited to, knee strain, knee sprain, fracture, meniscus injury  Patient's presentation is most consistent with acute  illness / injury with system symptoms.  43 year old female presenting to the emergency department for treatment and evaluation of right knee pain ongoing since earlier this month.  Body habitus limits exam however no obvious swelling related to injury.  X-ray shows asymmetric widening of the lateral joint space with medial compartment joint space loss.  Plan will be to try and find a knee immobilizer that fits and provide a walker.  Outpatient follow-up with orthopedics and ER return precautions discussed.      FINAL CLINICAL IMPRESSION(S) / ED DIAGNOSES   Final diagnoses:  Sprain of right knee, unspecified ligament, initial encounter     Rx / DC Orders   ED Discharge Orders     None        Note:  This document was prepared using Dragon voice recognition software and may include unintentional dictation errors.   Herlinda Kirk NOVAK, FNP 06/04/24 1503    Claudene Rover, MD 06/04/24 [phone]

## 2024-06-04 NOTE — ED Notes (Signed)
 Pt received a CAM boot. PA Triplett gave a verbal order for CAM boot before leaving for the day.

## 2024-06-04 NOTE — ED Notes (Signed)
 Bed alarm on, fall risk bracelet on, grip socks on, and call bell near pt

## 2024-06-04 NOTE — ED Notes (Addendum)
 Spoke with charge nurse over secure chat to see if pt qualified for [facility]. Pt did not qualify, per charge. Pt notified and pt stated a ride was coming for her.

## 2024-06-04 NOTE — ED Triage Notes (Addendum)
 First nurse note: Brought in by [facility] from home. C/o right knee pain. 10/10 pain. Reports hurting it while getting out of a car around 4 months a go.  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 History diabetes   EMS vitals: 178/93 b/p 95HR 99% RA 156 CBG  30mg  Toradol IM given by EMS

## 2024-06-04 NOTE — Discharge Instructions (Signed)
 Call and schedule follow-up appointment with orthopedics.  Return to the emergency department for symptoms that change, worsen, or for new concerns if unable to see primary care or the orthopedist.

## 2024-06-04 NOTE — ED Triage Notes (Signed)
 See first nurse note: Patient reports right knee pain since October 4th; states she was getting out of the car and felt pain and has since not been able to ambulate due to pain.
